# Patient Record
Sex: Female | Born: 1955 | Race: White | Hispanic: Yes | Marital: Married | Smoking: Never smoker
Health system: Southern US, Community
[De-identification: ages and names within clinical notes are randomized; demographics above are authoritative.]

## PROBLEM LIST (undated history)

## (undated) DIAGNOSIS — N870 Mild cervical dysplasia: Secondary | ICD-10-CM

## (undated) DIAGNOSIS — R7303 Prediabetes: Secondary | ICD-10-CM

## (undated) DIAGNOSIS — J189 Pneumonia, unspecified organism: Secondary | ICD-10-CM

## (undated) DIAGNOSIS — E039 Hypothyroidism, unspecified: Secondary | ICD-10-CM

## (undated) DIAGNOSIS — D649 Anemia, unspecified: Secondary | ICD-10-CM

## (undated) DIAGNOSIS — K602 Anal fissure, unspecified: Secondary | ICD-10-CM

## (undated) DIAGNOSIS — F32A Depression, unspecified: Secondary | ICD-10-CM

## (undated) DIAGNOSIS — G5600 Carpal tunnel syndrome, unspecified upper limb: Secondary | ICD-10-CM

## (undated) DIAGNOSIS — M8580 Other specified disorders of bone density and structure, unspecified site: Secondary | ICD-10-CM

## (undated) DIAGNOSIS — F329 Major depressive disorder, single episode, unspecified: Secondary | ICD-10-CM

## (undated) DIAGNOSIS — K635 Polyp of colon: Secondary | ICD-10-CM

## (undated) DIAGNOSIS — Z8619 Personal history of other infectious and parasitic diseases: Secondary | ICD-10-CM

## (undated) DIAGNOSIS — F419 Anxiety disorder, unspecified: Secondary | ICD-10-CM

## (undated) DIAGNOSIS — T7840XA Allergy, unspecified, initial encounter: Secondary | ICD-10-CM

## (undated) HISTORY — DX: Mild cervical dysplasia: N87.0

## (undated) HISTORY — DX: Hypothyroidism, unspecified: E03.9

## (undated) HISTORY — DX: Anemia, unspecified: D64.9

## (undated) HISTORY — DX: Polyp of colon: K63.5

## (undated) HISTORY — DX: Depression, unspecified: F32.A

## (undated) HISTORY — DX: Anal fissure, unspecified: K60.2

## (undated) HISTORY — DX: Pneumonia, unspecified organism: J18.9

## (undated) HISTORY — DX: Personal history of other infectious and parasitic diseases: Z86.19

## (undated) HISTORY — DX: Other specified disorders of bone density and structure, unspecified site: M85.80

## (undated) HISTORY — DX: Prediabetes: R73.03

## (undated) HISTORY — DX: Allergy, unspecified, initial encounter: T78.40XA

## (undated) HISTORY — PX: TONSILLECTOMY: SUR1361

## (undated) HISTORY — DX: Major depressive disorder, single episode, unspecified: F32.9

## (undated) HISTORY — DX: Anxiety disorder, unspecified: F41.9

## (undated) HISTORY — PX: APPENDECTOMY: SHX54

## (undated) HISTORY — PX: CRYOTHERAPY: SHX1416

## (undated) HISTORY — DX: Carpal tunnel syndrome, unspecified upper limb: G56.00

---

## 2001-04-07 HISTORY — PX: OTHER SURGICAL HISTORY: SHX169

## 2001-07-27 ENCOUNTER — Other Ambulatory Visit: Admission: RE | Admit: 2001-07-27 | Discharge: 2001-07-27 | Payer: Self-pay | Admitting: Obstetrics and Gynecology

## 2001-08-15 ENCOUNTER — Inpatient Hospital Stay (HOSPITAL_COMMUNITY): Admission: AD | Admit: 2001-08-15 | Discharge: 2001-08-15 | Payer: Self-pay | Admitting: Obstetrics and Gynecology

## 2001-09-14 ENCOUNTER — Encounter: Admission: RE | Admit: 2001-09-14 | Discharge: 2001-09-14 | Payer: Self-pay | Admitting: Obstetrics and Gynecology

## 2001-09-14 ENCOUNTER — Encounter: Payer: Self-pay | Admitting: Obstetrics and Gynecology

## 2001-10-28 ENCOUNTER — Other Ambulatory Visit: Admission: RE | Admit: 2001-10-28 | Discharge: 2001-10-28 | Payer: Self-pay | Admitting: Obstetrics and Gynecology

## 2002-02-10 ENCOUNTER — Other Ambulatory Visit: Admission: RE | Admit: 2002-02-10 | Discharge: 2002-02-10 | Payer: Self-pay | Admitting: Obstetrics and Gynecology

## 2002-05-17 ENCOUNTER — Other Ambulatory Visit: Admission: RE | Admit: 2002-05-17 | Discharge: 2002-05-17 | Payer: Self-pay | Admitting: *Deleted

## 2002-08-31 ENCOUNTER — Other Ambulatory Visit: Admission: RE | Admit: 2002-08-31 | Discharge: 2002-08-31 | Payer: Self-pay | Admitting: Gynecology

## 2002-10-28 ENCOUNTER — Encounter: Admission: RE | Admit: 2002-10-28 | Discharge: 2002-10-28 | Payer: Self-pay | Admitting: Gynecology

## 2002-10-28 ENCOUNTER — Encounter: Payer: Self-pay | Admitting: Gynecology

## 2003-02-10 ENCOUNTER — Other Ambulatory Visit: Admission: RE | Admit: 2003-02-10 | Discharge: 2003-02-10 | Payer: Self-pay | Admitting: Gynecology

## 2003-08-16 ENCOUNTER — Ambulatory Visit (HOSPITAL_BASED_OUTPATIENT_CLINIC_OR_DEPARTMENT_OTHER): Admission: RE | Admit: 2003-08-16 | Discharge: 2003-08-16 | Payer: Self-pay | Admitting: Gynecology

## 2003-08-16 ENCOUNTER — Encounter (INDEPENDENT_AMBULATORY_CARE_PROVIDER_SITE_OTHER): Payer: Self-pay | Admitting: Specialist

## 2003-08-16 ENCOUNTER — Ambulatory Visit (HOSPITAL_COMMUNITY): Admission: RE | Admit: 2003-08-16 | Discharge: 2003-08-16 | Payer: Self-pay | Admitting: Gynecology

## 2003-11-21 ENCOUNTER — Ambulatory Visit (HOSPITAL_COMMUNITY): Admission: RE | Admit: 2003-11-21 | Discharge: 2003-11-21 | Payer: Self-pay | Admitting: Gynecology

## 2004-02-14 ENCOUNTER — Other Ambulatory Visit: Admission: RE | Admit: 2004-02-14 | Discharge: 2004-02-14 | Payer: Self-pay | Admitting: Gynecology

## 2004-11-20 ENCOUNTER — Ambulatory Visit (HOSPITAL_COMMUNITY): Admission: RE | Admit: 2004-11-20 | Discharge: 2004-11-20 | Payer: Self-pay | Admitting: Gynecology

## 2004-12-25 ENCOUNTER — Other Ambulatory Visit: Admission: RE | Admit: 2004-12-25 | Discharge: 2004-12-25 | Payer: Self-pay | Admitting: Gynecology

## 2005-03-03 ENCOUNTER — Other Ambulatory Visit: Admission: RE | Admit: 2005-03-03 | Discharge: 2005-03-03 | Payer: Self-pay | Admitting: Gynecology

## 2005-03-26 ENCOUNTER — Ambulatory Visit (HOSPITAL_COMMUNITY): Admission: RE | Admit: 2005-03-26 | Discharge: 2005-03-26 | Payer: Self-pay | Admitting: Gastroenterology

## 2005-03-26 ENCOUNTER — Encounter (INDEPENDENT_AMBULATORY_CARE_PROVIDER_SITE_OTHER): Payer: Self-pay | Admitting: *Deleted

## 2005-03-26 LAB — HM COLONOSCOPY: HM Colonoscopy: ABNORMAL

## 2005-11-21 ENCOUNTER — Ambulatory Visit (HOSPITAL_COMMUNITY): Admission: RE | Admit: 2005-11-21 | Discharge: 2005-11-21 | Payer: Self-pay | Admitting: Gynecology

## 2006-03-04 ENCOUNTER — Other Ambulatory Visit: Admission: RE | Admit: 2006-03-04 | Discharge: 2006-03-04 | Payer: Self-pay | Admitting: Gynecology

## 2006-03-27 ENCOUNTER — Ambulatory Visit: Payer: Self-pay | Admitting: Family Medicine

## 2006-03-27 LAB — CONVERTED CEMR LAB
ALT: 40 units/L (ref 0–40)
AST: 26 units/L (ref 0–37)
Albumin: 4 g/dL (ref 3.5–5.2)
Alkaline Phosphatase: 56 units/L (ref 39–117)
BUN: 5 mg/dL — ABNORMAL LOW (ref 6–23)
Calcium: 9.1 mg/dL (ref 8.4–10.5)
Chloride: 105 meq/L (ref 96–112)
GFR calc non Af Amer: 113 mL/min
Glomerular Filtration Rate, Af Am: 137 mL/min/{1.73_m2}
Glucose, Bld: 92 mg/dL (ref 70–99)
HCT: 43.6 % (ref 36.0–46.0)
MCV: 98.3 fL (ref 78.0–100.0)
Monocytes Absolute: 0.5 10*3/uL (ref 0.2–0.7)
Neutro Abs: 3.3 10*3/uL (ref 1.4–7.7)
Neutrophils Relative %: 48.6 % (ref 43.0–77.0)
Rheumatoid Fact: <20 intl units/mL — ABNORMAL LOW (ref 0.0–20.0)
Sed Rate: 11 mm/hr (ref 0–25)
Sodium: 135 meq/L (ref 135–145)

## 2006-04-27 ENCOUNTER — Ambulatory Visit: Payer: Self-pay | Admitting: Family Medicine

## 2006-07-02 ENCOUNTER — Ambulatory Visit: Payer: Self-pay | Admitting: Family Medicine

## 2006-07-28 ENCOUNTER — Ambulatory Visit: Payer: Self-pay | Admitting: Family Medicine

## 2006-09-15 ENCOUNTER — Telehealth (INDEPENDENT_AMBULATORY_CARE_PROVIDER_SITE_OTHER): Payer: Self-pay | Admitting: *Deleted

## 2006-10-06 ENCOUNTER — Telehealth (INDEPENDENT_AMBULATORY_CARE_PROVIDER_SITE_OTHER): Payer: Self-pay | Admitting: *Deleted

## 2006-10-27 ENCOUNTER — Telehealth (INDEPENDENT_AMBULATORY_CARE_PROVIDER_SITE_OTHER): Payer: Self-pay | Admitting: *Deleted

## 2006-10-28 ENCOUNTER — Ambulatory Visit: Payer: Self-pay | Admitting: Family Medicine

## 2006-10-28 DIAGNOSIS — F329 Major depressive disorder, single episode, unspecified: Secondary | ICD-10-CM

## 2006-10-28 DIAGNOSIS — F419 Anxiety disorder, unspecified: Secondary | ICD-10-CM | POA: Insufficient documentation

## 2006-10-30 ENCOUNTER — Telehealth (INDEPENDENT_AMBULATORY_CARE_PROVIDER_SITE_OTHER): Payer: Self-pay | Admitting: *Deleted

## 2006-11-23 LAB — HM MAMMOGRAPHY: HM Mammogram: NORMAL

## 2006-11-25 ENCOUNTER — Ambulatory Visit (HOSPITAL_COMMUNITY): Admission: RE | Admit: 2006-11-25 | Discharge: 2006-11-25 | Payer: Self-pay | Admitting: Gynecology

## 2007-02-03 ENCOUNTER — Ambulatory Visit: Payer: Self-pay | Admitting: Family Medicine

## 2007-02-03 ENCOUNTER — Telehealth (INDEPENDENT_AMBULATORY_CARE_PROVIDER_SITE_OTHER): Payer: Self-pay | Admitting: *Deleted

## 2007-02-03 DIAGNOSIS — E039 Hypothyroidism, unspecified: Secondary | ICD-10-CM

## 2007-02-03 LAB — CONVERTED CEMR LAB: Glucose, Bld: 90 mg/dL (ref 70–99)

## 2007-02-11 ENCOUNTER — Other Ambulatory Visit: Admission: RE | Admit: 2007-02-11 | Discharge: 2007-02-11 | Payer: Self-pay | Admitting: Gynecology

## 2007-02-22 ENCOUNTER — Ambulatory Visit: Payer: Self-pay | Admitting: Family Medicine

## 2007-02-22 ENCOUNTER — Encounter (INDEPENDENT_AMBULATORY_CARE_PROVIDER_SITE_OTHER): Payer: Self-pay | Admitting: *Deleted

## 2007-03-03 ENCOUNTER — Ambulatory Visit: Payer: Self-pay | Admitting: Family Medicine

## 2007-03-03 DIAGNOSIS — B189 Chronic viral hepatitis, unspecified: Secondary | ICD-10-CM | POA: Insufficient documentation

## 2007-03-03 LAB — CONVERTED CEMR LAB
ALT: 17 units/L (ref 0–35)
AST: 17 units/L (ref 0–37)
Albumin: 3.9 g/dL (ref 3.5–5.2)
Total Bilirubin: 1.3 mg/dL — ABNORMAL HIGH (ref 0.3–1.2)
Total Protein: 7.4 g/dL (ref 6.0–8.3)

## 2007-03-08 ENCOUNTER — Telehealth (INDEPENDENT_AMBULATORY_CARE_PROVIDER_SITE_OTHER): Payer: Self-pay | Admitting: *Deleted

## 2007-03-08 LAB — CONVERTED CEMR LAB
HCV Ab: NEGATIVE
Hep B C IgM: NEGATIVE
Hep B Core Total Ab: NEGATIVE
Hep B S Ab: NEGATIVE
Hepatitis B Surface Ag: NEGATIVE

## 2007-04-26 ENCOUNTER — Telehealth (INDEPENDENT_AMBULATORY_CARE_PROVIDER_SITE_OTHER): Payer: Self-pay | Admitting: *Deleted

## 2007-05-08 ENCOUNTER — Ambulatory Visit: Payer: Self-pay | Admitting: Family Medicine

## 2007-05-10 ENCOUNTER — Telehealth (INDEPENDENT_AMBULATORY_CARE_PROVIDER_SITE_OTHER): Payer: Self-pay | Admitting: *Deleted

## 2007-05-10 ENCOUNTER — Encounter (INDEPENDENT_AMBULATORY_CARE_PROVIDER_SITE_OTHER): Payer: Self-pay | Admitting: *Deleted

## 2007-05-24 ENCOUNTER — Ambulatory Visit: Payer: Self-pay | Admitting: Family Medicine

## 2007-08-04 ENCOUNTER — Ambulatory Visit: Payer: Self-pay | Admitting: Internal Medicine

## 2007-08-04 DIAGNOSIS — R209 Unspecified disturbances of skin sensation: Secondary | ICD-10-CM

## 2007-08-04 DIAGNOSIS — J309 Allergic rhinitis, unspecified: Secondary | ICD-10-CM | POA: Insufficient documentation

## 2007-08-06 LAB — CONVERTED CEMR LAB
BUN: 5 mg/dL — ABNORMAL LOW (ref 6–23)
Basophils Absolute: 0 10*3/uL (ref 0.0–0.1)
Basophils Relative: 0.4 % (ref 0.0–1.0)
CO2: 29 meq/L (ref 19–32)
Calcium: 9 mg/dL (ref 8.4–10.5)
Chloride: 104 meq/L (ref 96–112)
Eosinophils Relative: 6.5 % — ABNORMAL HIGH (ref 0.0–5.0)
GFR calc Af Amer: 114 mL/min
Hemoglobin: 13.8 g/dL (ref 12.0–15.0)
Monocytes Absolute: 0.4 10*3/uL (ref 0.1–1.0)
Neutro Abs: 3.1 10*3/uL (ref 1.4–7.7)
Neutrophils Relative %: 50.9 % (ref 43.0–77.0)
Potassium: 4.4 meq/L (ref 3.5–5.1)
Rhuematoid fact SerPl-aCnc: <20 intl units/mL — ABNORMAL LOW (ref 0.0–20.0)
Sodium: 138 meq/L (ref 135–145)

## 2007-10-18 ENCOUNTER — Ambulatory Visit: Payer: Self-pay | Admitting: Internal Medicine

## 2007-10-18 ENCOUNTER — Telehealth (INDEPENDENT_AMBULATORY_CARE_PROVIDER_SITE_OTHER): Payer: Self-pay | Admitting: *Deleted

## 2007-10-18 LAB — CONVERTED CEMR LAB
Glucose, Urine, Semiquant: NEGATIVE
Nitrite: NEGATIVE
Protein, U semiquant: NEGATIVE
Specific Gravity, Urine: 1.025
Urobilinogen, UA: 0.2
WBC Urine, dipstick: NEGATIVE

## 2007-10-19 ENCOUNTER — Encounter: Payer: Self-pay | Admitting: Internal Medicine

## 2007-11-01 ENCOUNTER — Ambulatory Visit: Payer: Self-pay | Admitting: Internal Medicine

## 2007-11-01 ENCOUNTER — Encounter (INDEPENDENT_AMBULATORY_CARE_PROVIDER_SITE_OTHER): Payer: Self-pay | Admitting: *Deleted

## 2007-11-22 ENCOUNTER — Telehealth (INDEPENDENT_AMBULATORY_CARE_PROVIDER_SITE_OTHER): Payer: Self-pay | Admitting: *Deleted

## 2007-11-26 ENCOUNTER — Ambulatory Visit (HOSPITAL_COMMUNITY): Admission: RE | Admit: 2007-11-26 | Discharge: 2007-11-26 | Payer: Self-pay | Admitting: Gynecology

## 2007-11-26 ENCOUNTER — Telehealth (INDEPENDENT_AMBULATORY_CARE_PROVIDER_SITE_OTHER): Payer: Self-pay | Admitting: *Deleted

## 2007-11-26 ENCOUNTER — Ambulatory Visit: Payer: Self-pay | Admitting: Internal Medicine

## 2007-11-26 DIAGNOSIS — G4720 Circadian rhythm sleep disorder, unspecified type: Secondary | ICD-10-CM | POA: Insufficient documentation

## 2007-11-29 ENCOUNTER — Telehealth (INDEPENDENT_AMBULATORY_CARE_PROVIDER_SITE_OTHER): Payer: Self-pay | Admitting: *Deleted

## 2008-02-14 ENCOUNTER — Ambulatory Visit: Payer: Self-pay | Admitting: Gynecology

## 2008-02-15 ENCOUNTER — Telehealth (INDEPENDENT_AMBULATORY_CARE_PROVIDER_SITE_OTHER): Payer: Self-pay | Admitting: *Deleted

## 2008-02-16 ENCOUNTER — Ambulatory Visit (HOSPITAL_COMMUNITY): Admission: RE | Admit: 2008-02-16 | Discharge: 2008-02-16 | Payer: Self-pay | Admitting: Gynecology

## 2008-02-16 ENCOUNTER — Other Ambulatory Visit: Admission: RE | Admit: 2008-02-16 | Discharge: 2008-02-16 | Payer: Self-pay | Admitting: Gynecology

## 2008-02-16 ENCOUNTER — Ambulatory Visit: Payer: Self-pay | Admitting: Gynecology

## 2008-02-16 ENCOUNTER — Encounter: Payer: Self-pay | Admitting: Gynecology

## 2008-02-29 ENCOUNTER — Telehealth: Payer: Self-pay | Admitting: Internal Medicine

## 2008-03-10 ENCOUNTER — Telehealth (INDEPENDENT_AMBULATORY_CARE_PROVIDER_SITE_OTHER): Payer: Self-pay | Admitting: *Deleted

## 2008-03-14 ENCOUNTER — Telehealth (INDEPENDENT_AMBULATORY_CARE_PROVIDER_SITE_OTHER): Payer: Self-pay | Admitting: *Deleted

## 2008-03-14 ENCOUNTER — Encounter (INDEPENDENT_AMBULATORY_CARE_PROVIDER_SITE_OTHER): Payer: Self-pay | Admitting: *Deleted

## 2008-03-15 ENCOUNTER — Ambulatory Visit: Payer: Self-pay | Admitting: Internal Medicine

## 2008-03-15 DIAGNOSIS — M25559 Pain in unspecified hip: Secondary | ICD-10-CM

## 2008-04-07 HISTORY — PX: CHOLECYSTECTOMY: SHX55

## 2008-04-13 ENCOUNTER — Telehealth: Payer: Self-pay | Admitting: Internal Medicine

## 2008-04-14 ENCOUNTER — Telehealth (INDEPENDENT_AMBULATORY_CARE_PROVIDER_SITE_OTHER): Payer: Self-pay | Admitting: *Deleted

## 2008-04-18 ENCOUNTER — Encounter: Payer: Self-pay | Admitting: Internal Medicine

## 2008-04-20 ENCOUNTER — Telehealth (INDEPENDENT_AMBULATORY_CARE_PROVIDER_SITE_OTHER): Payer: Self-pay | Admitting: *Deleted

## 2008-04-21 ENCOUNTER — Ambulatory Visit: Payer: Self-pay | Admitting: Internal Medicine

## 2008-04-22 ENCOUNTER — Encounter: Payer: Self-pay | Admitting: Internal Medicine

## 2008-06-29 ENCOUNTER — Telehealth: Payer: Self-pay | Admitting: Internal Medicine

## 2008-07-14 ENCOUNTER — Ambulatory Visit: Payer: Self-pay | Admitting: Internal Medicine

## 2008-07-19 ENCOUNTER — Telehealth: Payer: Self-pay | Admitting: Internal Medicine

## 2008-09-05 ENCOUNTER — Ambulatory Visit: Payer: Self-pay | Admitting: Gynecology

## 2008-09-27 ENCOUNTER — Ambulatory Visit: Payer: Self-pay | Admitting: Internal Medicine

## 2008-09-28 ENCOUNTER — Encounter (INDEPENDENT_AMBULATORY_CARE_PROVIDER_SITE_OTHER): Payer: Self-pay | Admitting: *Deleted

## 2008-09-28 LAB — CONVERTED CEMR LAB: TSH: 2.95 u[IU]/mL

## 2008-11-02 ENCOUNTER — Ambulatory Visit: Payer: Self-pay | Admitting: Internal Medicine

## 2008-11-02 DIAGNOSIS — Z8719 Personal history of other diseases of the digestive system: Secondary | ICD-10-CM

## 2008-11-02 DIAGNOSIS — E739 Lactose intolerance, unspecified: Secondary | ICD-10-CM

## 2008-11-06 ENCOUNTER — Encounter: Admission: RE | Admit: 2008-11-06 | Discharge: 2008-11-06 | Payer: Self-pay | Admitting: Internal Medicine

## 2008-11-07 ENCOUNTER — Telehealth (INDEPENDENT_AMBULATORY_CARE_PROVIDER_SITE_OTHER): Payer: Self-pay | Admitting: *Deleted

## 2008-11-07 ENCOUNTER — Encounter (INDEPENDENT_AMBULATORY_CARE_PROVIDER_SITE_OTHER): Payer: Self-pay | Admitting: *Deleted

## 2008-11-07 LAB — CONVERTED CEMR LAB
Amylase: 117 units/L (ref 27–131)
Basophils Absolute: 0 10*3/uL (ref 0.0–0.1)
Bilirubin, Direct: 0.2 mg/dL (ref 0.0–0.3)
Eosinophils Absolute: 0.2 10*3/uL (ref 0.0–0.7)
HCT: 38.1 % (ref 36.0–46.0)
Lipase: 15 units/L (ref 11.0–59.0)
Lymphs Abs: 1.4 10*3/uL (ref 0.7–4.0)
MCV: 96.7 fL (ref 78.0–100.0)
Monocytes Absolute: 0.2 10*3/uL (ref 0.1–1.0)
Neutrophils Relative %: 28.4 % — ABNORMAL LOW (ref 43.0–77.0)
Platelets: 199 10*3/uL (ref 150.0–400.0)
RDW: 13.7 % (ref 11.5–14.6)
Total Bilirubin: 1.1 mg/dL (ref 0.3–1.2)

## 2008-11-08 ENCOUNTER — Telehealth (INDEPENDENT_AMBULATORY_CARE_PROVIDER_SITE_OTHER): Payer: Self-pay | Admitting: *Deleted

## 2008-11-08 ENCOUNTER — Telehealth: Payer: Self-pay | Admitting: Internal Medicine

## 2008-11-20 ENCOUNTER — Encounter: Payer: Self-pay | Admitting: Internal Medicine

## 2008-11-22 ENCOUNTER — Telehealth: Payer: Self-pay | Admitting: Internal Medicine

## 2008-11-29 ENCOUNTER — Ambulatory Visit (HOSPITAL_COMMUNITY): Admission: RE | Admit: 2008-11-29 | Discharge: 2008-11-29 | Payer: Self-pay | Admitting: Gynecology

## 2008-11-29 ENCOUNTER — Encounter: Payer: Self-pay | Admitting: Internal Medicine

## 2008-12-04 ENCOUNTER — Telehealth (INDEPENDENT_AMBULATORY_CARE_PROVIDER_SITE_OTHER): Payer: Self-pay | Admitting: *Deleted

## 2009-01-18 ENCOUNTER — Encounter (INDEPENDENT_AMBULATORY_CARE_PROVIDER_SITE_OTHER): Payer: Self-pay | Admitting: Surgery

## 2009-01-18 ENCOUNTER — Ambulatory Visit (HOSPITAL_COMMUNITY): Admission: RE | Admit: 2009-01-18 | Discharge: 2009-01-18 | Payer: Self-pay | Admitting: Surgery

## 2009-02-15 ENCOUNTER — Encounter: Payer: Self-pay | Admitting: Internal Medicine

## 2009-02-15 ENCOUNTER — Other Ambulatory Visit: Admission: RE | Admit: 2009-02-15 | Discharge: 2009-02-15 | Payer: Self-pay | Admitting: Gynecology

## 2009-02-15 ENCOUNTER — Ambulatory Visit: Payer: Self-pay | Admitting: Gynecology

## 2009-02-15 ENCOUNTER — Encounter: Payer: Self-pay | Admitting: Gynecology

## 2009-03-07 ENCOUNTER — Encounter: Payer: Self-pay | Admitting: Internal Medicine

## 2009-03-12 ENCOUNTER — Telehealth (INDEPENDENT_AMBULATORY_CARE_PROVIDER_SITE_OTHER): Payer: Self-pay | Admitting: *Deleted

## 2009-03-21 ENCOUNTER — Emergency Department (HOSPITAL_COMMUNITY): Admission: EM | Admit: 2009-03-21 | Discharge: 2009-03-21 | Payer: Self-pay | Admitting: Emergency Medicine

## 2009-03-27 ENCOUNTER — Telehealth (INDEPENDENT_AMBULATORY_CARE_PROVIDER_SITE_OTHER): Payer: Self-pay | Admitting: *Deleted

## 2009-05-02 ENCOUNTER — Ambulatory Visit: Payer: Self-pay | Admitting: Gynecology

## 2009-05-28 ENCOUNTER — Telehealth (INDEPENDENT_AMBULATORY_CARE_PROVIDER_SITE_OTHER): Payer: Self-pay | Admitting: *Deleted

## 2009-07-13 ENCOUNTER — Encounter: Admission: RE | Admit: 2009-07-13 | Discharge: 2009-07-13 | Payer: Self-pay | Admitting: Neurosurgery

## 2009-09-05 ENCOUNTER — Ambulatory Visit: Payer: Self-pay | Admitting: Internal Medicine

## 2009-09-05 DIAGNOSIS — G4700 Insomnia, unspecified: Secondary | ICD-10-CM

## 2009-09-05 DIAGNOSIS — M549 Dorsalgia, unspecified: Secondary | ICD-10-CM

## 2009-09-06 ENCOUNTER — Ambulatory Visit: Payer: Self-pay | Admitting: Family Medicine

## 2009-09-06 ENCOUNTER — Telehealth (INDEPENDENT_AMBULATORY_CARE_PROVIDER_SITE_OTHER): Payer: Self-pay | Admitting: *Deleted

## 2009-09-06 LAB — CONVERTED CEMR LAB
Albumin: 4.2 g/dL (ref 3.5–5.2)
Alkaline Phosphatase: 70 units/L (ref 39–117)
Calcium: 9.3 mg/dL (ref 8.4–10.5)
Cholesterol: 195 mg/dL (ref 0–200)
Eosinophils Absolute: 0.1 10*3/uL (ref 0.0–0.7)
Eosinophils Relative: 2.6 % (ref 0.0–5.0)
GFR calc non Af Amer: 109.12 mL/min (ref >60)
Glucose, Bld: 93 mg/dL (ref 70–99)
HCT: 38.7 % (ref 36.0–46.0)
HDL: 36.1 mg/dL — ABNORMAL LOW (ref >39.00)
Lymphs Abs: 2.5 10*3/uL (ref 0.7–4.0)
MCHC: 34.3 g/dL (ref 30.0–36.0)
MCV: 95.2 fL (ref 78.0–100.0)
Monocytes Absolute: 0.4 10*3/uL (ref 0.1–1.0)
Platelets: 183 10*3/uL (ref 150.0–400.0)
RDW: 13.5 % (ref 11.5–14.6)
Sodium: 142 meq/L (ref 135–145)
TSH: 2.2 microintl units/mL (ref 0.35–5.50)
Total Bilirubin: 1.2 mg/dL (ref 0.3–1.2)
Triglycerides: 89 mg/dL (ref 0.0–149.0)
WBC: 5.5 10*3/uL (ref 4.5–10.5)

## 2009-09-11 ENCOUNTER — Ambulatory Visit: Payer: Self-pay | Admitting: Internal Medicine

## 2009-09-12 ENCOUNTER — Telehealth: Payer: Self-pay | Admitting: Internal Medicine

## 2009-10-16 ENCOUNTER — Ambulatory Visit: Payer: Self-pay | Admitting: Internal Medicine

## 2009-10-19 LAB — CONVERTED CEMR LAB
Potassium: 5.2 meq/L — ABNORMAL HIGH (ref 3.5–5.1)
TSH: 1.12 microintl units/mL (ref 0.35–5.50)

## 2009-10-26 ENCOUNTER — Telehealth: Payer: Self-pay | Admitting: Internal Medicine

## 2009-11-28 ENCOUNTER — Ambulatory Visit: Payer: Self-pay | Admitting: Internal Medicine

## 2009-11-30 ENCOUNTER — Ambulatory Visit (HOSPITAL_COMMUNITY): Admission: RE | Admit: 2009-11-30 | Discharge: 2009-11-30 | Payer: Self-pay | Admitting: Gynecology

## 2010-01-02 ENCOUNTER — Emergency Department (HOSPITAL_COMMUNITY): Admission: EM | Admit: 2010-01-02 | Discharge: 2010-01-02 | Payer: Self-pay | Admitting: Emergency Medicine

## 2010-01-17 ENCOUNTER — Ambulatory Visit: Payer: Self-pay | Admitting: Family Medicine

## 2010-01-18 ENCOUNTER — Telehealth: Payer: Self-pay | Admitting: Internal Medicine

## 2010-02-14 ENCOUNTER — Ambulatory Visit: Payer: Self-pay | Admitting: Internal Medicine

## 2010-02-15 LAB — CONVERTED CEMR LAB
Calcium: 9.6 mg/dL (ref 8.4–10.5)
Chloride: 107 meq/L (ref 96–112)
Creatinine, Ser: 0.6 mg/dL (ref 0.4–1.2)

## 2010-02-21 ENCOUNTER — Ambulatory Visit: Payer: Self-pay | Admitting: Gynecology

## 2010-02-21 ENCOUNTER — Other Ambulatory Visit: Admission: RE | Admit: 2010-02-21 | Discharge: 2010-02-21 | Payer: Self-pay | Admitting: Gynecology

## 2010-02-21 ENCOUNTER — Telehealth: Payer: Self-pay | Admitting: Internal Medicine

## 2010-04-02 ENCOUNTER — Ambulatory Visit: Payer: Self-pay | Admitting: Gynecology

## 2010-04-03 ENCOUNTER — Ambulatory Visit
Admission: RE | Admit: 2010-04-03 | Discharge: 2010-04-03 | Payer: Self-pay | Source: Home / Self Care | Attending: Gynecology | Admitting: Gynecology

## 2010-04-24 ENCOUNTER — Encounter: Payer: Self-pay | Admitting: Internal Medicine

## 2010-04-25 ENCOUNTER — Emergency Department (HOSPITAL_COMMUNITY)
Admission: EM | Admit: 2010-04-25 | Discharge: 2010-04-25 | Payer: Self-pay | Source: Home / Self Care | Admitting: Emergency Medicine

## 2010-04-25 ENCOUNTER — Encounter: Payer: Self-pay | Admitting: Internal Medicine

## 2010-04-28 ENCOUNTER — Encounter: Payer: Self-pay | Admitting: Gynecology

## 2010-04-28 ENCOUNTER — Encounter: Payer: Self-pay | Admitting: Neurosurgery

## 2010-04-29 ENCOUNTER — Encounter: Payer: Self-pay | Admitting: Internal Medicine

## 2010-04-29 ENCOUNTER — Ambulatory Visit: Payer: Self-pay | Admitting: Cardiology

## 2010-04-29 ENCOUNTER — Ambulatory Visit
Admission: RE | Admit: 2010-04-29 | Discharge: 2010-04-29 | Payer: Self-pay | Source: Home / Self Care | Attending: Internal Medicine | Admitting: Internal Medicine

## 2010-04-29 DIAGNOSIS — R42 Dizziness and giddiness: Secondary | ICD-10-CM | POA: Insufficient documentation

## 2010-04-29 LAB — URINALYSIS, ROUTINE W REFLEX MICROSCOPIC
Ketones, ur: NEGATIVE mg/dL
Nitrite: NEGATIVE
Protein, ur: NEGATIVE mg/dL
Specific Gravity, Urine: 1.006 (ref 1.005–1.030)
Urine Glucose, Fasting: NEGATIVE mg/dL
Urobilinogen, UA: 0.2 mg/dL (ref 0.0–1.0)

## 2010-04-29 LAB — DIFFERENTIAL
Basophils Relative: 1 % (ref 0–1)
Eosinophils Absolute: 0.1 10*3/uL (ref 0.0–0.7)
Lymphocytes Relative: 40 % (ref 12–46)
Lymphs Abs: 2.4 10*3/uL (ref 0.7–4.0)
Neutro Abs: 3 10*3/uL (ref 1.7–7.7)
Neutrophils Relative %: 51 % (ref 43–77)

## 2010-04-29 LAB — COMPREHENSIVE METABOLIC PANEL
ALT: 21 U/L (ref 0–35)
Albumin: 4 g/dL (ref 3.5–5.2)
CO2: 26 mEq/L (ref 19–32)
Calcium: 9.3 mg/dL (ref 8.4–10.5)
Creatinine, Ser: 0.56 mg/dL (ref 0.4–1.2)
GFR calc Af Amer: >60 mL/min (ref >60)
GFR calc non Af Amer: >60 mL/min (ref >60)
Total Bilirubin: 1 mg/dL (ref 0.3–1.2)

## 2010-04-29 LAB — TROPONIN I: Troponin I: 0.02 ng/mL (ref 0.00–0.06)

## 2010-04-29 LAB — CBC
HCT: 39.5 % (ref 36.0–46.0)
Platelets: 209 10*3/uL (ref 150–400)
RBC: 4.23 MIL/uL (ref 3.87–5.11)

## 2010-04-29 LAB — CK TOTAL AND CKMB (NOT AT ARMC): Relative Index: 1.1 (ref 0.0–2.5)

## 2010-04-30 ENCOUNTER — Telehealth: Payer: Self-pay | Admitting: Internal Medicine

## 2010-05-07 NOTE — Assessment & Plan Note (Signed)
Summary: TO DISCUSS POTASSIUM AND KIDNEY ISSUES////SPH   Vital Signs:  Patient profile:   55 year old female Weight:      138.38 pounds Pulse rate:   71 / minute Pulse rhythm:   regular BP sitting:   116 / 74  (left arm) Cuff size:   regular  Vitals Entered By: Army Fossa CMA (November 28, 2009 3:47 PM) CC: Pt here to discuss Potassium (high in the past), also discuss Kidney functions.    History of Present Illness: multiple issues Very anxious and  concerned about the health of her husband (he is my patient as well) Concern about her history of high potassium and wonders about her kidney function Having again numbness in the left hand, in the past it responded well to the use of a carpal tunnel splinter Alprazolam helped  with insomnia, refill ? Occasional pain at the right third finger, DJD?   Current Medications (verified): 1)  Citalopram Hydrobromide 40 Mg Tabs (Citalopram Hydrobromide) .Marland Kitchen.. 1 Once Daily 2)  Levothyroxine Sodium 50 Mcg Tabs (Levothyroxine Sodium) .... Take 1 Tab Once Daily 3)  Calcium Antacid Ultra 1000 Mg  Chew (Calcium Carbonate Antacid) 4)  Alprazolam 0.5 Mg Tabs (Alprazolam) .... One or 2 By Mouth At Bedtime As Needed For Insomnia  Allergies: 1)  ! Sulfa 2)  ! Ceftriaxone Sodium (Ceftriaxone Sodium)  Past History:  Past Medical History: Reviewed history from 09/27/2008 and no changes required. Depression Hypothyroidism Allergic rhinitis CTS  Past Surgical History: Reviewed history from 09/05/2009 and no changes required. Cholecystectomy Tonsillectomy uterine polyps aprox 2003 , Dr Lily Peer   Family History: DM-- uncle, brother, GM MI--no Colon ca-- no breast  ca-- distant cousins  Willms tumor-- niece  Social History: Reviewed history from 09/05/2009 and no changes required. original from [place] no children  married at age 3 no tobacco no ETOH  Physical Exam  General:  alert and well-developed.   Extremities:  hands and  wrists inspection and palpation normal except for mild enlargement of the DIP joints   Impression & Recommendations:  Problem # 1:  INSOMNIA-SLEEP DISORDER-UNSPEC (ICD-780.52) refill alprazolam  Problem # 2:  PARESTHESIA, HANDS (ICD-782.0) retry carpal tunnel syndrome splinter  Problem # 3:  DEPRESSION (ICD-311) history of depression and now also anxiety long discussion and counseling  in reference to her husband's health Her updated medication list for this problem includes:    Citalopram Hydrobromide 40 Mg Tabs (Citalopram hydrobromide) .Marland Kitchen... 1 once daily    Alprazolam 0.5 Mg Tabs (Alprazolam) ..... One or 2 by mouth at bedtime as needed for insomnia  Problem # 4:  HYPERKALEMIA (ICD-276.7) all  labs discussed plan to recheck her potassium in December  Problem # 5:  face to face more than 20 minutes, > 50% of the time counseling  Complete Medication List: 1)  Citalopram Hydrobromide 40 Mg Tabs (Citalopram hydrobromide) .Marland Kitchen.. 1 once daily 2)  Levothyroxine Sodium 50 Mcg Tabs (Levothyroxine sodium) .... Take 1 tab once daily 3)  Calcium Antacid Ultra 1000 Mg Chew (Calcium carbonate antacid) 4)  Alprazolam 0.5 Mg Tabs (Alprazolam) .... One or 2 by mouth at bedtime as needed for insomnia Prescriptions: ALPRAZOLAM 0.5 MG TABS (ALPRAZOLAM) one or 2 by mouth at bedtime as needed for insomnia  #60 x 1   Entered and Authorized by:   Elita Quick E. Paz MD   Signed by:   Nolon Rod. Paz MD on 11/28/2009   Method used:   Print then Give to Patient  RxID:   [id number]

## 2010-05-07 NOTE — Assessment & Plan Note (Signed)
Summary: followup/kn   Vital Signs:  Patient profile:   55 year old female Weight:      136.50 pounds Pulse rate:   84 / minute Pulse rhythm:   regular BP sitting:   130 / 80  (left arm) Cuff size:   regular  Vitals Entered By: Army Fossa CMA (February 14, 2010 8:45 AM) CC: Follow up visit-fasting Comments Abilify not helping for her depression.  [facility] [address]    History of Present Illness: followup from last office visit, she was seen with increased depression and anxiety. She was given samples of Abilify which she took for several days. She cannot tolerate Abilify , she felt "hyperactive", couldn't sleep.  Review of systems  she continue with increase anxiety and depression. On describing  her symptoms , she feels down, sad. No crying, not suicidal Her main worry remains her husband 's health of her stressful job. She has made some mistakes at work and is worried about losing her job   Current Medications (verified): 1)  Celexa 40 Mg Tabs (Citalopram Hydrobromide) .Marland Kitchen.. 1 By Mouth Once Daily 2)  Levothyroxine Sodium 50 Mcg Tabs (Levothyroxine Sodium) .... Take 1 Tab Once Daily 3)  Calcium Antacid Ultra 1000 Mg  Chew (Calcium Carbonate Antacid) 4)  Alprazolam 0.5 Mg Tabs (Alprazolam) .... One or 2 By Mouth At Bedtime As Needed For Insomnia 5)  Elestrin 0.52 Mg/0.87 Gm (0.06%) Gel (Estradiol)  Allergies (verified): 1)  ! Sulfa 2)  ! Ceftriaxone Sodium (Ceftriaxone Sodium)  Past History:  Past Medical History: Reviewed history from 09/27/2008 and no changes required. Depression Hypothyroidism Allergic rhinitis CTS  Past Surgical History: Reviewed history from 09/05/2009 and no changes required. Cholecystectomy Tonsillectomy uterine polyps aprox 2003 , Dr Lily Peer   Social History: Reviewed history from 09/05/2009 and no changes required. original from [place] no children  married at age 79 no tobacco no ETOH  Physical Exam  General:  alert and  well-developed.   Psych:  Oriented X3, normally interactive, good eye contact, and not anxious appearing.  seems moderately anxious, no depressed   Impression & Recommendations:  Problem # 1:  DEPRESSION (ICD-311) continue with depression and anxiety, intolerant to Abilify She was referred to psychiatry and has an appointment with Dr. Evelene Croon March 16, 2010 In the meantime I feel that she needs help. I counseled her for more than 25 minutes today, explained that no medication will help her husband's health or her stressful job but medication and counseling will improve the way she deals with problems states she is unable to see a counselor due to lack of time  Plan letter to psych  Switch from Celexa to Zoloft ( because she is feeling " down" and not motivated to do much, so zoloft may work better for her) keep appointment with Dr. Evelene Croon  The following medications were removed from the medication list:    Celexa 40 Mg Tabs (Citalopram hydrobromide) .Marland Kitchen... 1 by mouth once daily Her updated medication list for this problem includes:    Alprazolam 0.5 Mg Tabs (Alprazolam) ..... One or 2 by mouth at bedtime as needed for insomnia    Sertraline Hcl 100 Mg Tabs (Sertraline hcl) .Marland Kitchen... As  directed  Problem # 2:  HYPERKALEMIA (ICD-276.7)  labs  Orders: Venipuncture (91478) Specimen Handling (29562) TLB-BMP (Basic Metabolic Panel-BMET) (80048-METABOL)  Problem # 3:  F2F >> 25 min, > 50% of time counseling   Complete Medication List: 1)  Levothyroxine Sodium 50 Mcg Tabs (Levothyroxine sodium) .Marland KitchenMarland KitchenMarland Kitchen  Take 1 tab once daily 2)  Calcium Antacid Ultra 1000 Mg Chew (Calcium carbonate antacid) 3)  Alprazolam 0.5 Mg Tabs (Alprazolam) .... One or 2 by mouth at bedtime as needed for insomnia 4)  Elestrin 0.52 Mg/0.87 Gm (0.06%) Gel (Estradiol) 5)  Sertraline Hcl 100 Mg Tabs (Sertraline hcl) .... As  directed  Patient Instructions: 1)  for one week, takes Celexa 20 mg daily and sertraline 100 mg  tablet  half tablet daily 2)  After one week, discontinue Celexa and take sertraline 100 mg tablet one daily 3)  keep your appointment with psychiatry 4)  Call if  severe side effects or problems 5)  Please schedule a follow-up appointment in 3 months .  Prescriptions: SERTRALINE HCL 100 MG TABS (SERTRALINE HCL) as  directed  #30 x 1   Entered and Authorized by:   Elita Quick E. Paz MD   Signed by:   Nolon Rod. Paz MD on 02/14/2010   Method used:   Electronically to        [facility] [address] (retail)       [phone]       [place], [place]  [address]       Ph: [phone]       Fax: [phone]   RxID:   [id number]    Orders Added: 1)  Venipuncture [09323] 2)  Specimen Handling [99000] 3)  TLB-BMP (Basic Metabolic Panel-BMET) [80048-METABOL] 4)  Est. Patient Level IV [55732]

## 2010-05-07 NOTE — Progress Notes (Signed)
Summary: Request for Labs--better, recheck in 5 weeks  Phone Note Call from Patient Call back at Work Phone [phone]   Caller: Patient Summary of Call: Message left on VM: Patient would like lab results:   Dr.Hopper please advise on labs.Shonna Chock  September 12, 2009 10:08 AM   Follow-up for Phone Call        she sees Dr Drue Novel Follow-up by: Marga Melnick MD,  September 12, 2009 5:08 PM  Additional Follow-up for Phone Call Additional follow up Details #1::        I tried to contact her at her job earlier today, unable to talk to her Dearing E. Meredyth Hornung MD  September 13, 2009 6:24 PM   LMOM: potassium is essentially normal, continue low potassium diet, will recheck a K+ in 5 weeks when she comes back for her TSH Shariah Assad E. Jadon Harbaugh MD  September 17, 2009 12:44 PM

## 2010-05-07 NOTE — Progress Notes (Signed)
Summary: Levothyroxine refill  Phone Note Refill Request Message from:  Patient  Refills Requested: Medication #1:  LEVOTHYROXINE SODIUM 50 MCG TABS Take 1 tab once daily [facility] [address] [facility] - 90 DAY SUPPLY   Initial call taken by: Okey Regal [place],  October 26, 2009 12:00 PM    Prescriptions: LEVOTHYROXINE SODIUM 50 MCG TABS (LEVOTHYROXINE SODIUM) Take 1 tab once daily  #30 x 5   Entered by:   Lucious Groves CMA   Authorized by:   Nolon Rod. Elenor Wildes MD   Signed by:   Lucious Groves CMA on 10/26/2009   Method used:   Electronically to        [facility] (retail)       [address]       [place], [place]  [address]       Ph: [phone]       Fax: [phone]   RxID:   [id number]

## 2010-05-07 NOTE — Progress Notes (Signed)
Summary: lab result  Phone Note Outgoing Call   Call placed by: [facility] CMA,  September 06, 2009 3:38 PM Details for Reason: (potassium slightly elevated, not on any medication that could account for that) advised patient potassium elevated, recheck next week. DX hyperkalemia cholesterol okay could be better, recommend a low-fat diet and stay active Increase Synthroid from 25 to , TSH in 6 weeks  Summary of Call: left message to call office...Marland KitchenMarland KitchenFelecia Deloach CMA  September 06, 2009 3:38 PM   Follow-up for Phone Call        pt aware, appt scheduled, rx sent to pharmacy....Marland KitchenMarland KitchenFelecia Deloach CMA  September 07, 2009 8:49 AM     New/Updated Medications: LEVOTHYROXINE SODIUM 50 MCG TABS (LEVOTHYROXINE SODIUM) Take 1 tab once daily Prescriptions: LEVOTHYROXINE SODIUM 50 MCG TABS (LEVOTHYROXINE SODIUM) Take 1 tab once daily  #30 x 1   Entered by:   Jeremy Johann CMA   Authorized by:   Nolon Rod. Paz MD   Signed by:   Jeremy Johann CMA on 09/07/2009   Method used:   Faxed to ...       [facility] (retail)       [address]       [place], [place]  [address]       Ph: [phone]       Fax: [phone]   RxID:   [id number]

## 2010-05-07 NOTE — Letter (Signed)
Summary: Cancer Screening/[facility] Personalized Risk Profile  Cancer Screening/[facility] Personalized Risk Profile   Imported By: Lanelle Bal 09/13/2009 12:01:20  _____________________________________________________________________  External Attachment:    Type:   Image     Comment:   External Document

## 2010-05-07 NOTE — Letter (Signed)
Summary: *Referral Letter  [facility]  [address] [place], [place] [address]   Phone: [phone]  Fax: [phone]    02/14/2010  Dr Milagros Evener   Thank you in advance for agreeing to see my patient:  [facility] [address] [place], [place]  [address]  Phone: [phone]  Reason for Referral:  very nice 55 y/o lady with history of depression-anxiety , originally on Lexapro 20mg  started in 2007, switched to citalopram due to cost in 2009. She was stable on such medicine until few weeks ago. She has been seen 3 times recently due to increase anxiety and depression related to the health of her husband and her stresful job. She was added Abilify to citalopram but she d/c it due to severe insomnia and feeling "hyper". She is seen today and we are changing from citalopram 40mg   to sertraline 100mg  1 by mouth  once daily until she sees you  in December     Current Medications: 1)  LEVOTHYROXINE SODIUM 50 MCG TABS (LEVOTHYROXINE SODIUM) Take 1 tab once daily 2)  CALCIUM ANTACID ULTRA 1000 MG  CHEW (CALCIUM CARBONATE ANTACID)  3)  ALPRAZOLAM 0.5 MG TABS (ALPRAZOLAM) one or 2 by mouth at bedtime as needed for insomnia 4)  ELESTRIN 0.52 MG/0.87 GM (0.06%) GEL (ESTRADIOL)  5)  SERTRALINE HCL 100 MG TABS (SERTRALINE HCL) as  directed   Past Medical History: 1)  Depression 2)  Hypothyroidism 3)  Allergic rhinitis 4)  CTS   Thank you again for agreeing to see our patient; please contact us if you have any further questions or need additional information.  Sincerely,     Luciano Cinquemani E. Jaiyon Wander MD
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
## 2010-05-07 NOTE — Progress Notes (Signed)
Summary: Genetic profile  Phone Note Other Incoming   Summary of Call: Received voicemail from Lupita Leash at Dr. Kenna Gilbert office requesting patient genetic profile that recommends/warrants colonoscopy or notes patient as being high risk for colon cancer.  Office phone [phone] Office fax  [phone]  I faxed patient [employer] profile paperwork. Initial call taken by: Lucious Groves CMA,  February 21, 2010 2:53 PM

## 2010-05-07 NOTE — Assessment & Plan Note (Signed)
Summary: DEPRESSED, "LOSING HER MIND"///SPH   Vital Signs:  Patient profile:   55 year old female Weight:      136.8 pounds Temp:     98.4 degrees F oral Pulse rate:   80 / minute Pulse rhythm:   regular BP sitting:   110 / 70  (right arm) Cuff size:   regular  Vitals Entered By: Almeta Monas CMA Duncan Dull) (January 17, 2010 1:55 PM) CC: c/o increased stress, depression and memory loss , Depressive symptoms   History of Present Illness:  Depressive symptoms      This is a 55 year old woman who presents with Depressive symptoms.  The symptoms began 6-12 months ago.  The patient reports depressed mood, loss of interest/pleasure, and insomnia, but denies significant weight gain, hypersomnia, psychomotor agitation, and psychomotor retardation.  The patient also reports fatigue or loss of energy, feelings of worthlessness, and diminished concentration.  The patient denies indecisiveness, thoughts of death, thoughts of suicide, suicidal intent, and suicidal plans.  Patient's past history includes family hx of alcoholism and family hx of depression.  The patient denies abnormally elevated mood, abnormally irritable mood, decreased need for sleep, increased talkativeness, distractibility, flight of ideas, increased goal-directed activity, and inflated self-esteem/ grandiosity.    Preventive Screening-Counseling & Management  Alcohol-Tobacco     Alcohol drinks/day: 0     Smoking Status: never  Caffeine-Diet-Exercise     Does Patient Exercise: no  Current Medications (verified): 1)  Celexa 40 Mg Tabs (Citalopram Hydrobromide) .Marland Kitchen.. 1 By Mouth Once Daily 2)  Levothyroxine Sodium 50 Mcg Tabs (Levothyroxine Sodium) .... Take 1 Tab Once Daily 3)  Calcium Antacid Ultra 1000 Mg  Chew (Calcium Carbonate Antacid) 4)  Alprazolam 0.5 Mg Tabs (Alprazolam) .... One or 2 By Mouth At Bedtime As Needed For Insomnia 5)  Abilify 2 Mg Tabs (Aripiprazole) .Marland Kitchen.. 1 By Mouth At Bedtime For 1 Week 6)  Abilify 5 Mg  Tabs (Aripiprazole) .... After Finishing 2 Mg Take 5 Mg Once Daily  Allergies (verified): 1)  ! Sulfa 2)  ! Ceftriaxone Sodium (Ceftriaxone Sodium)  Past History:  Past medical, surgical, family and social histories (including risk factors) reviewed for relevance to current acute and chronic problems.  Past Medical History: Reviewed history from 09/27/2008 and no changes required. Depression Hypothyroidism Allergic rhinitis CTS  Past Surgical History: Reviewed history from 09/05/2009 and no changes required. Cholecystectomy Tonsillectomy uterine polyps aprox 2003 , Dr Lily Peer   Family History: Reviewed history from 11/28/2009 and no changes required. DM-- uncle, brother, GM MI--no Colon ca-- no breast  ca-- distant cousins  Willms tumor-- niece  Social History: Reviewed history from 09/05/2009 and no changes required. original from [place] no children  married at age 5 no tobacco no ETOH  Review of Systems      See HPI  Physical Exam  General:  Well-developed,well-nourished,in no acute distress; alert,appropriate and cooperative throughout examination Psych:  Oriented X3, depressed affect, tearful, and severely anxious.     Impression & Recommendations:  Problem # 1:  DEPRESSION (ICD-311) Assessment Deteriorated Pt here for > 25 min  Her updated medication list for this problem includes:    Celexa 40 Mg Tabs (Citalopram hydrobromide) .Marland Kitchen... 1 by mouth once daily    Alprazolam 0.5 Mg Tabs (Alprazolam) ..... One or 2 by mouth at bedtime as needed for insomnia     add abilify 2 mg for 1 week then 5 mg once daily ----rto 2 weeks --we will con't 5 mg  if working Make appointment with Dr Evelene Croon   Discussed treatment options, including trial of antidpressant medication. Will refer to behavioral health. Follow-up call in in 24-48 hours and recheck in 2 weeks, sooner as needed. Patient agrees to call if any worsening of symptoms or thoughts of doing harm arise. Verified  that the patient has no suicidal ideation at this time.   Complete Medication List: 1)  Celexa 40 Mg Tabs (Citalopram hydrobromide) .Marland Kitchen.. 1 by mouth once daily 2)  Levothyroxine Sodium 50 Mcg Tabs (Levothyroxine sodium) .... Take 1 tab once daily 3)  Calcium Antacid Ultra 1000 Mg Chew (Calcium carbonate antacid) 4)  Alprazolam 0.5 Mg Tabs (Alprazolam) .... One or 2 by mouth at bedtime as needed for insomnia 5)  Abilify 2 Mg Tabs (Aripiprazole) .Marland Kitchen.. 1 by mouth at bedtime for 1 week 6)  Abilify 5 Mg Tabs (Aripiprazole) .... After finishing 2 mg take 5 mg once daily  Patient Instructions: 1)  rto  2 weeks  2)  take 2 mg abilify every night for 1 week then take 5 mg each night Prescriptions: CELEXA 40 MG TABS (CITALOPRAM HYDROBROMIDE) 1 by mouth once daily  #30 x 0   Entered and Authorized by:   Loreen Freud DO   Signed by:   Loreen Freud DO on 01/17/2010   Method used:   Print then Give to Patient   RxID:   [id number]

## 2010-05-07 NOTE — Progress Notes (Signed)
Summary: trouble sleeping  Phone Note Call from Patient Call back at Home Phone [phone]   Caller: Patient Summary of Call: Patient states she has not been able to sleep for 3 days. Patient is requesting is some meds to help her sleep. Offered patient appt with Dr. Laury Axon she stated she cant come in at that time to early. Please advise Initial call taken by: Barb Merino,  May 28, 2009 12:12 PM  Follow-up for Phone Call        left message to call  office.............Marland KitchenFelecia Deloach CMA  May 28, 2009 12:32 PM   pt called back, offer pt appt with dr Laury Axon today pt decline stating that she was not familiar with that physician and would prefer to see dr hopper, advise pt dr hopper has no appt for today offer OV for tomorrow pt decline stating that husband has appt today with dr Drue Novel and she was hoping she could be seen as well. pt now does not want appt but would like to see if a med can be rx with out OV. pls advise..............Marland KitchenFelecia Deloach CMA  May 28, 2009 1:06 PM   Additional Follow-up for Phone Call Additional follow up Details #1::        I reviewed her chart; this was 1st assessed in 11/2007. The safest treatment would be to increase the Clonazepam to 0.5 mg 1-2 at bedtime as needed to avoid potential medication interactions Additional Follow-up by: Marga Melnick MD,  May 29, 2009 5:20 AM    Additional Follow-up for Phone Call Additional follow up Details #2::    left message to call office............Marland KitchenFelecia Deloach CMA  May 29, 2009 8:36 AM  left message to call office..................Marland KitchenFelecia Deloach CMA  May 29, 2009 12:31 PM  left message to call  office...................Marland KitchenFelecia Deloach CMA  May 29, 2009 4:11 PM   pt aware,however pt states that she no longer had med. new rx sent to pharmacy.......................Marland KitchenFelecia Deloach CMA  May 30, 2009 11:57 AM   New/Updated Medications: CLONAZEPAM 0.5 MG TABS (CLONAZEPAM) 1-2  at bedtime as needed Prescriptions: CLONAZEPAM 0.5 MG TABS (CLONAZEPAM) 1-2 at bedtime as needed  #30 x 0   Entered by:   Jeremy Johann CMA   Authorized by:   Marga Melnick MD   Signed by:   Jeremy Johann CMA on 05/30/2009   Method used:   Printed then faxed to ...       [facility] (retail)       [address]       [place], [place]  [address]       Ph: [phone]       Fax: [phone]   RxID:   [id number]

## 2010-05-07 NOTE — Progress Notes (Signed)
Summary: med questions  Phone Note Call from Patient Call back at Home Phone [phone]   Caller: Spouse Summary of Call: Patient spouse called stating that the patient was given Abilify and he would like to make sure that she is to continue the Celexa and the Alprazolam. He wants to make sure that these meds are not doing the samething and will not cause a conflict. Please advise. Initial call taken by: Lucious Groves CMA,  January 18, 2010 1:12 PM  Follow-up for Phone Call        Abilify is use as an "add on",She is to continue with her other meds  Follow-up by: Jose E. Paz MD,  January 18, 2010 3:10 PM  Additional Follow-up for Phone Call Additional follow up Details #1::        Left message for pt to call back. Army Fossa CMA  January 18, 2010 3:28 PM     Additional Follow-up for Phone Call Additional follow up Details #2::    Lmtcb. Army Fossa CMA  January 21, 2010 11:28 AM   Additional Follow-up for Phone Call Additional follow up Details #3:: Details for Additional Follow-up Action Taken: Pts spouse is aware. Army Fossa CMA  January 21, 2010 11:32 AM

## 2010-05-07 NOTE — Consult Note (Signed)
Summary: [facility] == planning Cscope  [facility]   Imported By: Lanelle Bal 03/01/2010 09:55:51  _____________________________________________________________________  External Attachment:    Type:   Image     Comment:   External Document

## 2010-05-09 NOTE — Assessment & Plan Note (Signed)
Summary: DIZZINESS/PH   Vital Signs:  Patient profile:   55 year old female Height:      58 inches (147.32 cm) Weight:      140.50 pounds (63.86 kg) BMI:     29.47 Temp:     98.8 degrees F (37.11 degrees C) oral Pulse rate:   66 / minute BP sitting:   90 / 60  (left arm) Cuff size:   regular  Vitals Entered By: Lucious Groves CMA (April 29, 2010 9:56 AM) CC: C/O episodes of dizziness./kb Is Patient Diabetic? No Pain Assessment Patient in pain? no      Comments Patient nots that this began Wed night and has occurred several times since. Patient notes that it lasts about 40 minutes each time and she fainted at work. Patient also notes HA and SOB. She denies fever and rapid heartrate. Patient is no longer taking Sertraline, but could not confirm name/dosage of med the replaced it.   History of Present Illness: CC dizzines  symptoms started the day after her colonoscopy 5 days ago, she was working, she had a sudden onset of severe dizziness and she apparently passed out. She does not recall for how long she was "out", her coworkers did not notice any seizure activity, there was no bladder or bowel incontinence, she had no nausea vomiting ; some headache. She also felt slightly short of breath. she was taken to the ER via EMS Since that episode, she had a total of 5 additional episodes of dizziness that lasted about 40 minutes. Moving her head or standing up but no trigger dizziness.  She saw psychiatry about 3 weeks ago and she was slowly switched from sertraline to Viibryd, currently on Viibryd 40 mg only. She is also taking vitamin D 50,000 units weekly as prescribed by her gynecologist.  ER RECORDS from  04-25-09 EKG-- Normal sinus rhythm, Nonspecific T wave abnormality, no changes compared to old  BMP, LFTs, CBC normal Troponin negative  ROS  no fevers or fatigue good by mouth intake  Not sleeping well Denies chest or palpitations As far as her depression, she was  switched from sertraline to viibryd, her stress level is high at work, removed in general has not changed     Current Medications (verified): 1)  Levothyroxine Sodium 50 Mcg Tabs (Levothyroxine Sodium) .... Take 1 Tab Once Daily. Due For A Office Visit. 2)  Alprazolam 0.5 Mg Tabs (Alprazolam) .... One or 2 By Mouth At Bedtime As Needed For Insomnia 3)  Elestrin 0.52 Mg/0.87 Gm (0.06%) Gel (Estradiol)  Allergies (verified): 1)  ! Sulfa 2)  ! Ceftriaxone Sodium (Ceftriaxone Sodium)  Past History:  Past Medical History: Reviewed history from 09/27/2008 and no changes required. Depression Hypothyroidism Allergic rhinitis CTS  Past Surgical History: Reviewed history from 09/05/2009 and no changes required. Cholecystectomy Tonsillectomy uterine polyps aprox 2003 , Dr Lily Peer   Social History: Reviewed history from 09/05/2009 and no changes required. original from [place] no children  married at age 61 no tobacco no ETOH  Physical Exam  General:  alert, well-developed, and well-nourished.  no apparent distress Eyes:  not pale  Lungs:  Normal respiratory effort, chest expands symmetrically. Lungs are clear to auscultation, no crackles or wheezes. Heart:  normal rate, regular rhythm, and no murmur.   Extremities:  hands and wrists inspection and palpation normal except for mild enlargement of the DIP joints Neurologic:  alert & oriented X3, cranial nerves II-XII intact, strength normal in all extremities, gait normal,  and DTRs symmetrical and normal.   speech is clear and fluent Memory intact No nystagmus Romberg absent Psych:  Oriented X3, normally interactive, good eye contact, and not anxious appearing.  seems moderately anxious, no depressed   Impression & Recommendations:  Problem # 1:  DIZZINESS (ICD-780.4) severe dizziness in the setting of emotional distress, changing from sertraline to Viibryd  (9% incidental dizziness per up-to-date) and a recent  colonoscopy. Labs at the ER normal Neurological exam within normal, per history, symptoms are not likely peripheral. Symptoms   likely related to the new medication Viibryd Plan: CT of the head for completeness Will recommend the patient to drop those to 20 mg and will contact Dr. Evelene Croon  Rest, fluids, ER if symptoms severe. Patient will call if not improving  Orders: Radiology Referral (Radiology)  Problem # 2:  addendum CT of the head negative I spoke with Dr. Evelene Croon, she agreed to decrease Viibryd e and go back to sertraline if I think symptoms are related to the new medicine. I called the patient, plan is as follows: Viibryd 20 mg one a day for one week Restart sertraline 100 mg half tablet for one week After that, discontinue Viibryd increase sertraline to one tablet Followup here in 3 weeks See Dr. Evelene Croon a few weeks. Jose E. Paz MD  April 29, 2010 4:53 PM   Problem # 3:  HYPOTHYROIDISM (ICD-244.9) needs a refill Her updated medication list for this problem includes:    Levothyroxine Sodium 50 Mcg Tabs (Levothyroxine sodium) .Marland Kitchen... Take 1 tab once daily  Problem # 4:  DEPRESSION (ICD-311) see #1 and #2 The following medications were removed from the medication list:    Sertraline Hcl 100 Mg Tabs (Sertraline hcl) .Marland Kitchen... As  directed Her updated medication list for this problem includes:    Alprazolam 0.5 Mg Tabs (Alprazolam) ..... One or 2 by mouth at bedtime as needed for insomnia    Sertraline Hcl 100 Mg Tabs (Sertraline hcl) .Marland Kitchen... 1/2 tab for one week, then one tablet daily  Complete Medication List: 1)  Levothyroxine Sodium 50 Mcg Tabs (Levothyroxine sodium) .... Take 1 tab once daily 2)  Alprazolam 0.5 Mg Tabs (Alprazolam) .... One or 2 by mouth at bedtime as needed for insomnia 3)  Elestrin 0.52 Mg/0.87 Gm (0.06%) Gel (Estradiol) 4)  Sertraline Hcl 100 Mg Tabs (Sertraline hcl) .... 1/2 tab for one week, then one tablet daily  Patient Instructions: 1)  Please schedule  a follow-up appointment in 3  weeks.  Prescriptions: LEVOTHYROXINE SODIUM 50 MCG TABS (LEVOTHYROXINE SODIUM) Take 1 tab once daily  #90 x 3   Entered and Authorized by:   Nolon Rod. Paz MD   Signed by:   Nolon Rod. Paz MD on 04/29/2010   Method used:   Electronically to        [facility] [address] (retail)       [phone]       [place], [place]  [address]       Ph: [phone]       Fax: [phone]   RxID:   [id number] SERTRALINE HCL 100 MG TABS (SERTRALINE HCL) 1/2 tab for one week, then one tablet daily  #30 x 1   Entered and Authorized by:   Elita Quick E. Paz MD   Signed by:   Nolon Rod. Paz MD on 04/29/2010   Method used:   Electronically to  [facility] [address] (retail)       [phone]       [place], [place]  [address]       Ph: [phone]       Fax: [phone]   RxID:   [id number]    Orders Added: 1)  Radiology Referral [Radiology] 2)  Est. Patient Level IV [244[address]]

## 2010-05-09 NOTE — Letter (Signed)
Summary: Out of Work  [facility] [employer]  [address] [place], [place] [address]   Phone: [phone]  Fax: [phone]       April 29, 2010   Employee:  [employer]    To Whom It May Concern:   For Medical reasons, please excuse the above named employee from work for the following dates:  Start:   04/29/10    End:   05/01/10  The patient may return to work on 05/01/10. If you need additional information, please feel free to contact our office.         Sincerely,    Willow Ora, MD

## 2010-05-09 NOTE — Progress Notes (Signed)
Summary: Triage: Call request  Phone Note Call from Patient Call back at Home Phone [phone]   Summary of Call: Triage call:  Patient left message on triage that she continues to have dizziness and would like to speak with the doctor.  Initial call taken by: Lucious Groves CMA,  April 30, 2010 4:31 PM  Follow-up for Phone Call        [facility] Paz MD  May 01, 2010 9:55 AM   Additional Follow-up for Phone Call Additional follow up Details #1::        spoke with patient, she had a persistent, mild headache yesterday. Headache is gone today. still have some "heaviness" in the head. Her dizziness has definitely improved. Plan: take viibryd  for 2 additional days then discontinue it. sertraline as prescribed She will call if she has more problems Additional Follow-up by: Jose E. Paz MD,  May 01, 2010 11:23 AM

## 2010-05-15 NOTE — Procedures (Signed)
Summary: Colonoscopy---NORMAL   Colonoscopy/[facility]   Imported By: Lanelle Bal 05/06/2010 13:48:05  _____________________________________________________________________  External Attachment:    Type:   Image     Comment:   External Document

## 2010-05-22 ENCOUNTER — Ambulatory Visit (INDEPENDENT_AMBULATORY_CARE_PROVIDER_SITE_OTHER): Payer: BC Managed Care – PPO | Admitting: Internal Medicine

## 2010-05-22 ENCOUNTER — Encounter: Payer: Self-pay | Admitting: Internal Medicine

## 2010-05-22 DIAGNOSIS — J069 Acute upper respiratory infection, unspecified: Secondary | ICD-10-CM

## 2010-05-22 DIAGNOSIS — F329 Major depressive disorder, single episode, unspecified: Secondary | ICD-10-CM

## 2010-05-22 DIAGNOSIS — J309 Allergic rhinitis, unspecified: Secondary | ICD-10-CM

## 2010-05-22 DIAGNOSIS — R42 Dizziness and giddiness: Secondary | ICD-10-CM

## 2010-05-22 DIAGNOSIS — F3289 Other specified depressive episodes: Secondary | ICD-10-CM

## 2010-05-29 NOTE — Assessment & Plan Note (Signed)
Summary: 3 MONTH ROV/CBS   Vital Signs:  Patient profile:   55 year old female Weight:      138.38 pounds Temp:     98.7 degrees F oral Pulse rate:   65 / minute Pulse rhythm:   regular BP sitting:   110 / 80  (left arm) Cuff size:   regular  Vitals Entered By: Army Fossa CMA (May 22, 2010 8:19 AM) CC: Follow up visit Comments f/u on thyroid and past fainting spell c/o head congestion and earache Took OTC meds [facility]    History of Present Illness: followup from previous visit Dizziness--resolved   Anxiety depression, she discontinued viibrid, currently on sertraline, doing well. Is to see her psychiatrist next week Developed nasal congestion, ear congestion and generalized fatigue 5 days ago. 2 days ago it started to take a decongestant over-the-counter and she is starting to feel better. Also complains of chronic nasal discharge, postnasal dripping, mild, discharge is brownish.  ROS Not suicidal ideas , sleeping relatively well Denies fever dry cough for the last few days, no wheezing chest congestion Denies itchy eyes, itchy nose or sneezing  Current Medications (verified): 1)  Levothyroxine Sodium 50 Mcg Tabs (Levothyroxine Sodium) .... Take 1 Tab Once Daily 2)  Alprazolam 0.5 Mg Tabs (Alprazolam) .... One or 2 By Mouth At Bedtime As Needed For Insomnia 3)  Elestrin 0.52 Mg/0.87 Gm (0.06%) Gel (Estradiol) 4)  Sertraline Hcl 100 Mg Tabs (Sertraline Hcl) .... 1/2 Tab For One Week, Then One Tablet Daily  Allergies (verified): 1)  ! Sulfa 2)  ! Ceftriaxone Sodium (Ceftriaxone Sodium)  Past History:  Past Medical History: Reviewed history from 09/27/2008 and no changes required. Depression Hypothyroidism Allergic rhinitis CTS  Past Surgical History: Reviewed history from 09/05/2009 and no changes required. Cholecystectomy Tonsillectomy uterine polyps aprox 2003 , Dr Lily Peer   Physical Exam  General:  alert, well-developed, and  well-nourished.   Head:  face symmetric, slightly tender at the maxillary sinuses Ears:  R ear normal and L ear normal.   Nose:  congested Mouth:  no redness or discharge Lungs:  normal respiratory effort, no intercostal retractions, no accessory muscle use, and normal breath sounds.   Heart:  normal rate, regular rhythm, and no murmur.     Impression & Recommendations:  Problem # 1:  DIZZINESS (ICD-780.4) resolved , likely due to viibrid  Problem # 2:  DEPRESSION (ICD-311) d/c viibrid d/t dizziness, currently on sertraline. Feeling better. We'll see psychiatry a few days Her updated medication list for this problem includes:    Alprazolam 0.5 Mg Tabs (Alprazolam) ..... One or 2 by mouth at bedtime as needed for insomnia    Sertraline Hcl 100 Mg Tabs (Sertraline hcl) .Marland Kitchen... 1/2 tab for one week, then one tablet daily  Problem # 3:  URI (ICD-465.9) Assessment: New  Seen instructions  Problem # 4:  ALLERGIC RHINITIS (ICD-477.9) Assessment: Deteriorated symptoms consistent with allergic rhinitis start Nasonex  Her updated medication list for this problem includes:    Nasonex 50 Mcg/act Susp (Mometasone furoate) .Marland Kitchen... 2 sprays on each side of the nose daily  Complete Medication List: 1)  Levothyroxine Sodium 50 Mcg Tabs (Levothyroxine sodium) .... Take 1 tab once daily 2)  Alprazolam 0.5 Mg Tabs (Alprazolam) .... One or 2 by mouth at bedtime as needed for insomnia 3)  Elestrin 0.52 Mg/0.87 Gm (0.06%) Gel (Estradiol) 4)  Sertraline Hcl 100 Mg Tabs (Sertraline hcl) .... 1/2 tab for one week, then one tablet daily  5)  Zithromax Z-pak 250 Mg Tabs (Azithromycin) .... As directed 6)  Nasonex 50 Mcg/act Susp (Mometasone furoate) .... 2 sprays on each side of the nose daily  Patient Instructions: 1)  rest, fluids, Tylenol 2)  Over-the-counter decongestants and Mucinex DM as needed for congestion and cough 3)  If not better in 5 or 8 days, start Zithromax, an antibiotic 4)  For the  chronic nasal congestion use nasonex  5)  call if not better in a few days 6)  Please schedule a follow-up appointment in 6 months .  Prescriptions: NASONEX 50 MCG/ACT SUSP (MOMETASONE FUROATE) 2 sprays on each side of the nose daily  #1 x 12   Entered and Authorized by:   Jose E. Paz MD   Signed by:   Nolon Rod. Paz MD on 05/22/2010   Method used:   Print then Give to Patient   RxID:   [id number] ZITHROMAX Z-PAK 250 MG TABS (AZITHROMYCIN) as directed  #1 x 0   Entered and Authorized by:   Nolon Rod. Paz MD   Signed by:   Nolon Rod. Paz MD on 05/22/2010   Method used:   Print then Give to Patient   RxID:   [id number]    Orders Added: 1)  Est. Patient Level IV [27253]

## 2010-06-26 ENCOUNTER — Other Ambulatory Visit: Payer: Self-pay | Admitting: Neurosurgery

## 2010-06-26 DIAGNOSIS — M542 Cervicalgia: Secondary | ICD-10-CM

## 2010-06-27 ENCOUNTER — Other Ambulatory Visit (INDEPENDENT_AMBULATORY_CARE_PROVIDER_SITE_OTHER): Payer: BC Managed Care – PPO

## 2010-06-27 DIAGNOSIS — E559 Vitamin D deficiency, unspecified: Secondary | ICD-10-CM

## 2010-07-05 ENCOUNTER — Ambulatory Visit
Admission: RE | Admit: 2010-07-05 | Discharge: 2010-07-05 | Disposition: A | Discharge status: Home / Self Care | Payer: BC Managed Care – PPO | Source: Ambulatory Visit | Attending: Neurosurgery | Admitting: Neurosurgery

## 2010-07-05 DIAGNOSIS — M542 Cervicalgia: Secondary | ICD-10-CM

## 2010-07-11 LAB — COMPREHENSIVE METABOLIC PANEL
Albumin: 3.9 g/dL (ref 3.5–5.2)
Alkaline Phosphatase: 57 U/L (ref 39–117)
BUN: 7 mg/dL (ref 6–23)
Calcium: 9.2 mg/dL (ref 8.4–10.5)
Creatinine, Ser: 0.57 mg/dL (ref 0.4–1.2)
Potassium: 4.1 mEq/L (ref 3.5–5.1)
Total Protein: 6.7 g/dL (ref 6.0–8.3)

## 2010-07-11 LAB — CBC
HCT: 41.2 % (ref 36.0–46.0)
MCHC: 33.7 g/dL (ref 30.0–36.0)
Platelets: 202 10*3/uL (ref 150–400)
RDW: 14 % (ref 11.5–15.5)

## 2010-07-11 LAB — DIFFERENTIAL
Eosinophils Absolute: 0.1 10*3/uL (ref 0.0–0.7)
Eosinophils Relative: 2 % (ref 0–5)
Lymphocytes Relative: 45 % (ref 12–46)
Lymphs Abs: 2.6 10*3/uL (ref 0.7–4.0)
Monocytes Relative: 6 % (ref 3–12)

## 2010-08-05 ENCOUNTER — Other Ambulatory Visit: Payer: Self-pay | Admitting: Neurosurgery

## 2010-08-05 DIAGNOSIS — M542 Cervicalgia: Secondary | ICD-10-CM

## 2010-08-09 ENCOUNTER — Ambulatory Visit
Admission: RE | Admit: 2010-08-09 | Discharge: 2010-08-09 | Disposition: A | Discharge status: Home / Self Care | Payer: BC Managed Care – PPO | Source: Ambulatory Visit | Attending: Neurosurgery | Admitting: Neurosurgery

## 2010-08-09 DIAGNOSIS — M542 Cervicalgia: Secondary | ICD-10-CM

## 2010-08-20 ENCOUNTER — Other Ambulatory Visit: Payer: Self-pay | Admitting: Neurosurgery

## 2010-08-20 DIAGNOSIS — M542 Cervicalgia: Secondary | ICD-10-CM

## 2010-08-20 NOTE — Assessment & Plan Note (Signed)
[facility]                                 ON-CALL NOTE   [name], [name]                          MRN:          [id number]  DATE:05/08/2007                            DOB:          March 20, 1956    CALLER:  [name] about [name]   DATE:  May 08, 2007   DATE OF BIRTH:  1955/06/24   PHONE NUMBER:  [phone]   TIME OF CALL:  Call came in on May 08, 2007 at 8:47 a.m.   CHIEF COMPLAINT:  Not getting better.  The caller said he took [place] to  [facility] this week.  She was diagnosed with a viral upper  respiratory tract infection, ear ache, congestion, sore throat.  They  gave her Hydromet syrup and told her to take Mucinex.  She has gotten  sick from the Hydromet syrup.  It made her throw up and has made her a  little disoriented and feel bad, and I told them to come to the [facility]  office for further evaluation this morning and not to give her any more  Hydromet syrup.     [name], MD  Electronically Signed    MAT/MedQ  DD: 05/08/2007  DT: 05/08/2007  Job #: [id number]

## 2010-08-22 ENCOUNTER — Ambulatory Visit (INDEPENDENT_AMBULATORY_CARE_PROVIDER_SITE_OTHER): Payer: BC Managed Care – PPO | Admitting: Internal Medicine

## 2010-08-22 ENCOUNTER — Encounter: Payer: Self-pay | Admitting: Internal Medicine

## 2010-08-22 ENCOUNTER — Ambulatory Visit
Admission: RE | Admit: 2010-08-22 | Discharge: 2010-08-22 | Disposition: A | Discharge status: Home / Self Care | Payer: BC Managed Care – PPO | Source: Ambulatory Visit | Attending: Neurosurgery | Admitting: Neurosurgery

## 2010-08-22 DIAGNOSIS — M5412 Radiculopathy, cervical region: Secondary | ICD-10-CM | POA: Insufficient documentation

## 2010-08-22 DIAGNOSIS — M542 Cervicalgia: Secondary | ICD-10-CM

## 2010-08-22 DIAGNOSIS — F329 Major depressive disorder, single episode, unspecified: Secondary | ICD-10-CM

## 2010-08-22 DIAGNOSIS — E039 Hypothyroidism, unspecified: Secondary | ICD-10-CM

## 2010-08-22 NOTE — Assessment & Plan Note (Signed)
Due for labs

## 2010-08-22 NOTE — Assessment & Plan Note (Addendum)
To have   Epidural #3 soon

## 2010-08-22 NOTE — Assessment & Plan Note (Addendum)
See history of present illness, symptoms relatively well controlled.  chart reviewed In the past  took Lexapro which worked but it was discontinued due to cost Apparently tolerated well citalopram as well. Was eventually switched to Zoloft. We could consider retry citalopram in the future if needed Intolerant to Abilify. Viibrid -------> cuased severe dizzines Saw Dr Evelene Croon x 1 ~ 05-2010, will not longer see  I discussed with the patient the option of changing medication but at this point she is doing relatively well and likes to stay on Xanax and sertraline. We'll reassess in 3 months.

## 2010-08-22 NOTE — Progress Notes (Signed)
  Subjective:    Patient ID: Jamie Patrick, female    DOB: February 26, 1956, 55 y.o.   MRN: [id number]  HPI Followup on depression and anxiety  Past Medical History  Diagnosis Date  . Depression   . Hypothyroidism   . Allergic rhinitis   . CTS (carpal tunnel syndrome)    Past Surgical History  Procedure Date  . Cholecystectomy   . Tonsillectomy   . Uterine polyps 2003    Dr.Fernadez     Review of Systems Patient saw Dr. Evelene Croon few months ago, does not like to return to them. Currently, complaining of increased appetite at night, was told by Dr. Evelene Croon that is due to Xanax, was Rx  another medication instead but she couldn't tolerate. Currently, symptoms are relatively well controlled. Still has occasional difficulty with sleeping, her anxiety/depression are not 100% control however she is changing jobs and hopes that her stress level will decrease. Also continue with neck pain, under the care of Dr. Jeral Fruit.     Objective:   Physical Exam Alert, oriented, in no apparent distress.       Assessment & Plan:  Today , I spent more than 15  min with the patient, >50% of the time counseling

## 2010-08-23 ENCOUNTER — Telehealth: Payer: Self-pay | Admitting: *Deleted

## 2010-08-23 NOTE — Assessment & Plan Note (Signed)
[facility]                        [facility] OFFICE NOTE   STACIE, TEMPLIN                  MRN:          [id number]  DATE:04/27/2006                            DOB:          1956/02/21    REASON FOR VISIT:  Followup anxiety and depression.   Ms. Jamie Patrick is a 55 year old female whom I saw last month with  depression.  I started her on Lexapro 10 mg daily.  The patient reports  that she has noticed a significant improvement, described it as a  difference between night and day.  She is very grateful for the  medications.  States this changed her life significantly.  She is  additionally starting to exercise regularly and joined a gym.  She was  wondering if she could take Xanax 0.25 mg as needed for sleep.  She has  tried Ambien as well as Lunesta with significant adverse reactions, and  prefers not to try these types of medicines again.   She was also seen yesterday at a walk-in clinic for a cough with  congestion.  She was diagnosed with upper respiratory infection and was  given decongestants and cough suppressants.  She was advised to start a  Z-Pak if her symptoms do not improve in the next 3 to 4 days.  The  patient states that her symptoms started 5 days ago.  Denies any fevers  or chills.  She does have a prescription for Z-Pak in her pocket.   MEDICATIONS:  Please see med list.   ALLERGIES:  SULFA, CEPHALOSPORIN.   REVIEW OF SYSTEMS:  As per HPI.  Otherwise, unremarkable.   OBJECTIVE:  Weight 151, temperature 98.5, pulse 76, blood pressure  120/78.  GENERAL:  We have a pleasant female who appears nasally congested, but  in no acute distress.  Answers questions appropriately.  Mood appears  improved compared to the last visit.  HEENT:  Nasal mucosa was boggy with yellow nasal discharge.  Oropharynx  was slightly erythematous with post-nasal drip.  LUNGS:  Clear.  HEART:  Regular rate and rhythm.  No murmurs,  gallops, or rubs.  PSYCH:  Speech regular rate and rhythm.  Mood improved.  Affect normal.   IMPRESSION:  1. Major depressive episode, improved on Lexapro 10 mg.  She has also      seen [facility] for therapy.  2. Upper respiratory infection.   PLAN:  1. Advised the patient to continue Lexapro 10 mg, as well as seeing      [facility] as scheduled.  She is to follow up with me in 2 months,      sooner if need be.  2. In regards to her URI symptoms, agreed with the assessment by      Urgent Care.  Advised her      if the symptoms were not improving, she is to go ahead and start      the Z-Pak.  She can call      our office on Wednesday if her symptoms have not improved      significantly, or if she needs an extension of her work excuse.  The patient expressed understanding.     Leanne Chang, M.D.  Electronically Signed    LA/MedQ  DD: 04/27/2006  DT: 04/27/2006  Job #: [id number]

## 2010-08-23 NOTE — H&P (Signed)
Jamie Patrick, Jamie Patrick                     ACCOUNT NO.:  [id number]   MEDICAL RECORD NO.:  [id number]                   PATIENT TYPE:  AMB   LOCATION:  [facility]                                 FACILITY:  [facility]   PHYSICIAN:  Juan H. Lily Peer, M.D.             DATE OF BIRTH:  04-20-1955   DATE OF ADMISSION:  08/16/2003  DATE OF DISCHARGE:                                HISTORY & PHYSICAL   CHIEF COMPLAINT:  Dysfunctional uterine bleeding.   HISTORY OF PRESENT ILLNESS:  The patient is a 55 year old, gravida 0, who  has been evaluated for dysfunctional uterine bleeding.  She had been on  Ortho Tri-Cyclen Low for ovarian suppression.  She had been doing well for  several months and has had breakthrough bleeding.  Based on her age of being  50, an endometrial biopsy was done, which demonstrated endometrial polyp  with an adenomatous-type weakly secretory endometrium, exogenous  progestational effect, and focal glandular stromal breakdown.  There was no  evidence of features of hyperplasia or malignancy.  She underwent a  sonohysterogram which demonstrated uterus with a focal echogenic area  confirmed on sonohysterogram and an echogenic floating defect was noted to  extend from the right wall of the cavity measuring 11 x 3 mm, probably a  polypoid-like lesion.  She had a small intramural myoma measuring 12 x 9 mm.  The right and left ovaries were normal.  Based on these findings, it was  decided to proceed with a resectoscopic polypectomy.  The patient was seen  in the office for preoperative consultation on Aug 15, 2003.  A laminaria  was placed intracervically to facilitate insertion of the operative  resectoscope on the day of her surgery.   ALLERGIES:  The patient is allergic to SULFA and CEPHALOSPORIN.   SOCIAL HISTORY:  She denies smoking or alcohol consumption.   MEDICATIONS:  She had been on Ortho Tri-Cyclen Low.  For hypothyroidism, she  has been on Synthroid.   PHYSICAL  EXAMINATION:  GENERAL APPEARANCE:  A well-developed, well-nourished  female.  WEIGHT:  138 pounds.  HEIGHT:  4 feet 11 inches tall.  HEENT:  Unremarkable.  NECK:  Supple.  Trachea midline.  No carotid bruits.  No thyromegaly.  LUNGS:  Clear to auscultation without any rhonchi or wheezes.  HEART:  With regular rate and rhythm.  No murmurs or gallops.  BREASTS:  Exam was done in 2004, which was normal.  ABDOMEN:  Soft and nontender without rebound or guarding.  PELVIC:  Bartholin's, urethral, and Skene's glands within normal limits.  Vaginal and cervix with no lesions or discharge.  Uterus anteverted and  normal in size, shape, and consistency.  Adnexa without any masses or  tenderness.  RECTAL:  Exam deferred.   ASSESSMENT:  A 55 year old nulliparous patient with dysfunctional uterine  bleeding.  Evaluation had demonstrated on a sonohysterogram that the patient  had what appeared to be an endometrial polyp.  She underwent  an endometrial  biopsy on August 02, 2003, which demonstrated an endometrial polyp with  adenomatous-type weakly secretory endometrium.  Based on these findings, the  patient will undergo resectoscopic polypectomy on Aug 16, 2003.  The patient  had a laminaria placed intracervically on Aug 15, 2003.  She underwent  preoperative consultation.  The risks, benefits, pros, and cons of the  procedure were discussed.  She had just finished her period the day before.  All of the above were discussed with the patient.  All questions were  answered in Spanish and we will following accordingly.   PLAN:  The patient is scheduled for resectoscopic polypectomy on Wednesday,  Aug 16, 2003, at the [facility].                                               Juan H. Lily Peer, M.D.    JHF/MEDQ  D:  08/15/2003  T:  08/15/2003  Job:  [id number]

## 2010-08-23 NOTE — Assessment & Plan Note (Signed)
[facility]                        [facility] OFFICE NOTE   AKIVA, JOSEY                  MRN:          [id number]  DATE:03/27/2006                            DOB:          22-Oct-1955    REFERRING PHYSICIAN:  Lenard Lance   REASON FOR VISIT:  Establish care.   HISTORY OF PRESENT ILLNESS:  Jamie Patrick is a 55 year old  [place] female who presents today to establish care. Additionally, she  reports a 1 year history of fatigue. On further questioning, the patient  did reveal a significant amount of stressors in her life including work  and family. She reports increased irritability, decreased drive,  insomnia, and crying. She reports that it is a struggle to wake up in  the morning and start her day. Sometimes, it is due to generalized aches  but most of the time, it is due to the lack of drive. The patient does  hold a full time job as well as attending school in the evening. She is  married with no children.   Recently, she was seen by Dr. Lily Peer for her annual examination. She  is routinely monitored closely for her hypothyroidism by Dr. Lily Peer  and the most recent blood work was within normal limits. Additionally,  she was started on Alprazolam 0.25, which she uses in the evening to  sleep.   PAST MEDICAL HISTORY:  1. Hypothyroidism.  2. Seasonal allergic rhinitis.  3. Anxiety.  4. Colon polyps.  5. Cervical cancer.   MEDICATIONS:  Levothyroxine 25 mcg.  1. Zyrtec.  2. Zantac 0.5 mg daily.  3. Ibuprofen 800 mg p.r.n.  4. Multivitamins.  5. Flax seed oil 1,000 mg daily.  6. Vitamin D and vitamin C supplements.  7. Calcium 1,000 mg daily.  8. __________ HS 0.625 mg daily.   ALLERGIES:  SULFA, CEPHALOSPORIN.   FAMILY HISTORY:  Mother passed away from liver cirrhosis. She had a  history of osteoporosis and rheumatoid arthritis. Father passed away at  the age of 51. He had a history of liver  cirrhosis as well. She has 2  siblings, one with a CVA in the past.   SOCIAL HISTORY:  The patient is married. Born in [place]. Denies any  alcohol or tobacco use. She currently works at a bank.   REVIEW OF SYSTEMS:  As per HPI. Otherwise, unremarkable.   OBJECTIVE:  VITAL SIGNS:  Weight 150, pulse 60, blood pressure 120/80.  GENERAL:  A pleasant, anxious, female in no acute distress. She answers  questions appropriately.  HEENT:  Unremarkable.  NECK:  Supple. No lymphadenopathy, carotid bruits, or JVD. No  thyromegaly was noted.  LUNGS:  Clear with good air movement.  HEART:  Regular rate and rhythm. Normal S1 and S2. No murmur, rub, or  gallop.  ABDOMEN:  Soft and nondistended. No palpable masses. No  hepatosplenomegaly. No rebound or guarding.  EXTREMITIES:  No clubbing, cyanosis, or edema. No obvious synovitis or  nodules were noted of the joint.  NEUROLOGIC/PSYCHIATRIC:  Significant for a depressed mood. The patient  was tearful as we discussed depression. Speech was regular rate and  rhythm.  Neurologic examination was non-focal.   IMPRESSION:  A 55 year old female with a 1 year history of fatigue. She  does have other symptoms, which together are very consistent with major  depressive episode.   RECOMMENDATIONS:  1. Most of the 50 minute visit was used discussing depression and her      risk factors. Supportive counseling was provided.  2. The patient agreed to further therapy. Will refer to [facility]      for psychotherapy. Additionally, she agreed to Lexapro 10 mg daily.      Side effects were reviewed with patient.  3. The patient does have a history of rheumatoid arthritis and lupus      in the family and thus, I will do some routine blood work including      ANA, sed rate, rheumatoid factor, CBC, comprehensive metabolic      profile, and TSH.  4. The patient to followup with me in one month or sooner if need be.     Leanne Chang, M.D.  Electronically  Signed    LA/MedQ  DD: 03/27/2006  DT: 03/28/2006  Job #: [id number]

## 2010-08-23 NOTE — Op Note (Signed)
Jamie Patrick, Jamie Patrick         ACCOUNT NO.:  [id number]   MEDICAL RECORD NO.:  [id number]          PATIENT TYPE:  AMB   LOCATION:  [facility]                         FACILITY:  [facility]   PHYSICIAN:  Anselmo Rod, M.D.  DATE OF BIRTH:  23-Sep-1955   DATE OF PROCEDURE:  03/26/2005  DATE OF DISCHARGE:  03/26/2005                                 OPERATIVE REPORT   PROCEDURE:  Colonoscopy with cold biopsies x4.   ENDOSCOPIST:  Anselmo Rod, M.D.   INSTRUMENT USED:  Olympus videocolonoscope.   INDICATIONS FOR PROCEDURE:  The patient is a 55 year old Hispanic female  with history of rectal bleeding, rule out colonic polyps, masses, etc.   PREPROCEDURE PREPARATION:  Informed consent was procured from the patient.  The patient fasted for four hours prior to the procedure and prepped with  OsmoPrep pills the night of and the morning of the procedure.  The risks and  benefits of the procedure, including a 10% missed rate of cancer and polyps,  was discussed with the patient as well.   PREPROCEDURE PHYSICAL EXAMINATION:  VITAL SIGNS:  Stable.  NECK:  Supple.  CHEST:  Clear to auscultation.  S1 and S2 regular.  ABDOMEN:  Soft with normal bowel sounds.   DESCRIPTION OF PROCEDURE:  The patient was placed in the left lateral  decubitus position and sedated with 100 mcg of Fentanyl and 7.5 mg of Versed  in slow incremental doses.  Once the patient was adequately sedated and  maintained on low-flow oxygen and continuous cardiac monitoring, the Olympus  videocolonoscope was advanced from the rectum to the cecum.  The appendiceal  orifice and ileocecal valve were clearly visualized and photographed.  The  terminal ileum appeared healthy and without lesions.  A small sessile flat  polyp was biopsied from the cecal base (cold biopsies x2).  A small sessile  polyp was biopsied from the hepatic flexure as well (cold biopsies x2).  There was no evidence of diverticulosis.  Retroflexion in the rectum  revealed no abnormalities.  There was no evidence of hemorrhoids.  No  definite source of bleeding could be identified.  The patient tolerated the  procedure well without immediate complications.   IMPRESSION:  1.  One small sessile polyp biopsied from the hepatic flexure and another      one from the cecum.  2.  No evidence of diverticulosis.   RECOMMENDATIONS:  1.  Await pathology results.  2.  Continue a high fiber diet with liberal fluid intake.  3.  Use stool softeners as needed.  4.  Avoid all nonsteroidals for now.  5.  Outpatient followup as need arises in the future.  6.  Repeat colonoscopy depending on pathology results.      Anselmo Rod, M.D.  Electronically Signed     JNM/MEDQ  D:  03/26/2005  T:  03/28/2005  Job:  [id number]   cc:   Gaetano Hawthorne. Lily Peer, M.D.  Fax: [phone]

## 2010-08-23 NOTE — Telephone Encounter (Signed)
Error

## 2010-08-23 NOTE — Op Note (Signed)
[name], [name]                     ACCOUNT NO.:  [id number]   MEDICAL RECORD NO.:  [id number]                   PATIENT TYPE:  AMB   LOCATION:  [facility]                                 [facility]ACILITY:  [facility]   PHYSICIAN:  Juan H. Lily Peer, M.D.             DATE OF BIRTH:  [birth year]   DATE OF PROCEDURE:  08/16/2003  DATE OF DISCHARGE:                                 OPERATIVE REPORT   INDICATIONS FOR OPERATION:  A 55 year old gravida 0 who is evaluated for  dysfunctional uterine bleeding, was found to have what appeared to be a  polypoid-like lesion in the endometrial cavity.  The endometrial biopsy had  demonstrated an endometrial polyp with an adenomatous-type weakly  proliferative secretory endometrium with exogenous progestational effect  with focal glandular, stromal breakdown.   PREOPERATIVE DIAGNOSES:  1. Dysfunctional uterine bleeding.  2. Endometrial polyp.   POSTOPERATIVE DIAGNOSES:  1. Dysfunctional uterine bleeding.  2. Endometrial polyp.   PROCEDURE:  Resectoscopic polypectomy.   SURGEON:  Juan H. Lily Peer, M.D.   ANESTHESIA:  General endotracheal anesthesia.   FINDINGS:  A small endometrial polyp coming off the right cornual  region/entrance to the fallopian tube, contralateral cornual region was  normal.  The rest of the endometrial cavity and the cervical canal were  clear.  No evidence of any lesions.   DESCRIPTION OF OPERATION:  After the patient was adequately counseled, she  was taken to the operating room where she underwent a successful general  endotracheal anesthesia.  She received a gram of Cefotan for prophylaxis and  had PSA stockings to prevent DVT.  She was placed in the low lithotomy  position and the vaginal and perineum were prepped and draped in the usual  sterile fashion. She had evacuated her bladder prior to being taken to the  operating room.   Examination under anesthesia demonstrates a slightly anteverted uterus with  no  palpable adnexal masses. A weighted speculum and a __________ retractor  were introduced intravaginally for exposure.  The anterior cervical lip was  grasped with a single-tooth tenaculum and a sponge that was previously  placed the day before to keep the luminary in were removed as well as the  luminary thus requiring minimal cervical dilatation which facilitated the  insertion of the operative resectoscope with the 90-degree wire loop which  was introduced into the anterior uterine cavity.  The distending media was  Sorbitol 3%.  Inspection of the endocervical canal was normal and smooth.  Endometrial cavity--no lesions with the exception of the polypoid fleshy  lesion coming off the right cornual region.  The [employer]yl[employer] Electrosurgical  Generator was set at 70 watts in the cutting mode and setting on the  coagulator mode; and the polyp in the region there was resected and  submitted for histologic evaluation.   The patient tolerated the procedure well.  Single-tooth tenaculum was  removed.  Silver nitrate was placed on the area of the prongs which  caused a  little bit of bleeding before hemostasis.  Once this was reassured that it  was contained, the patient was extubated, transferred to the recovery room  with stable vital signs.  Blood loss was minimal and fluid resuscitation  consisted of 800 cc of lactated Ringers.  Pre-and-postoperative procedure  pictures were obtained and a copy will be kept in the patient's record and a  second copy will be kept at [facility]' office.                                               Juan H. Lily Peer, M.D.    JHF/MEDQ  D:  08/16/2003  T:  08/16/2003  Job:  [id number]

## 2010-09-23 ENCOUNTER — Telehealth: Payer: Self-pay

## 2010-09-23 DIAGNOSIS — M62838 Other muscle spasm: Secondary | ICD-10-CM

## 2010-09-23 NOTE — Telephone Encounter (Signed)
Pt is aware.  

## 2010-09-23 NOTE — Telephone Encounter (Signed)
If they are recommending to see a neurologist I would agree. If a referral needed, send her to [facility]

## 2010-09-23 NOTE — Telephone Encounter (Signed)
Pt says she was seeing Dr. Jeral Fruit for neck pain has had 3 epidural injections and still having some muscles spasms and was recommended that she a neurologist and wanted to know what Dr. Drue Novel might think.

## 2010-10-04 ENCOUNTER — Ambulatory Visit (INDEPENDENT_AMBULATORY_CARE_PROVIDER_SITE_OTHER): Payer: BC Managed Care – PPO | Admitting: Gynecology

## 2010-10-04 DIAGNOSIS — N898 Other specified noninflammatory disorders of vagina: Secondary | ICD-10-CM

## 2010-10-04 DIAGNOSIS — B373 Candidiasis of vulva and vagina: Secondary | ICD-10-CM

## 2010-10-15 ENCOUNTER — Telehealth: Payer: Self-pay | Admitting: Internal Medicine

## 2010-10-15 NOTE — Telephone Encounter (Signed)
In reference to Neurologic Referral to [facility], I have been waiting on patient to have notes from Dr. Cassandria Santee office faxed to my attention.  I placed call to patient today to check status on records, and patient states she has been trying to get records from [facility], but is being told that Dr. Jeral Fruit will not & does not want to send his notes to Dr. Drue Novel.  Patient states this is due to some sort of "incident".  Patient is at work, unable to go into much detail.  Without Botero's office note I am unable to get an appointment with Neurology for patient which she needs asap.  Also, patient is scheduled for CPX with Dr. Drue Novel on 11-21-10 at 8am.  Patient states she called our office last week asking for help, and stating she needed an appointment sooner & was told nothing sooner available.  Patient started new job, asking for CPX next week very early or very late.  Patient very concerned because she had a short seizure at her dental appt this morning, 10-15-10.  No ambulance was called, they just kept a check on her blood pressure.  Dental office is supposed to be sending report of this to Dr. Drue Novel.

## 2010-10-16 NOTE — Telephone Encounter (Signed)
1. Pt to ask Dr Jeral Fruit office for a referral since they suggested it  2. Schedule a CPX here within 1 week, overbook ok, we can do the visit and later on labs so visit does not need to be early

## 2010-10-16 NOTE — Telephone Encounter (Signed)
Message left for patient to return my call.  

## 2010-10-16 NOTE — Telephone Encounter (Signed)
Pt is aware- will schedule CPX.

## 2010-10-16 NOTE — Telephone Encounter (Signed)
Pts husband called back and stated they feel she may have had an allergic reaction to the lidocaine w/ epi, per dentist this is what they used.

## 2010-10-17 NOTE — Telephone Encounter (Signed)
Addended by: Army Fossa R on: 10/17/2010 11:43 AM   Modules accepted: Orders

## 2010-10-17 NOTE — Telephone Encounter (Signed)
Removed neuro referral per Luster Landsberg and Dr.Paz suggestion Dr.Botero referral.

## 2010-10-23 ENCOUNTER — Encounter: Payer: Self-pay | Admitting: Internal Medicine

## 2010-10-23 ENCOUNTER — Ambulatory Visit (INDEPENDENT_AMBULATORY_CARE_PROVIDER_SITE_OTHER): Payer: BC Managed Care – PPO | Admitting: Internal Medicine

## 2010-10-23 DIAGNOSIS — M542 Cervicalgia: Secondary | ICD-10-CM

## 2010-10-23 DIAGNOSIS — F329 Major depressive disorder, single episode, unspecified: Secondary | ICD-10-CM

## 2010-10-23 DIAGNOSIS — Z0000 Encounter for general adult medical examination without abnormal findings: Secondary | ICD-10-CM

## 2010-10-23 DIAGNOSIS — R259 Unspecified abnormal involuntary movements: Secondary | ICD-10-CM

## 2010-10-23 MED ORDER — CYCLOBENZAPRINE HCL 5 MG PO TABS: 5.0000 mg | ORAL_TABLET | Freq: Two times a day (BID) | ORAL | Status: DC | PRN

## 2010-10-23 NOTE — Assessment & Plan Note (Signed)
See history of present illness, needed supervision for Flexeril, also needs a NSAID (rec Motrin)

## 2010-10-23 NOTE — Progress Notes (Signed)
Subjective:    Patient ID: Jamie Patrick, female    DOB: [birth year], 55 y.o.   MRN: [id number]  HPI Complete physical exam, multiple other issues, see review of systems  Past Medical History  Diagnosis Date  . Anxiety and depression   . Hypothyroidism   . Allergic rhinitis   . CTS (carpal tunnel syndrome)     question of  . Vitamin D deficiency   . Osteopenia     as reported by pt, DEXA @ gyn  . Colon polyps     BENIGN  . Dysplasia of cervix, low grade (CIN 1)     CIN-1/CIN2   Past Surgical History  Procedure Date  . Cholecystectomy   . Tonsillectomy   . Uterine polyps 2003    Dr.Fernadez/ RESECTOSCOPIC POLYPECTOMY  . Cryotherapy     FOR CERVICAL DYSPLASIA CIN-1/CIN-2   History   Social History  . Marital Status: Married    Spouse Name: N/A    Number of Children: 0  . Years of Education: N/A   Occupational History  .     Social History Main Topics  . Smoking status: Never Smoker   . Smokeless tobacco: Not on file  . Alcohol Use: Yes     rarely   . Drug Use: No  . Sexually Active: Not on file   Other Topics Concern  . Not on file   Social History Narrative   Original from [place]--- diet:    Family History  Problem Relation Age of Onset  . Diabetes Brother     uncles, GM  . Hypertension Brother   . Heart attack Neg Hx   . Colon cancer Neg Hx   . Breast cancer      distant cousins  . Osteoporosis Sister     2 sisters      Review of Systems No nausea, vomiting, diarrhea. No chest pain or shortness of breath No headaches Neck pain is improving. She is taking a combination of Flexeril and  a anti-inflammatory (not from the [place]) and likes a prescription Depression is relatively well controlled with current medications. Recently, she has noted involuntary arms and legs movements at night. She was  at her dentist,  had uncontrollable movements for 1 minute shortly after lidocaine injection. There was no rash, shortness of breath, itching or  seizure activity. Patient concerned about this issue, request a neurology referral. Years ago, she saw Dr. Mayford Knife, biopsy of the face confirmed rosacea. Symptoms have returned lately: perinasal burning and redness. No white or black heads    Objective:   Physical Exam  Constitutional: She is oriented to person, place, and time. She appears well-developed and well-nourished. No distress.  HENT:  Head: Normocephalic and atraumatic.  Neck: No thyromegaly present.  Cardiovascular: Normal rate, regular rhythm and normal heart sounds.   No murmur heard. Pulmonary/Chest: Effort normal and breath sounds normal. No respiratory distress. She has no wheezes. She has no rales.  Abdominal: Soft. Bowel sounds are normal. She exhibits no distension. There is no tenderness. There is no rebound.  Musculoskeletal: She exhibits no edema.  Neurological: She is alert and oriented to person, place, and time.  Skin: Skin is warm and dry. She is not diaphoretic.       Very mild peri-nasal redness, no white-black heads  Psychiatric: She has a normal mood and affect. Her behavior is normal. Judgment and thought content normal.          Assessment & Plan:

## 2010-10-23 NOTE — Assessment & Plan Note (Addendum)
C/o involuntary movements of extremities, pt concerned about a seizure (no seizure activity on clinical grounds) Requires neuro referal: done

## 2010-10-23 NOTE — Patient Instructions (Signed)
For pain, flexeril and motrin, watch for somnolence and stomach irritationj

## 2010-10-23 NOTE — Assessment & Plan Note (Addendum)
Stable, no change, will call for RF PRN as she does not longer see psych

## 2010-10-24 LAB — LIPID PANEL
Cholesterol: 218 mg/dL — ABNORMAL HIGH (ref 0–200)
HDL: 48 mg/dL (ref >39.00)
Triglycerides: 93 mg/dL (ref 0.0–149.0)

## 2010-10-24 LAB — TSH: TSH: 0.61 u[IU]/mL (ref 0.35–5.50)

## 2010-10-29 NOTE — Assessment & Plan Note (Signed)
Td aprox 2006 per patient    sees Dr Lily Peer routinely , h/o CIN gets MMG yearly per pt  Cscope Dr [address], aprox 2006 and again 04-2010, next in 5 years per report   counseled ref diet - exercise  Labs

## 2010-11-07 ENCOUNTER — Other Ambulatory Visit: Payer: Self-pay | Admitting: Gynecology

## 2010-11-07 DIAGNOSIS — Z1231 Encounter for screening mammogram for malignant neoplasm of breast: Secondary | ICD-10-CM

## 2010-11-21 ENCOUNTER — Encounter: Payer: BC Managed Care – PPO | Admitting: Internal Medicine

## 2010-12-02 ENCOUNTER — Ambulatory Visit (HOSPITAL_COMMUNITY)
Admission: RE | Admit: 2010-12-02 | Discharge: 2010-12-02 | Disposition: A | Discharge status: Home / Self Care | Payer: BC Managed Care – PPO | Source: Ambulatory Visit | Attending: Gynecology | Admitting: Gynecology

## 2010-12-02 DIAGNOSIS — Z1231 Encounter for screening mammogram for malignant neoplasm of breast: Secondary | ICD-10-CM | POA: Insufficient documentation

## 2010-12-13 ENCOUNTER — Other Ambulatory Visit: Payer: Self-pay | Admitting: Internal Medicine

## 2010-12-13 NOTE — Telephone Encounter (Signed)
Xanax 90-day supply request [last refill 11/28/09 #60x1]

## 2010-12-15 NOTE — Telephone Encounter (Signed)
Ok 180 and 1 RF 

## 2010-12-18 MED ORDER — ALPRAZOLAM 0.5 MG PO TABS: 0.5000 mg | ORAL_TABLET | Freq: Every evening | ORAL | Status: DC | PRN

## 2010-12-18 NOTE — Telephone Encounter (Signed)
Rx faxed

## 2010-12-23 ENCOUNTER — Other Ambulatory Visit: Payer: Self-pay | Admitting: Internal Medicine

## 2010-12-23 NOTE — Telephone Encounter (Signed)
Called the pharmacy and alprazolam was received on 12/18/10 180 with 1 rf.

## 2011-01-06 ENCOUNTER — Other Ambulatory Visit: Payer: Self-pay | Admitting: *Deleted

## 2011-01-06 MED ORDER — MEDROXYPROGESTERONE ACETATE 10 MG PO TABS: 10.0000 mg | ORAL_TABLET | Freq: Every day | ORAL | Status: DC

## 2011-02-17 ENCOUNTER — Other Ambulatory Visit (HOSPITAL_COMMUNITY)
Admission: RE | Admit: 2011-02-17 | Discharge: 2011-02-17 | Disposition: A | Discharge status: Home / Self Care | Payer: BC Managed Care – PPO | Source: Ambulatory Visit | Attending: Gynecology | Admitting: Gynecology

## 2011-02-17 ENCOUNTER — Ambulatory Visit (INDEPENDENT_AMBULATORY_CARE_PROVIDER_SITE_OTHER): Payer: BC Managed Care – PPO | Admitting: Gynecology

## 2011-02-17 ENCOUNTER — Ambulatory Visit (INDEPENDENT_AMBULATORY_CARE_PROVIDER_SITE_OTHER): Payer: BC Managed Care – PPO

## 2011-02-17 ENCOUNTER — Encounter: Payer: Self-pay | Admitting: Gynecology

## 2011-02-17 DIAGNOSIS — N83339 Acquired atrophy of ovary and fallopian tube, unspecified side: Secondary | ICD-10-CM

## 2011-02-17 DIAGNOSIS — D259 Leiomyoma of uterus, unspecified: Secondary | ICD-10-CM

## 2011-02-17 DIAGNOSIS — R1032 Left lower quadrant pain: Secondary | ICD-10-CM

## 2011-02-17 DIAGNOSIS — D251 Intramural leiomyoma of uterus: Secondary | ICD-10-CM

## 2011-02-17 DIAGNOSIS — N949 Unspecified condition associated with female genital organs and menstrual cycle: Secondary | ICD-10-CM

## 2011-02-17 DIAGNOSIS — R102 Pelvic and perineal pain: Secondary | ICD-10-CM

## 2011-02-17 DIAGNOSIS — L659 Nonscarring hair loss, unspecified: Secondary | ICD-10-CM

## 2011-02-17 DIAGNOSIS — Z1211 Encounter for screening for malignant neoplasm of colon: Secondary | ICD-10-CM

## 2011-02-17 DIAGNOSIS — Z01419 Encounter for gynecological examination (general) (routine) without abnormal findings: Secondary | ICD-10-CM | POA: Insufficient documentation

## 2011-02-17 DIAGNOSIS — N951 Menopausal and female climacteric states: Secondary | ICD-10-CM

## 2011-02-17 LAB — TESTOSTERONE: Testosterone: <10 ng/dL — ABNORMAL LOW (ref 10–70)

## 2011-02-17 MED ORDER — ESTRADIOL 0.5 MG PO TABS: 1.0000 mg | ORAL_TABLET | Freq: Every day | ORAL | Status: DC

## 2011-02-17 MED ORDER — MEDROXYPROGESTERONE ACETATE 10 MG PO TABS: 10.0000 mg | ORAL_TABLET | Freq: Every day | ORAL | Status: DC

## 2011-02-17 NOTE — Progress Notes (Signed)
Jamie Patrick 05/14/1955 [id number]   History:    55 y.o.  for annual exam and brought to my attention that for the past 3-4 months she's continues to have left lower quadrant pain. Review of her record indicated that she had a colonoscopy in January of this year which was negative and she has had a history of polyps benign in the past. Her last bone density study was in December 2011 lowest T score was -1.6 at the left femoral neck and the Frax analysis were supple threshold. She had vitamin D deficiency and was corrected with a last vitamin D level be normal March of 2012. She has history of disc disease and has been followed by Dr. Jeral Fruit who has given her epidural injections. Her last mammogram was in August of 2012 which was normal she does her monthly self breast examination. She was complaining of some hair loss as well. Her primary physician's Dr. Drue Novel who did her CBC, lipid profile, conference metabolic panel, and TSH which all were normal in July of this year. She still having hot flashes on the elestrin transdermal that she was applying wanted to change regimen.  Past medical history,surgical history, family history  social history were all reviewed and documented in the EPIC chart.  Gynecologic History Patient's last menstrual period was 02/03/2009. Contraception: none Last Pap: 2011. Results were: normal Last mammogram: 2012. Results were: normal  Obstetric History OB History    Grav Para Term Preterm Abortions TAB SAB Ect Mult Living   0                ROS:  Was performed and pertinent positives and negatives are included in the history.  Exam: chaperone present  BP 110/80  Ht 4\' 10"  (1.473 m)  Wt 141 lb (63.957 kg)  BMI 29.47 kg/m2  LMP 02/03/2009  Body mass index is 29.47 kg/(m^2).  General appearance : Well developed well nourished female. No acute distress HEENT: Neck supple, trachea midline, no carotid bruits, no thyroidmegaly Lungs: Clear to auscultation, no  rhonchi or wheezes, or rib retractions  Heart: Regular rate and rhythm, no murmurs or gallops Breast:Examined in sitting and supine position were symmetrical in appearance, no palpable masses or tenderness,  no skin retraction, no nipple inversion, no nipple discharge, no skin discoloration, no axillary or supraclavicular lymphadenopathy Abdomen: no palpable masses or tenderness, no rebound or guarding Extremities: no edema or skin discoloration or tenderness  Pelvic:  Bartholin, Urethra, Skene Glands: Within normal limits             Vagina: No gross lesions or discharge  Cervix: No gross lesions or discharge  Uterus  anteverted, normal size, shape and consistency, non-tender and mobile  Adnexa  Without masses or tenderness  Anus and perineum  normal   Rectovaginal  normal sphincter tone without palpated masses or tenderness             Hemoccult obtained results pending at time of this dictation     Assessment/Plan:  55 y.o. female for annual exam with chronic left lower quadrant pain. Ultrasound done today demonstrated uterus that measured 4.6 x 3.3 x 2.2 cm with an endometrial stripe of 0.9 mm a small myoma intramural measuring 11 x 5 mm was noted otherwise normal right and left ovary with the exception of 2 small calcifications on the left ovary. We'll switch patient from the oral estrogen to Estrace 1 mg daily and she will at the Provera 10 mg daily for  10 days of the month. I've given her literature formation on diagnostic laparoscopy and since she is 55 years of age we may want to consider simultaneously to do bilateral salpingo-oophorectomy. She will decide look upon date let us know and we'll see her for preoperative consultation the week before surgery. Urinalysis and Pap smear was done today along with serum testosterone level and will notify does any abnormality of these tests.    Ok Edwards MD, 5:03 PM 02/17/2011

## 2011-02-24 ENCOUNTER — Telehealth: Payer: Self-pay | Admitting: *Deleted

## 2011-02-24 DIAGNOSIS — N951 Menopausal and female climacteric states: Secondary | ICD-10-CM

## 2011-02-24 MED ORDER — ESTRADIOL 0.5 MG PO TABS: 1.0000 mg | ORAL_TABLET | Freq: Every day | ORAL | Status: DC

## 2011-02-24 MED ORDER — MEDROXYPROGESTERONE ACETATE 10 MG PO TABS: 10.0000 mg | ORAL_TABLET | Freq: Every day | ORAL | Status: DC

## 2011-02-24 NOTE — Telephone Encounter (Signed)
Pt called stating pharmacy never got rx from last office visit. She would like both rx will be sent to [facility] on [address].

## 2011-02-25 ENCOUNTER — Telehealth: Payer: Self-pay | Admitting: *Deleted

## 2011-02-25 ENCOUNTER — Encounter: Payer: Self-pay | Admitting: *Deleted

## 2011-02-25 NOTE — Telephone Encounter (Signed)
PHARMACY FAXED OVER TO SEE IF PT CAN HAVE ESTRACE 0.5 MG TABLET TAKE 2 TABLES BY MOUTH DAILY WITH #60 WITH 30 DAY SUPPLY. PT JF THIS IS OKAY FOR PT TO HAVE. PHARMACY INFORMED WITH THIS.

## 2011-03-14 ENCOUNTER — Other Ambulatory Visit: Payer: Self-pay | Admitting: Internal Medicine

## 2011-03-18 ENCOUNTER — Other Ambulatory Visit: Payer: Self-pay | Admitting: Internal Medicine

## 2011-03-18 MED ORDER — SERTRALINE HCL 100 MG PO TABS: 100.0000 mg | ORAL_TABLET | Freq: Every day | ORAL | Status: DC

## 2011-03-18 NOTE — Telephone Encounter (Signed)
Duplicate Rx already sent to pharmacy.

## 2011-03-18 NOTE — Telephone Encounter (Signed)
Pt husband aware Rx sent to pharmacy.

## 2011-04-11 ENCOUNTER — Encounter: Payer: Self-pay | Admitting: *Deleted

## 2011-04-11 ENCOUNTER — Ambulatory Visit (INDEPENDENT_AMBULATORY_CARE_PROVIDER_SITE_OTHER): Payer: BC Managed Care – PPO | Admitting: Internal Medicine

## 2011-04-11 VITALS — BP 98/58 | HR 69 | Temp 98.1°F | Ht 59.0 in | Wt 140.0 lb

## 2011-04-11 DIAGNOSIS — J329 Chronic sinusitis, unspecified: Secondary | ICD-10-CM

## 2011-04-11 DIAGNOSIS — J019 Acute sinusitis, unspecified: Secondary | ICD-10-CM

## 2011-04-11 MED ORDER — AMOXICILLIN 500 MG PO CAPS: 1000.0000 mg | ORAL_CAPSULE | Freq: Two times a day (BID) | ORAL | Status: AC

## 2011-04-11 NOTE — Progress Notes (Signed)
  Subjective:    Patient ID: Jamie Patrick, female    DOB: Nov 01, 1955, 56 y.o.   MRN: [id number]  HPI  Acute visit Symptoms started 4 days ago with nasal congestion, mild sore throat, lack of appetite, fatigue. Dayquil and NyQuil are not helping much.  Past Medical History  Diagnosis Date  . Anxiety and depression   . Hypothyroidism   . Allergic rhinitis   . CTS (carpal tunnel syndrome)     question of  . Vitamin D deficiency   . Osteopenia     as reported by pt, DEXA @ gyn  . Colon polyps     BENIGN  . Dysplasia of cervix, low grade (CIN 1)     CIN-1/CIN2  . Depression   . Anxiety      Review of Systems No fevers, some chills. No nausea, vomiting or diarrhea No myalgia or chest congestion. Some cough. Husband has similar but less severe symptoms.     Objective:   Physical Exam  Constitutional: She appears well-developed and well-nourished. No distress.  HENT:  Head: Normocephalic and atraumatic.  Right Ear: External ear normal.  Left Ear: External ear normal.       Nose quite congested, throat without redness, tympanic membranes normal. Face symmetric and not tender to palpation  Cardiovascular: Normal rate, regular rhythm and normal heart sounds.   No murmur heard. Pulmonary/Chest: Breath sounds normal. No respiratory distress. She has no wheezes. She has no rales.  Musculoskeletal: She exhibits no edema.  Skin: She is not diaphoretic.      Assessment & Plan:  Acute sinusitis: Recommend to stop DayQuil and NyQuil, use Mucinex DM and pseudoephedrine instead, see  instructions

## 2011-04-11 NOTE — Patient Instructions (Signed)
Rest, fluids , tylenol For cough, take Mucinex DM twice a day as needed  For congestion use Sudafed (pseudoephedrine) behind the counter 30 mg every 4 to 6 hours as needed Take the antibiotic as prescribed ----> Amoxicillin Call if no better in few days Call anytime if the symptoms are severe  

## 2011-04-13 ENCOUNTER — Encounter: Payer: Self-pay | Admitting: Internal Medicine

## 2011-04-14 ENCOUNTER — Telehealth: Payer: Self-pay | Admitting: Internal Medicine

## 2011-04-14 ENCOUNTER — Encounter: Payer: Self-pay | Admitting: *Deleted

## 2011-04-14 MED ORDER — PREDNISONE 20 MG PO TABS: 20.0000 mg | ORAL_TABLET | Freq: Every day | ORAL | Status: AC

## 2011-04-14 NOTE — Telephone Encounter (Signed)
Discuss with patient letter faxed to Pt job [phone]

## 2011-04-14 NOTE — Telephone Encounter (Signed)
Patient husband states that patient is feeling worse and is now hoarse. Would like something else called in. [facility] [address]

## 2011-04-14 NOTE — Telephone Encounter (Signed)
Pt seen on Friday and was to return to work on today and was unable to return due to lost of voice. Pt would like to get note for additional days. .Please advise  Rx sent to pharmacy.

## 2011-04-14 NOTE — Telephone Encounter (Signed)
Okay to add today and tomorrow

## 2011-04-14 NOTE — Telephone Encounter (Signed)
Call  Prednisone 20 mg one by mouth daily, #5, no refills

## 2011-04-15 ENCOUNTER — Telehealth: Payer: Self-pay | Admitting: Internal Medicine

## 2011-04-15 NOTE — Telephone Encounter (Signed)
Patient states that she gave wrong fax#. Correct fax# [phone]. Please fax.

## 2011-04-15 NOTE — Telephone Encounter (Signed)
Note faxed to corrected fax #.

## 2011-04-28 ENCOUNTER — Encounter: Payer: Self-pay | Admitting: Internal Medicine

## 2011-04-28 ENCOUNTER — Ambulatory Visit (INDEPENDENT_AMBULATORY_CARE_PROVIDER_SITE_OTHER): Payer: BC Managed Care – PPO | Admitting: Internal Medicine

## 2011-04-28 VITALS — BP 102/60 | HR 85 | Temp 98.5°F | Resp 14 | Wt 140.2 lb

## 2011-04-28 DIAGNOSIS — J040 Acute laryngitis: Secondary | ICD-10-CM

## 2011-04-28 DIAGNOSIS — J029 Acute pharyngitis, unspecified: Secondary | ICD-10-CM

## 2011-04-28 DIAGNOSIS — R490 Dysphonia: Secondary | ICD-10-CM

## 2011-04-28 MED ORDER — PREDNISONE 10 MG PO TABS: ORAL_TABLET | ORAL | Status: DC

## 2011-04-28 MED ORDER — ALPRAZOLAM 0.5 MG PO TABS: 0.5000 mg | ORAL_TABLET | Freq: Every evening | ORAL | Status: DC | PRN

## 2011-04-28 NOTE — Progress Notes (Signed)
  Subjective:    Patient ID: Jamie Patrick, female    DOB: [birth year], 56 y.o.   MRN: [id number]  HPI Acute visit Was seen with sinusitis a few days ago, status post amoxicillin and prednisone. Overall she improved but  started with hoarseness, moderate to severe 3 days ago, Allegra and NSAIDs helped to some extent but is still unable to talk very well.   Past Medical History  Diagnosis Date  . Anxiety and depression   . Hypothyroidism   . Allergic rhinitis   . CTS (carpal tunnel syndrome)     question of  . Vitamin D deficiency   . Osteopenia     as reported by pt, DEXA @ gyn  . Colon polyps     BENIGN  . Dysplasia of cervix, low grade (CIN 1)     CIN-1/CIN2  . Depression   . Anxiety     Review of Systems Nose congestion has decreased, postnasal drip he has also decreased. No fever or chills Mild cough No chest congestion    Objective:   Physical Exam  Constitutional: She appears well-developed and well-nourished. No distress.  HENT:  Head: Normocephalic and atraumatic.  Right Ear: External ear normal.  Left Ear: External ear normal.  Mouth/Throat: No oropharyngeal exudate.       Face is symmetric, nontender to palpation  Pulmonary/Chest: Effort normal and breath sounds normal. No respiratory distress. She has no wheezes. She has no rales.  Lymphadenopathy:    She has no cervical adenopathy.  Skin: She is not diaphoretic.       Assessment & Plan:  Laryngitis: Symptoms consistent with laryngitis, she is recovering from sinusitis, doubt that more antibiotics are needed today. Plan---Prednisone, see instructions. If no better we'll consider a second dose of abx

## 2011-04-28 NOTE — Patient Instructions (Signed)
Prednisone as prescribed mucinex or mucinex DM twice a day as needed Allegra  Naproxen as needed Call if not better in few days, antibiotics?

## 2011-05-02 ENCOUNTER — Encounter: Payer: Self-pay | Admitting: Internal Medicine

## 2011-05-05 ENCOUNTER — Telehealth: Payer: Self-pay | Admitting: Internal Medicine

## 2011-05-05 NOTE — Telephone Encounter (Signed)
Please call in a Zpack  °

## 2011-05-05 NOTE — Telephone Encounter (Signed)
Patients husband reginald states that patient is not feeling any better since being seen last week. Patient had a dry cough, phlegm, and cannot taste foods. Husband would like something else called into [facility] on [address].

## 2011-05-07 MED ORDER — AZITHROMYCIN 250 MG PO TABS: ORAL_TABLET | ORAL | Status: DC

## 2011-05-07 NOTE — Telephone Encounter (Signed)
Patient's husband aware rx sent in

## 2011-05-12 ENCOUNTER — Other Ambulatory Visit: Payer: Self-pay | Admitting: Internal Medicine

## 2011-05-12 MED ORDER — LEVOTHYROXINE SODIUM 50 MCG PO TABS: 50.0000 ug | ORAL_TABLET | Freq: Every day | ORAL | Status: DC

## 2011-05-12 NOTE — Telephone Encounter (Signed)
Requested a refill of Synthroid, ok #90 and 1 refills She also requests Xanax, we just did a 90 day + 1 refill. Let the patient know

## 2011-05-12 NOTE — Telephone Encounter (Signed)
Refill done.  

## 2011-05-13 ENCOUNTER — Other Ambulatory Visit: Payer: Self-pay | Admitting: *Deleted

## 2011-05-13 DIAGNOSIS — N951 Menopausal and female climacteric states: Secondary | ICD-10-CM

## 2011-05-13 MED ORDER — ESTRADIOL 0.5 MG PO TABS: 1.0000 mg | ORAL_TABLET | Freq: Every day | ORAL | Status: DC

## 2011-05-13 MED ORDER — MEDROXYPROGESTERONE ACETATE 10 MG PO TABS: 10.0000 mg | ORAL_TABLET | Freq: Every day | ORAL | Status: DC

## 2011-05-13 NOTE — Telephone Encounter (Signed)
Pt called stating she needed refill on estradiol 0.5 mg tablets 2 by mouth. Provera 10 mg first 10 days of month. rx sent to pharmacy.

## 2011-05-21 ENCOUNTER — Other Ambulatory Visit: Payer: Self-pay | Admitting: Internal Medicine

## 2011-05-21 MED ORDER — ALPRAZOLAM 0.5 MG PO TABS: 0.5000 mg | ORAL_TABLET | Freq: Every evening | ORAL | Status: DC | PRN

## 2011-05-21 MED ORDER — LEVOTHYROXINE SODIUM 50 MCG PO TABS: 50.0000 ug | ORAL_TABLET | Freq: Every day | ORAL | Status: DC

## 2011-05-21 NOTE — Telephone Encounter (Signed)
Refill done.  

## 2011-05-21 NOTE — Telephone Encounter (Signed)
The pt called and is hoping to get a refill on Alprazolam .5mg  and Levothyroxine sent to [facility] on [address].   Thanks!

## 2011-05-23 ENCOUNTER — Telehealth: Payer: Self-pay | Admitting: Internal Medicine

## 2011-05-23 NOTE — Telephone Encounter (Signed)
Pt's husband called stating pharmacy received authorization from [place] for refill of alprazolam but not levothyroxine. See phone message from 05-21-11

## 2011-05-23 NOTE — Telephone Encounter (Signed)
Spoke with pharmacy & they said they have the prescription.

## 2011-05-26 ENCOUNTER — Other Ambulatory Visit: Payer: Self-pay | Admitting: Internal Medicine

## 2011-05-26 ENCOUNTER — Telehealth: Payer: Self-pay | Admitting: Internal Medicine

## 2011-05-26 NOTE — Telephone Encounter (Signed)
Refill done.  

## 2011-05-26 NOTE — Telephone Encounter (Signed)
Patients husband reginald called stating that he just now spoke to [facility] pharmacy and pharmacy told him that they still do not have the rx for levothyroxine

## 2011-05-27 NOTE — Telephone Encounter (Signed)
Left msg on pt's vmail that the prescription is ready to be picked at [facility] on [address]. Spoke to pharmacy to confirm.

## 2011-07-07 ENCOUNTER — Telehealth: Payer: Self-pay | Admitting: Internal Medicine

## 2011-07-07 NOTE — Telephone Encounter (Signed)
Patient states she is due for labs. What does she need?

## 2011-07-07 NOTE — Telephone Encounter (Signed)
Please advise 

## 2011-07-07 NOTE — Telephone Encounter (Signed)
Schedule her 6 month check up, fasting, will do labs then

## 2011-07-08 NOTE — Telephone Encounter (Signed)
Please schedule. Thank you

## 2011-07-10 ENCOUNTER — Telehealth: Payer: Self-pay | Admitting: Internal Medicine

## 2011-07-10 NOTE — Telephone Encounter (Signed)
Caller: [place]/Patient; PCP: Willow Ora; CB#: [phone]; ; ; Call regarding Headache, Dizzy (after 1PM Call [phone]);  Has headache since 4-3. Has been dizzy 4-4. Pain is at forehead and feels pressure. No congestion. Afebrile.  Dizziness is intermittent. Dizziness subsided after dring fluid/taking Tylenol. Is not dizzy at time of call. Tylenol has relieved pain. Home care advice given per Headache protocol.

## 2011-07-14 NOTE — Telephone Encounter (Signed)
Noted  

## 2011-07-17 ENCOUNTER — Ambulatory Visit (INDEPENDENT_AMBULATORY_CARE_PROVIDER_SITE_OTHER): Payer: BC Managed Care – PPO | Admitting: Internal Medicine

## 2011-07-17 ENCOUNTER — Ambulatory Visit: Payer: BC Managed Care – PPO | Admitting: Internal Medicine

## 2011-07-17 VITALS — BP 112/78 | HR 78 | Temp 97.6°F | Wt 136.0 lb

## 2011-07-17 DIAGNOSIS — F329 Major depressive disorder, single episode, unspecified: Secondary | ICD-10-CM

## 2011-07-17 DIAGNOSIS — E039 Hypothyroidism, unspecified: Secondary | ICD-10-CM

## 2011-07-17 MED ORDER — ESCITALOPRAM OXALATE 10 MG PO TABS: ORAL_TABLET | ORAL | Status: DC

## 2011-07-17 NOTE — Assessment & Plan Note (Signed)
Currently with more anxiety than depression. See history of present illness. Off medications for at least 2 weeks. Plan: Counseled here today. Recommend to get a counselor, possibly through her job Restart SSRIs, Lexapro worked very well for her in the past, prescription sent. Return to the office in one month.

## 2011-07-17 NOTE — Assessment & Plan Note (Signed)
Due for labs

## 2011-07-17 NOTE — Progress Notes (Signed)
  Subjective:    Patient ID: Jamie Patrick, female    DOB: 1955/04/14, 56 y.o.   MRN: [id number]  HPI Her chief complaint today is anxiety. She was doing relatively well until 2 months ago when her job become more demanding, she is having a hard time retaining information and learning a new way to work. Her productivity is down, she is afraid that she may lose her job. All these is happening in the background of chronic anxiety, depression, financial issues and her husband w/  multiple medical problems. She was taking Zoloft as prescribed, run out 3 weeks ago, she temporarily took a citalopram leftover for one week. On no medicines since then.  Past Medical History  Diagnosis Date  . Anxiety and depression   . Hypothyroidism   . Allergic rhinitis   . CTS (carpal tunnel syndrome)     question of  . Vitamin d deficiency   . Osteopenia     as reported by pt, DEXA @ gyn  . Colon polyps     BENIGN  . Dysplasia of cervix, low grade (CIN 1)     CIN-1/CIN2  . Depression   . Anxiety     Review of Systems Denies history of ADHD type of symptoms No suicidal ideas Thinks that in general , her memory is decreased.     Objective:   Physical Exam Alert, oriented x3. Coherent, cooperative, moderate emotional distress, tearful.       Assessment & Plan:  Today , I spent more than 25 min with the patient, >50% of the time counseling, she also has a number of other symptoms, she herself thinks they are related to anxiety. She has a daily headache, not the worst of her life. Reassess other symptoms when she comes back

## 2011-07-18 ENCOUNTER — Encounter: Payer: Self-pay | Admitting: *Deleted

## 2011-07-18 ENCOUNTER — Encounter: Payer: Self-pay | Admitting: Internal Medicine

## 2011-07-21 ENCOUNTER — Other Ambulatory Visit: Payer: Self-pay | Admitting: *Deleted

## 2011-07-21 ENCOUNTER — Ambulatory Visit: Payer: BC Managed Care – PPO | Admitting: Internal Medicine

## 2011-07-21 NOTE — Telephone Encounter (Signed)
Pt husband called back stating that he found Rx bottle in cabinet so we can disregard refill request.

## 2011-07-21 NOTE — Telephone Encounter (Signed)
Last OV 07-17-11, last filled 05-21-11 #180 1

## 2011-08-04 ENCOUNTER — Emergency Department (INDEPENDENT_AMBULATORY_CARE_PROVIDER_SITE_OTHER): Payer: Worker's Compensation

## 2011-08-04 ENCOUNTER — Encounter (HOSPITAL_BASED_OUTPATIENT_CLINIC_OR_DEPARTMENT_OTHER): Payer: Self-pay | Admitting: *Deleted

## 2011-08-04 ENCOUNTER — Emergency Department (HOSPITAL_BASED_OUTPATIENT_CLINIC_OR_DEPARTMENT_OTHER)
Admission: EM | Admit: 2011-08-04 | Discharge: 2011-08-04 | Disposition: A | Discharge status: Home / Self Care | Payer: Worker's Compensation | Attending: Emergency Medicine | Admitting: Emergency Medicine

## 2011-08-04 ENCOUNTER — Ambulatory Visit (HOSPITAL_BASED_OUTPATIENT_CLINIC_OR_DEPARTMENT_OTHER): Payer: Worker's Compensation | Attending: Emergency Medicine

## 2011-08-04 DIAGNOSIS — W19XXXA Unspecified fall, initial encounter: Secondary | ICD-10-CM | POA: Insufficient documentation

## 2011-08-04 DIAGNOSIS — M79609 Pain in unspecified limb: Secondary | ICD-10-CM | POA: Insufficient documentation

## 2011-08-04 DIAGNOSIS — G8929 Other chronic pain: Secondary | ICD-10-CM | POA: Insufficient documentation

## 2011-08-04 DIAGNOSIS — Y9269 Other specified industrial and construction area as the place of occurrence of the external cause: Secondary | ICD-10-CM | POA: Insufficient documentation

## 2011-08-04 DIAGNOSIS — S8990XA Unspecified injury of unspecified lower leg, initial encounter: Secondary | ICD-10-CM

## 2011-08-04 DIAGNOSIS — M25569 Pain in unspecified knee: Secondary | ICD-10-CM | POA: Insufficient documentation

## 2011-08-04 DIAGNOSIS — M7989 Other specified soft tissue disorders: Secondary | ICD-10-CM

## 2011-08-04 DIAGNOSIS — S8000XA Contusion of unspecified knee, initial encounter: Secondary | ICD-10-CM | POA: Insufficient documentation

## 2011-08-04 DIAGNOSIS — F341 Dysthymic disorder: Secondary | ICD-10-CM | POA: Insufficient documentation

## 2011-08-04 DIAGNOSIS — E039 Hypothyroidism, unspecified: Secondary | ICD-10-CM | POA: Insufficient documentation

## 2011-08-04 DIAGNOSIS — M542 Cervicalgia: Secondary | ICD-10-CM | POA: Insufficient documentation

## 2011-08-04 DIAGNOSIS — S6990XA Unspecified injury of unspecified wrist, hand and finger(s), initial encounter: Secondary | ICD-10-CM

## 2011-08-04 DIAGNOSIS — M79643 Pain in unspecified hand: Secondary | ICD-10-CM

## 2011-08-04 MED ORDER — IBUPROFEN 600 MG PO TABS: 600.0000 mg | ORAL_TABLET | Freq: Four times a day (QID) | ORAL | Status: AC | PRN

## 2011-08-04 MED ORDER — OXYCODONE-ACETAMINOPHEN 5-325 MG PO TABS
2.0000 | ORAL_TABLET | Freq: Once | ORAL | Status: AC
  Administered 2011-08-04: 2 via ORAL
  Filled 2011-08-04: qty 2

## 2011-08-04 MED ORDER — OXYCODONE-ACETAMINOPHEN 5-325 MG PO TABS: 2.0000 | ORAL_TABLET | ORAL | Status: AC | PRN

## 2011-08-04 NOTE — ED Provider Notes (Signed)
History   This chart was scribed for Forbes Cellar, MD by Melba Coon. The patient was seen in room MH02/MH02 and the patient's care was started at 8:22PM.    CSN: [id number]  Arrival date & time 08/04/11  1747   First MD Initiated Contact with Patient 08/04/11 2002      No chief complaint on file.   (Consider location/radiation/quality/duration/timing/severity/associated sxs/prior treatment) HPI Jamie Patrick is a 56 y.o. female who presents to the Emergency Department complaining of constant, moderate to severe right hand and left knee pain with an onset 3 hours ago pertaining to a fall, no head contact, no LOC.  Joss Stoney Bang, RN 08/04/2011 17:55  Walking to the bathroom about an hour ago and fell. Injury to her right hand and left knee. Radial pulse.  Pt was walking to the bathroom when she fell. Pt doesn't know how she fell. Pt was ambulatory with a limp and pain after event. HA in the morning but not at time of exam. No Hx of seizures. Chronic neck pain present to baseline. No wrist pain. No fever, neck pain, sore throat, rash, back pain, SOB, abd pain, n/v/d, dysuria, or extremity edema, weakness, numbness, or tingling.  No other pertinent medical symptoms.  Past Medical History  Diagnosis Date  . Anxiety and depression   . Hypothyroidism   . Allergic rhinitis   . CTS (carpal tunnel syndrome)     question of  . Vitamin d deficiency   . Osteopenia     as reported by pt, DEXA @ gyn  . Colon polyps     BENIGN  . Dysplasia of cervix, low grade (CIN 1)     CIN-1/CIN2  . Depression   . Anxiety     Past Surgical History  Procedure Date  . Cholecystectomy 2010  . Tonsillectomy   . Uterine polyps 2003    Dr.Fernadez/ RESECTOSCOPIC POLYPECTOMY  . Cryotherapy     FOR CERVICAL DYSPLASIA CIN-1/CIN-2  . Colonoscopy     repeat in 5 years    Family History  Problem Relation Age of Onset  . Diabetes Brother     uncles, GM  . Hypertension Brother   .  Heart attack Neg Hx   . Colon cancer Neg Hx   . Breast cancer      distant cousins  . Osteoporosis Sister     2 sisters     History  Substance Use Topics  . Smoking status: Never Smoker   . Smokeless tobacco: Never Used  . Alcohol Use: No          OB History    Grav Para Term Preterm Abortions TAB SAB Ect Mult Living   0               Review of Systems 10 Systems reviewed and all are negative for acute change except as noted in the HPI.   Allergies  Ceftriaxone sodium; Cephalosporins; and Sulfonamide derivatives  Home Medications   Current Outpatient Rx  Name Route Sig Dispense Refill  . ALPRAZOLAM 0.5 MG PO TABS Oral Take 1 tablet (0.5 mg total) by mouth at bedtime as needed for sleep. 1-2 by mouth at bedtime as needed for insomnia. 180 tablet 1  . CALCIUM + D PO Oral Take by mouth.      . CYCLOBENZAPRINE HCL 5 MG PO TABS Oral Take 1 tablet (5 mg total) by mouth 2 (two) times daily as needed. 60 tablet 1  . ESCITALOPRAM OXALATE  10 MG PO TABS  1 tablet a day x 3 weeks , then 1.5 tabs a day 45 tablet 1  . LEVOTHYROXINE SODIUM 50 MCG PO TABS Oral Take 1 tablet (50 mcg total) by mouth daily. 30 tablet 6  . NAPROXEN SODIUM 220 MG PO TABS Oral Take 220 mg by mouth 2 (two) times daily with a meal. Patient used this medication for her headache.    . ESTRADIOL 0.5 MG PO TABS Oral Take 2 tablets (1 mg total) by mouth daily. 60 tablet 2  . IBUPROFEN 600 MG PO TABS Oral Take 1 tablet (600 mg total) by mouth every 6 (six) hours as needed for pain. 30 tablet 0  . OXYCODONE-ACETAMINOPHEN 5-325 MG PO TABS Oral Take 2 tablets by mouth every 4 (four) hours as needed for pain. 20 tablet 0    BP 125/68  Pulse 66  Temp(Src) 97.8 F (36.6 C) (Oral)  Resp 18  Wt 137 lb (62.143 kg)  SpO2 99%  LMP 02/03/2009  Physical Exam  Nursing note and vitals reviewed. Constitutional: She is oriented to person, place, and time. She appears well-developed and well-nourished.       Awake, alert,  nontoxic appearance.  HENT:  Head: Normocephalic and atraumatic.  Eyes: EOM are normal. Pupils are equal, round, and reactive to light. Right eye exhibits no discharge. Left eye exhibits no discharge.  Neck: Normal range of motion. Neck supple.  Cardiovascular: Normal rate, regular rhythm and normal heart sounds.  Exam reveals no gallop and no friction rub.   No murmur heard.      DP and PT pulses intact  Pulmonary/Chest: Effort normal. She has no wheezes. She has no rales. She exhibits tenderness (Anterior chest TTP).  Abdominal: Soft. There is no tenderness. There is no rebound.       Abd benign  Musculoskeletal: She exhibits tenderness (TTP in proximal interphalangeal joints of right hand, digits 2, 3, and 4; TTP in lateral aspect of left knee).       Full ROM Rt wrist, radial pulse intact, no ttp.  Min ttp fingers 2-4 PIP and distally. Gross sensation intact. Cap refill < 3 sec  Rt knee with +ecchymosis over patella. +ttp. No crepitus. Full ROM with pain. Min ttp lateral joint line. anterior and posterior drawer test unremarkable. DP/PT intact  Neurological: She is alert and oriented to person, place, and time.       Mental status and motor strength appears baseline for patient and situation.  Skin: Skin is warm. No rash noted.       Ecchymosis on left patella; capillary refill less than 3 sec  Psychiatric: She has a normal mood and affect. Her behavior is normal.    ED Course  Procedures (including critical care time)  DIAGNOSTIC STUDIES: Oxygen Saturation is 99% on room air, normal by my interpretation.    COORDINATION OF CARE:  8:28PM - EDMD has reviewed imaging which is negative for any findings; EDMD will order one more image of knee and some pain meds for the pt.   Labs Reviewed - No data to display Dg Knee 1-2 Views Left  08/04/2011  *RADIOLOGY REPORT*  Clinical Data: Post fall, now with pain about the patella  LEFT KNEE - 1-2 VIEW  Comparison: The radiographic series -  earlier same day  Findings: Additional solitary provided sunrise radiograph of the knee demonstrates normal positioning of the patella within the intercondylar notch.  There is minimal soft tissue swelling about the patella  without definite fracture.  The patellofemoral joint spaces are preserved.  IMPRESSION: Minimal soft tissue swelling about the patella without associated fracture, dislocation or significant degenerative change.  Original Report Authenticated By: Waynard Reeds, M.D.   Dg Knee Complete 4 Views Left  08/04/2011  *RADIOLOGY REPORT*  Clinical Data: Larey Seat injuring left knee, pain peripatellar  LEFT KNEE - COMPLETE 4+ VIEW  Comparison: None  Findings: Mild osseous demineralization. Joint spaces grossly preserved. No definite fracture, dislocation, or bone destruction. No knee joint effusion.  IMPRESSION: No acute osseous abnormalities.  Original Report Authenticated By: Lollie Marrow, M.D.   Dg Hand Complete Right  08/04/2011  *RADIOLOGY REPORT*  Clinical Data: Fall injury right hand, pain  RIGHT HAND - COMPLETE 3+ VIEW  Comparison: None  Findings: Bone mineralization normal. Joint spaces preserved. No fracture, dislocation, or bone destruction.  IMPRESSION: No acute abnormalities.  Original Report Authenticated By: Lollie Marrow, M.D.     1. Fall   2. Hand pain   3. Contusion, knee       MDM  Likely mechanical fall with hand and knee contusion. XR unremarkable. Neurovasc intact. RICE/CRUTCHES. Follow up with PMD in 2 weeks as needed if symptoms persist.  I personally performed the services described in this documentation, which was scribed in my presence. The recorded information has been reviewed and considered.         Forbes Cellar, MD 08/04/11 2109

## 2011-08-04 NOTE — ED Notes (Signed)
Walking to the bathroom about an hour ago and fell. Injury to her right hand and left knee. Radial pulse.

## 2011-08-04 NOTE — Discharge Instructions (Signed)
Bone Bruise  A bone bruise is a small hidden fracture of the bone. It typically occurs with bones located close to the surface of the skin.  SYMPTOMS  The pain lasts longer than a normal bruise.   The bruised area is difficult to use.   There may be discoloration or swelling of the bruised area.   When a bone bruise is found with injury to the anterior cruciate ligament (in the knee) there is often an increased:   Amount of fluid in the knee   Time the fluid in the knee lasts.   Number of days until you are walking normally and regaining the motion you had before the injury.   Number of days with pain from the injury.  DIAGNOSIS  It can only be seen on X-rays known as MRIs. This stands for magnetic resonance imaging. A regular X-ray taken of a bone bruise would appear to be normal. A bone bruise is a common injury in the knee and the heel bone (calcaneus). The problems are similar to those produced by stress fractures, which are bone injuries caused by overuse. A bone bruise may also be a sign of other injuries. For example, bone bruises are commonly found where an anterior cruciate ligament (ACL) in the knee has been pulled away from the bone (ruptured). A ligament is a tough fibrous material that connects bones together to make our joints stable. Bruises of the bone last a lot longer than bruises of the muscle or tissues beneath the skin. Bone bruises can last from days to months and are often more severe and painful than other bruises. TREATMENT Because bone bruises are sudden injuries you cannot often prevent them, other than by being extremely careful. Some things you can do to improve the condition are:  Apply ice to the sore area for 15 to 20 minutes, 3 to 4 times per day while awake for the first 2 days. Put the ice in a plastic bag, and place a towel between the bag of ice and your skin.   Keep your bruised area raised (elevated) when possible to lessen swelling.   For activity:     Use crutches when necessary; do not put weight on the injured leg until you are no longer tender.   You may walk on your affected part as the pain allows, or as instructed.   Start weight bearing gradually on the bruised part.   Continue to use crutches or a cane until you can stand without causing pain, or as instructed.   If a plaster splint was applied, wear the splint until you are seen for a follow-up examination. Rest it on nothing harder than a pillow the first 24 hours. Do not put weight on it. Do not get it wet. You may take it off to take a shower or bath.   If an air splint was applied, more air may be blown into or out of the splint as needed for comfort. You may take it off at night and to take a shower or bath.   Wiggle your toes in the splint several times per day if you are able.   You may have been given an elastic bandage to use with the plaster splint or alone. The splint is too tight if you have numbness, tingling or if your foot becomes cold and blue. Adjust the bandage to make it comfortable.   Only take over-the-counter or prescription medicines for pain, discomfort, or fever as directed by   your caregiver.   Follow all instructions for follow up with your caregiver. This includes any orthopedic referrals, physical therapy, and rehabilitation. Any delay in obtaining necessary care could result in a delay or failure of the bones to heal.  SEEK MEDICAL CARE IF:   You have an increase in bruising, swelling, or pain.   You notice coldness of your toes.   You do not get pain relief with medications.  SEEK IMMEDIATE MEDICAL CARE IF:   Your toes are numb or blue.   You have severe pain not controlled with medications.   If any of the problems that caused you to seek care are becoming worse.  Document Released: 06/14/2003 Document Revised: 03/13/2011 Document Reviewed: 10/27/2007 ExitCare Patient Information 2012 [employer]  RESOURCE GUIDE  Dental  Problems  Patients with Medicaid: [facility]                     [facility] [address]                                           [address] Phone:  [phone]                                                   Phone:  [phone]  If unable to pay or uninsured, contact:  [facility] or [facility] to become qualified for the adult dental clinic.  Chronic Pain Problems Contact [facility]  [phone] Patients need to be referred by their primary care doctor.  Insufficient Money for Medicine Contact [employer]:  call "211" or [facility] 271-5999.  No Primary Care Doctor Call [facility]  832-8000 Other agencies that provide inexpensive medical care    [facility]  [phone]    [facility]  [phone]    [facility]  [phone]    [facility] Clinic  [phone]    [facility]  [phone]    [facility]  [phone]  Psychological Services [facility]  [phone] [facility]  [phone] [facility]   [phone] (emergency services 641-4993)  Abuse/Neglect [place] Child Abuse Hotline (336) 641-3795 [place] [facility] Abuse Hotline 800-378-5315 (After Hours)  Emergency Shelter [facility] (336) 271-5985  [facility] (336) 275-9566 [facility] (704) 372-4663  MRSA Hotline #:   832-7006    [place] [facility]  [facility] [place]  [employer]                           [facility] [address] [place]                     [address]         [address]  [place]                                                 [place]                              [place] Phone:  [phone]                                  Phone:  [phone]                   Phone:  [phone]  [facility] Phone:  [phone]  [place] Child Abuse Hotline (336) 342-1394 (336) 342-3537 (After Hours)  

## 2011-08-19 ENCOUNTER — Encounter: Payer: Self-pay | Admitting: Internal Medicine

## 2011-08-19 ENCOUNTER — Ambulatory Visit (INDEPENDENT_AMBULATORY_CARE_PROVIDER_SITE_OTHER): Payer: BC Managed Care – PPO | Admitting: Internal Medicine

## 2011-08-19 VITALS — BP 106/70 | HR 80 | Temp 98.4°F | Wt 142.0 lb

## 2011-08-19 DIAGNOSIS — F329 Major depressive disorder, single episode, unspecified: Secondary | ICD-10-CM

## 2011-08-19 DIAGNOSIS — G4700 Insomnia, unspecified: Secondary | ICD-10-CM

## 2011-08-19 MED ORDER — ESCITALOPRAM OXALATE 10 MG PO TABS: ORAL_TABLET | ORAL | Status: DC

## 2011-08-19 NOTE — Progress Notes (Signed)
  Subjective:    Patient ID: Jamie Patrick, female    DOB: [birth year], 56 y.o.   MRN: [id number]  HPI Followup from last visit Started Lexapro last month, feeling better, less anxious, functioning better at work.  Past Medical History  Diagnosis Date  . Anxiety and depression   . Hypothyroidism   . Allergic rhinitis   . CTS (carpal tunnel syndrome)     question of  . Vitamin d deficiency   . Osteopenia     as reported by pt, DEXA @ gyn  . Colon polyps     BENIGN  . Dysplasia of cervix, low grade (CIN 1)     CIN-1/CIN2  . Depression   . Anxiety        Review of Systems Still has very mild depression on and off. Appetite has not increased We'll see a counselor next week Sleeps okay, usually takes one alprazolam, occasionally wakes up in the middle of the night and eats because she still feels slightly anxious. Self discontinue her HRT prescribed by gynecology for hot flashes, despite discontinuation of HRT hot flashes have not come back    Objective:   Physical Exam Alert, oriented x3. No apparent distress. She seems to be in good spirits, more optimistic, not anxious or depressed appearing       Assessment & Plan:  Today , I spent more than 15 min with the patient, >50% of the time counseling

## 2011-08-19 NOTE — Assessment & Plan Note (Signed)
Usually takes one Xanax at night, occasionally wakes up, advice patient is okay to take 2 Xanax if needed

## 2011-08-19 NOTE — Assessment & Plan Note (Addendum)
Improving, less anxious, to see a counselor soon. Functioning better at work. Patient is counseled today, continue with Lexapro 15 mg daily.

## 2011-08-28 ENCOUNTER — Ambulatory Visit: Payer: Self-pay | Admitting: Licensed Clinical Social Worker

## 2011-10-28 ENCOUNTER — Ambulatory Visit (INDEPENDENT_AMBULATORY_CARE_PROVIDER_SITE_OTHER): Payer: BC Managed Care – PPO | Admitting: Internal Medicine

## 2011-10-28 VITALS — BP 120/76 | HR 65 | Temp 97.8°F | Ht <= 58 in | Wt 137.0 lb

## 2011-10-28 DIAGNOSIS — F329 Major depressive disorder, single episode, unspecified: Secondary | ICD-10-CM

## 2011-10-28 DIAGNOSIS — Z0000 Encounter for general adult medical examination without abnormal findings: Secondary | ICD-10-CM

## 2011-10-28 LAB — LIPID PANEL
Cholesterol: 178 mg/dL (ref 0–200)
HDL: 41.8 mg/dL (ref >39.00)
Triglycerides: 96 mg/dL (ref 0.0–149.0)

## 2011-10-28 MED ORDER — LORAZEPAM 1 MG PO TABS: 2.0000 mg | ORAL_TABLET | Freq: Every evening | ORAL | Status: DC | PRN

## 2011-10-28 MED ORDER — ESCITALOPRAM OXALATE 20 MG PO TABS: 20.0000 mg | ORAL_TABLET | Freq: Every day | ORAL | Status: DC

## 2011-10-28 MED ORDER — LEVOTHYROXINE SODIUM 50 MCG PO TABS: 50.0000 ug | ORAL_TABLET | Freq: Every day | ORAL | Status: DC

## 2011-10-28 NOTE — Assessment & Plan Note (Addendum)
Continue with anxiety >> depression, unable to sleep despite taking sometimes 2 or 3 alprazolam. Denies any suicidal ideas  Currently on Lexapro 10 mg 1.5 tablets M-F, only one tablet  Saturday and Sunday because is less stressed. Was unable to afford seeing a counselor. I counseled the her here to the best of my ability. Increase Lexapro to 20 mg every day Change alprazolam to Ativan. Prescription sig 2 qhs prn, wil take 1-2 as needed Reassess in 6 weeks

## 2011-10-28 NOTE — Progress Notes (Signed)
  Subjective:    Patient ID: Neomia Dear, female    DOB: 19-Mar-1956, 56 y.o.   MRN: [id number]  HPI Here for a CPX We also had a long discussion about anxiety and depression. See assessment and plan.  Past Medical History: Anxiety and depression Hypothyroidism Allergic rhinitis CTS ? Osteopenia per bone density test at gynecology History of CIN 1, 2  Past Surgical History: Cholecystectomy Tonsillectomy uterine polyps aprox 2003 , Dr Lily Peer   Family History: DM-- uncle, brother, GM MI--no Colon ca-- no breast  ca-- distant cousins   Social History: original from [place], no children  married at age 76 no tobacco, no ETOH Diet improving Not exercising    Review of Systems No chest pain or shortness of breath No cough or chest congestion No nausea vomiting diarrhea or blood in the stools No dysuria, gross hematuria or difficulty urinating. Sometimes pain in the hips when she gets up from a chair. (Recommend Aleve)    Objective:   Physical Exam General -- alert, well-developed   Neck --no thyromegaly Lungs -- normal respiratory effort, no intercostal retractions, no accessory muscle use, and normal breath sounds.   Heart-- normal rate, regular rhythm, no murmur, and no gallop.   Abdomen--soft, non-tender, no distention, no masses, no HSM, no guarding, and no rigidity.   Extremities-- no pretibial edema bilaterally Neurologic-- alert & oriented X3 and strength normal in all extremities. Psych-- Cognition and judgment appear intact. Alert and cooperative with normal attention span and concentration. Patient was quite emotional today, she cried during part of the interview    Assessment & Plan:

## 2011-10-28 NOTE — Assessment & Plan Note (Addendum)
Td aprox 2006 per patient   Saw gyn few months ago 8-12  MMG (-) Cscope Dr [address], aprox 2006 and again 04-2010, next in 5 years per report   counseled ref diet - exercise  Labs

## 2011-10-31 ENCOUNTER — Telehealth: Payer: Self-pay | Admitting: Internal Medicine

## 2011-10-31 ENCOUNTER — Encounter: Payer: Self-pay | Admitting: *Deleted

## 2011-10-31 MED ORDER — TRAZODONE HCL 50 MG PO TABS: 50.0000 mg | ORAL_TABLET | Freq: Every evening | ORAL | Status: DC | PRN

## 2011-10-31 NOTE — Telephone Encounter (Signed)
Discussed with pt

## 2011-10-31 NOTE — Telephone Encounter (Signed)
Discontinue ativan and Xanax. I sent a prescription for trazodone, can take one or 2 tablets at bedtime. I strongly recommend not to mix medicines.

## 2011-10-31 NOTE — Telephone Encounter (Signed)
Please advise 

## 2011-10-31 NOTE — Telephone Encounter (Signed)
Caller: Reginald/Spouse; PCP: Willow Ora; CB#: [phone];  Call regarding Insomnia; Pt. Was seen on 10/29/11 for this.   Pt.s husband calling concerned about pt.s inabliity to get to sleep.  He states  Pt. took 2 Ativan 1mg  tabs, and 1 Xanax 0.5mg  and could not fall asleep until 11:30.  He is asking to let Dr. Drue Novel know that this does not seem to be helping.  Pt. is a work today, and can be reached at:  [phone].  CAN/DB

## 2011-11-25 ENCOUNTER — Other Ambulatory Visit: Payer: Self-pay | Admitting: Gynecology

## 2011-11-25 DIAGNOSIS — Z1231 Encounter for screening mammogram for malignant neoplasm of breast: Secondary | ICD-10-CM

## 2011-12-16 ENCOUNTER — Encounter: Payer: Self-pay | Admitting: Internal Medicine

## 2011-12-16 ENCOUNTER — Ambulatory Visit (INDEPENDENT_AMBULATORY_CARE_PROVIDER_SITE_OTHER): Payer: BC Managed Care – PPO | Admitting: Internal Medicine

## 2011-12-16 ENCOUNTER — Other Ambulatory Visit: Payer: Self-pay | Admitting: Internal Medicine

## 2011-12-16 VITALS — BP 108/68 | HR 71 | Temp 98.1°F | Wt 139.0 lb

## 2011-12-16 DIAGNOSIS — G4700 Insomnia, unspecified: Secondary | ICD-10-CM

## 2011-12-16 DIAGNOSIS — F329 Major depressive disorder, single episode, unspecified: Secondary | ICD-10-CM

## 2011-12-16 MED ORDER — ALPRAZOLAM 0.5 MG PO TABS: ORAL_TABLET | ORAL | Status: DC

## 2011-12-16 NOTE — Progress Notes (Signed)
  Subjective:    Patient ID: Jamie Patrick, female    DOB: [birth year], 56 y.o.   MRN: [id number]  HPI Followup from previous visit, we increased Lexapro, she seems to be doing well. As far as insomnia, Ativan didn't work, trazodone did not help her---> dried her nose and made her "hyper". She is back on Xanax and able to sleep 7-8 hours.  Past Medical History: Anxiety and depression Hypothyroidism Allergic rhinitis CTS ? Osteopenia per bone density test at gynecology History of CIN 1, 2  Past Surgical History: Cholecystectomy Tonsillectomy uterine polyps aprox 2003 , Dr Lily Peer   Family History: DM-- uncle, brother, GM MI--no Colon ca-- no breast  ca-- distant cousins   Social History: original from [place], no children   married at age 32 no tobacco, no ETOH  Review of Systems Good sleep habits, does not eat late, no late caffeine intake, does not watch TV in bed     Objective:   Physical Exam Alert oriented x3, no apparent distress. In general, she seems to be doing much better emotionally, no obvious anxiety or depression     Assessment & Plan:

## 2011-12-16 NOTE — Patient Instructions (Addendum)
Lexapro is 20 mg daily Continue with Xanax as needed for sleep Come back in 3 months

## 2011-12-16 NOTE — Assessment & Plan Note (Addendum)
Improving compared to last visit , sources of depression (husband's health and leg issues, job) have not changed but she is handling things better. She was prescribed 20 mg daily, she is taking "2 tablets" not sure if tablets are 10 or 20 mg . She will check her tablet size and be sure she takes only 20 mg a day.

## 2011-12-16 NOTE — Telephone Encounter (Signed)
Spoke with pharmacy & they did not receive previous refill. Re-sent refill to pharmacy.

## 2011-12-16 NOTE — Assessment & Plan Note (Addendum)
At the last visit we changed Xanax to ativan, didn't help. Then she tried trazodone which cause dry nose and made her hyper, she is back on Xanax and doing relatively well, sleeps 7-8 hours. Plan: Continue with Xanax

## 2011-12-19 ENCOUNTER — Ambulatory Visit (HOSPITAL_COMMUNITY)
Admission: RE | Admit: 2011-12-19 | Discharge: 2011-12-19 | Disposition: A | Discharge status: Home / Self Care | Payer: BC Managed Care – PPO | Source: Ambulatory Visit | Attending: Gynecology | Admitting: Gynecology

## 2011-12-19 DIAGNOSIS — Z1231 Encounter for screening mammogram for malignant neoplasm of breast: Secondary | ICD-10-CM | POA: Insufficient documentation

## 2012-02-24 ENCOUNTER — Encounter: Payer: Self-pay | Admitting: Gynecology

## 2012-03-02 ENCOUNTER — Encounter: Payer: Self-pay | Admitting: Gynecology

## 2012-03-02 ENCOUNTER — Ambulatory Visit (INDEPENDENT_AMBULATORY_CARE_PROVIDER_SITE_OTHER): Payer: BC Managed Care – PPO | Admitting: Gynecology

## 2012-03-02 ENCOUNTER — Other Ambulatory Visit (HOSPITAL_COMMUNITY)
Admission: RE | Admit: 2012-03-02 | Discharge: 2012-03-02 | Disposition: A | Discharge status: Home / Self Care | Payer: BC Managed Care – PPO | Source: Ambulatory Visit | Attending: Gynecology | Admitting: Gynecology

## 2012-03-02 VITALS — BP 118/66 | Ht <= 58 in | Wt 140.0 lb

## 2012-03-02 DIAGNOSIS — R635 Abnormal weight gain: Secondary | ICD-10-CM

## 2012-03-02 DIAGNOSIS — Z1151 Encounter for screening for human papillomavirus (HPV): Secondary | ICD-10-CM | POA: Insufficient documentation

## 2012-03-02 DIAGNOSIS — M8580 Other specified disorders of bone density and structure, unspecified site: Secondary | ICD-10-CM | POA: Insufficient documentation

## 2012-03-02 DIAGNOSIS — M899 Disorder of bone, unspecified: Secondary | ICD-10-CM

## 2012-03-02 DIAGNOSIS — Z8639 Personal history of other endocrine, nutritional and metabolic disease: Secondary | ICD-10-CM

## 2012-03-02 DIAGNOSIS — M949 Disorder of cartilage, unspecified: Secondary | ICD-10-CM

## 2012-03-02 DIAGNOSIS — Z01419 Encounter for gynecological examination (general) (routine) without abnormal findings: Secondary | ICD-10-CM

## 2012-03-02 LAB — HEMOGLOBIN A1C: Mean Plasma Glucose: 117 mg/dL — ABNORMAL HIGH (ref <117)

## 2012-03-02 LAB — CBC WITH DIFFERENTIAL/PLATELET
Basophils Absolute: 0 10*3/uL (ref 0.0–0.1)
Eosinophils Absolute: 0.1 10*3/uL (ref 0.0–0.7)
Eosinophils Relative: 2 % (ref 0–5)
HCT: 37.5 % (ref 36.0–46.0)
Lymphocytes Relative: 52 % — ABNORMAL HIGH (ref 12–46)
Lymphs Abs: 2.3 10*3/uL (ref 0.7–4.0)
MCH: 30.4 pg (ref 26.0–34.0)
MCV: 89.1 fL (ref 78.0–100.0)
Monocytes Absolute: 0.2 10*3/uL (ref 0.1–1.0)
RDW: 14.6 % (ref 11.5–15.5)
WBC: 4.4 10*3/uL (ref 4.0–10.5)

## 2012-03-02 NOTE — Progress Notes (Signed)
Jamie Patrick October 20, 1955 [id number]   History:    56 y.o.  for annual gyn exam with no complaints. She had informed me that last year she discontinued hormonal replacement therapy which consisted of an elestrin 0.06% transdermal he to one arm daily with the addition of progestational agent for 12 days of the month. She denies any vasomotor symptoms or any vaginal dryness. She is not sexually active. Dr. Drue Novel is her primary physician who has been monitor her hypothyroidism and doing her labs. Review of her record indicated in the past she has had vitamin D deficiency in her last bone density study was in December 2011 in her lowest T score was at the left femoral neck with a value of -1.6 with normal Frax indices. She has past history of colon polyps but her last colonoscopy was normal in 2012. Her mammogram was normal this year and she does her monthly self breast examination. She received recently the flu vaccine.  Review of her record indicated that she has a history of CIN-1/CIN-2 back in 2000 for which she had been treated with LEEP cervical conization and a few years later for mild dysplasia had cryotherapy. Her followup Pap smears have been normal. Review of her record also indicated she had been on hormone replacement therapy from 2005 until 2012.  Past medical history,surgical history, family history and social history were all reviewed and documented in the EPIC chart.  Gynecologic History Patient's last menstrual period was 02/03/2009. Contraception: post menopausal status Last Pap: 2012. Results were: normal Last mammogram: 2013. Results were: normal  Obstetric History OB History    Grav Para Term Preterm Abortions TAB SAB Ect Mult Living   0                ROS: A ROS was performed and pertinent positives and negatives are included in the history.  GENERAL: No fevers or chills. HEENT: No change in vision, no earache, sore throat or sinus congestion. NECK: No pain or stiffness.  CARDIOVASCULAR: No chest pain or pressure. No palpitations. PULMONARY: No shortness of breath, cough or wheeze. GASTROINTESTINAL: No abdominal pain, nausea, vomiting or diarrhea, melena or bright red blood per rectum. GENITOURINARY: No urinary frequency, urgency, hesitancy or dysuria. MUSCULOSKELETAL: No joint or muscle pain, no back pain, no recent trauma. DERMATOLOGIC: No rash, no itching, no lesions. ENDOCRINE: No polyuria, polydipsia, no heat or cold intolerance. No recent change in weight. HEMATOLOGICAL: No anemia or easy bruising or bleeding. NEUROLOGIC: No headache, seizures, numbness, tingling or weakness. PSYCHIATRIC: No depression, no loss of interest in normal activity or change in sleep pattern.     Exam: chaperone present  BP 118/66  Ht 4' 9.75" (1.467 m)  Wt 140 lb (63.504 kg)  BMI 29.51 kg/m2  LMP 02/03/2009  Body mass index is 29.51 kg/(m^2).  General appearance : Well developed well nourished female. No acute distress HEENT: Neck supple, trachea midline, no carotid bruits, no thyroidmegaly Lungs: Clear to auscultation, no rhonchi or wheezes, or rib retractions  Heart: Regular rate and rhythm, no murmurs or gallops Breast:Examined in sitting and supine position were symmetrical in appearance, no palpable masses or tenderness,  no skin retraction, no nipple inversion, no nipple discharge, no skin discoloration, no axillary or supraclavicular lymphadenopathy Abdomen: no palpable masses or tenderness, no rebound or guarding Extremities: no edema or skin discoloration or tenderness  Pelvic:  Bartholin, Urethra, Skene Glands: Within normal limits  Vagina: No gross lesions or discharge, atrophic changes  Cervix: No gross lesions or discharge  Uterus  axial, normal size, shape and consistency, non-tender and mobile  Adnexa  Without masses or tenderness  Anus and perineum  normal   Rectovaginal  normal sphincter tone without palpated masses or tenderness              Hemoccult  Cards provided   Cervix required dilatation an effort to insert the Cytobrush for adequate Pap smear sampling  Assessment/Plan:  56 y.o. female for annual exam with history of CIN-1 CIN-2 in 2004 followup Pap smears been normal. Patient discontinued hormone replacement therapy last year and has been asymptomatic. Because of her past history vitamin D deficiency we will check her vitamin D level today along with CBC and hemoglobin A1c. She will followup with Dr. Drue Novel who will do the rest of her labs. She was encouraged to do her monthly self breast examination. She was reminded to continue to take her calcium and vitamin D daily along with weightbearing exercises 3-4 times a week for osteoporosis prevention. She will schedule a bone density study for the this year and she will bring Hemoccult cards to the office for testing. Despite the guidelines due to her past history CIN-2 a Pap smear was done today.   Ok Edwards MD, 10:08 AM 03/02/2012

## 2012-03-07 ENCOUNTER — Ambulatory Visit (INDEPENDENT_AMBULATORY_CARE_PROVIDER_SITE_OTHER): Payer: BC Managed Care – PPO | Admitting: Family Medicine

## 2012-03-07 VITALS — BP 126/76 | HR 77 | Temp 98.6°F | Resp 17 | Ht <= 58 in | Wt 140.0 lb

## 2012-03-07 DIAGNOSIS — J029 Acute pharyngitis, unspecified: Secondary | ICD-10-CM

## 2012-03-07 DIAGNOSIS — R0981 Nasal congestion: Secondary | ICD-10-CM

## 2012-03-07 DIAGNOSIS — H9209 Otalgia, unspecified ear: Secondary | ICD-10-CM

## 2012-03-07 DIAGNOSIS — J019 Acute sinusitis, unspecified: Secondary | ICD-10-CM

## 2012-03-07 DIAGNOSIS — J3489 Other specified disorders of nose and nasal sinuses: Secondary | ICD-10-CM

## 2012-03-07 DIAGNOSIS — R51 Headache: Secondary | ICD-10-CM

## 2012-03-07 MED ORDER — NAPROXEN SODIUM 220 MG PO TABS: 220.0000 mg | ORAL_TABLET | Freq: Two times a day (BID) | ORAL | Status: DC

## 2012-03-07 MED ORDER — AMOXICILLIN-POT CLAVULANATE 875-125 MG PO TABS: 1.0000 | ORAL_TABLET | Freq: Two times a day (BID) | ORAL | Status: DC

## 2012-03-07 NOTE — Progress Notes (Signed)
[facility]:  Office Visit  Chief Complaint:  Chief Complaint  Patient presents with  . Cough  . Nasal Congestion  . Otalgia    right ear     HPI: Jamie Patrick is a 56 y.o. female who complains of  4 day h/o sinus congestion. + nasal congestion, headaches, right sided ear pain. Denies fevers, chills. Used nyquil without relief.   Past Medical History  Diagnosis Date  . Anxiety and depression   . Hypothyroidism   . Allergic rhinitis   . CTS (carpal tunnel syndrome)     question of  . Vitamin D deficiency   . Osteopenia     as reported by pt, DEXA @ gyn  . Colon polyps     BENIGN  . Dysplasia of cervix, low grade (CIN 1)     CIN-1/CIN2  . Depression   . Anxiety    Past Surgical History  Procedure Date  . Cholecystectomy 2010  . Tonsillectomy   . Uterine polyps 2003    Dr.Fernadez/ RESECTOSCOPIC POLYPECTOMY  . Cryotherapy     FOR CERVICAL DYSPLASIA CIN-1/CIN-2  . Colonoscopy     repeat in 5 years   History   Social History  . Marital Status: Married    Spouse Name: N/A    Number of Children: 0  . Years of Education: N/A   Occupational History  .     Social History Main Topics  . Smoking status: Never Smoker   . Smokeless tobacco: Never Used  . Alcohol Use: No     Comment:    . Drug Use: No  . Sexually Active: No   Other Topics Concern  . None   Social History Narrative   Original from [place]--- diet:    Family History  Problem Relation Age of Onset  . Diabetes Brother     uncles, GM  . Hypertension Brother   . Heart attack Neg Hx   . Colon cancer Neg Hx   . Breast cancer      distant cousins  . Osteoporosis Sister     2 sisters    Allergies  Allergen Reactions  . Ceftriaxone Sodium     REACTION: nausea  . Cephalosporins Nausea And Vomiting  . Sulfonamide Derivatives     REACTION: near syncope, N\T\V   Prior to Admission medications   Medication Sig Start Date End Date Taking? Authorizing Provider    ALPRAZolam Prudy Feeler) 0.5 MG tablet TAKE ONE TO TWO TABLETS BY MOUTH AT BEDTIME AS NEEDED FOR SLEEP 12/16/11  Yes Wanda Plump, MD  Calcium Carbonate-Vitamin D (CALCIUM + D PO) Take by mouth.     Yes Historical Provider, MD  fish oil-omega-3 fatty acids 1000 MG capsule Take 2 g by mouth daily.   Yes Historical Provider, MD  levothyroxine (SYNTHROID, LEVOTHROID) 50 MCG tablet Take 1 tablet (50 mcg total) by mouth daily. 10/28/11  Yes Wanda Plump, MD  Multiple Vitamin (MULTIVITAMIN) tablet Take 1 tablet by mouth daily.   Yes Historical Provider, MD  sertraline (ZOLOFT) 100 MG tablet Take 100 mg by mouth daily.   Yes Historical Provider, MD  amoxicillin-clavulanate (AUGMENTIN) 875-125 MG per tablet Take 1 tablet by mouth 2 (two) times daily. 03/07/12   Gilda Abboud P Haelee Bolen, DO  naproxen sodium (ANAPROX) 220 MG tablet Take 1 tablet (220 mg total) by mouth 2 (two) times daily with a meal. Patient used this medication for her headache. 03/07/12   Sheldon Amara P Antavion Bartoszek,  DO     ROS: The patient denies , night sweats, unintentional weight loss, chest pain, palpitations, wheezing, dyspnea on exertion, nausea, vomiting, abdominal pain, dysuria, hematuria, melena, numbness, weakness, or tingling.   All other systems have been reviewed and were otherwise negative with the exception of those mentioned in the HPI and as above.    PHYSICAL EXAM: Filed Vitals:   03/07/12 1159  BP: 126/76  Pulse: 77  Temp: 98.6 F (37 C)  Resp: 17   Filed Vitals:   03/07/12 1159  Height: 4\' 10"  (1.473 m)  Weight: 140 lb (63.504 kg)   Body mass index is 29.26 kg/(m^2).  General: Alert, no acute distress HEENT:  Normocephalic, atraumatic, oropharynx patent. EOMI, PERRLA, + sinus tenderness bilaterally. Right TM slightly erythematous. No exduates Cardiovascular:  Regular rate and rhythm, no rubs murmurs or gallops.  No Carotid bruits, radial pulse intact. No pedal edema.  Respiratory: Clear to auscultation bilaterally.  No wheezes, rales, or rhonchi.   No cyanosis, no use of accessory musculature GI: No organomegaly, abdomen is soft and non-tender, positive bowel sounds.  No masses. Skin: No rashes. Neurologic: Facial musculature symmetric. Psychiatric: Patient is appropriate throughout our interaction. Lymphatic: No cervical lymphadenopathy Musculoskeletal: Gait intact.   LABS: Results for orders placed in visit on 03/02/12  CBC WITH DIFFERENTIAL      Component Value Range   WBC 4.4  4.0 - 10.5 K/uL   RBC 4.21  3.87 - 5.11 MIL/uL   Hemoglobin 12.8  12.0 - 15.0 g/dL   HCT 81.1  91.4 - 78.2 %   MCV 89.1  78.0 - 100.0 fL   MCH 30.4  26.0 - 34.0 pg   MCHC 34.1  30.0 - 36.0 g/dL   RDW 95.6  21.3 - 08.6 %   Platelets 202  150 - 400 K/uL   Neutrophils Relative 39 (*) 43 - 77 %   Neutro Abs 1.7  1.7 - 7.7 K/uL   Lymphocytes Relative 52 (*) 12 - 46 %   Lymphs Abs 2.3  0.7 - 4.0 K/uL   Monocytes Relative 6  3 - 12 %   Monocytes Absolute 0.2  0.1 - 1.0 K/uL   Eosinophils Relative 2  0 - 5 %   Eosinophils Absolute 0.1  0.0 - 0.7 K/uL   Basophils Relative 1  0 - 1 %   Basophils Absolute 0.0  0.0 - 0.1 K/uL   Smear Review Criteria for review not met    VITAMIN D 25 HYDROXY      Component Value Range   Vit D, 25-Hydroxy 38  30 - 89 ng/mL  HEMOGLOBIN A1C      Component Value Range   Hemoglobin A1C 5.7 (*) <5.7 %   Mean Plasma Glucose 117 (*) <117 mg/dL     EKG/XRAY:   Primary read interpreted by Dr. Conley Rolls at [facility].   ASSESSMENT/PLAN: Encounter Diagnoses  Name Primary?  . Sore throat   . Ear pain   . Nasal congestion   . Headache   . Acute sinusitis Yes   Augmentin 875 mg BID x 10 days Naproxen for carpal tunnel pain prn   Sharonne Ricketts PHUONG, DO 03/07/2012 12:36 PM

## 2012-03-09 ENCOUNTER — Ambulatory Visit: Payer: Self-pay | Admitting: Internal Medicine

## 2012-03-25 ENCOUNTER — Encounter: Payer: Self-pay | Admitting: Lab

## 2012-03-26 ENCOUNTER — Ambulatory Visit (INDEPENDENT_AMBULATORY_CARE_PROVIDER_SITE_OTHER): Payer: BC Managed Care – PPO | Admitting: Internal Medicine

## 2012-03-26 ENCOUNTER — Ambulatory Visit: Payer: Self-pay | Admitting: Internal Medicine

## 2012-03-26 VITALS — BP 122/80 | HR 72 | Temp 98.0°F | Wt 137.0 lb

## 2012-03-26 DIAGNOSIS — F341 Dysthymic disorder: Secondary | ICD-10-CM

## 2012-03-26 DIAGNOSIS — E039 Hypothyroidism, unspecified: Secondary | ICD-10-CM

## 2012-03-26 DIAGNOSIS — M542 Cervicalgia: Secondary | ICD-10-CM

## 2012-03-26 DIAGNOSIS — F419 Anxiety disorder, unspecified: Secondary | ICD-10-CM

## 2012-03-26 LAB — TSH: TSH: 0.64 u[IU]/mL (ref 0.35–5.50)

## 2012-03-26 MED ORDER — ESCITALOPRAM OXALATE 20 MG PO TABS: 30.0000 mg | ORAL_TABLET | Freq: Every day | ORAL | Status: DC

## 2012-03-26 MED ORDER — CYCLOBENZAPRINE HCL 5 MG PO TABS: 5.0000 mg | ORAL_TABLET | Freq: Two times a day (BID) | ORAL | Status: DC | PRN

## 2012-03-26 NOTE — Assessment & Plan Note (Addendum)
Symptoms not well-controlled, continue with a number of challenges in her life. Her best option to control her symptoms are: - increase escitalopram from 20 mg to 30 mg - see a counselor, list provided Also complaining of "decreased memory", no obvious deficits, she already has plans to see a neurologist Dr. Craige Cotta

## 2012-03-26 NOTE — Progress Notes (Signed)
  Subjective:    Patient ID: Jamie Patrick, female    DOB: [birth year], 56 y.o.   MRN: [id number]  HPI For is visit Anxiety, continue with a number of challenges regards her husband's health. Currently on Lexapro 20 mg. Still feels anxious most days.  Past Medical History  Diagnosis Date  . Anxiety and depression   . Hypothyroidism   . Allergic rhinitis   . CTS (carpal tunnel syndrome)     question of  . Vitamin D deficiency   . Osteopenia     as reported by pt, DEXA @ gyn  . Colon polyps     BENIGN  . Dysplasia of cervix, low grade (CIN 1)     h/o CIN-1/CIN2    Past Surgical History  Procedure Date  . Cholecystectomy 2010  . Tonsillectomy   . Uterine polyps 2003    Dr.Fernadez/ RESECTOSCOPIC POLYPECTOMY  . Cryotherapy     FOR CERVICAL DYSPLASIA CIN-1/CIN-2  . Colonoscopy     repeat in 5 years    Review of Systems Sleeps well with alprazolam Physically she feels well, still has occasional mucous accumulation in the most mornings but denies heartburn per se, no postnasal dripping, no itchy eyes or itchy nose. Still has occasional neck pain, Flexeril helps significantly. Reports decreased memory "very forgetful"  However she did not have problems remembering recent events in her life, her husband health issues etc.    Objective:   Physical Exam General -- alert, well-developed, and well-nourished.   Lungs -- normal respiratory effort, no intercostal retractions, no accessory muscle use, and normal breath sounds.   Heart-- normal rate, regular rhythm, no murmur, and no gallop.   Neurologic-- alert & oriented X3 and strength normal in all extremities. Psych-- Cognition and judgment appear intact. Alert and cooperative with normal attention span and concentration.  not anxious or  depressed appearing today       Assessment & Plan:

## 2012-03-26 NOTE — Assessment & Plan Note (Signed)
Due for TSH

## 2012-03-26 NOTE — Assessment & Plan Note (Signed)
Flexeril  helps a great deal, uses as needed only, RF provide

## 2012-03-28 ENCOUNTER — Encounter: Payer: Self-pay | Admitting: Internal Medicine

## 2012-04-22 ENCOUNTER — Ambulatory Visit (INDEPENDENT_AMBULATORY_CARE_PROVIDER_SITE_OTHER): Payer: BC Managed Care – PPO | Admitting: Licensed Clinical Social Worker

## 2012-04-22 DIAGNOSIS — F331 Major depressive disorder, recurrent, moderate: Secondary | ICD-10-CM

## 2012-04-26 ENCOUNTER — Telehealth: Payer: Self-pay | Admitting: *Deleted

## 2012-04-26 NOTE — Telephone Encounter (Signed)
Will need appointment to discuss treatment options.

## 2012-04-26 NOTE — Telephone Encounter (Signed)
(  Pt aware you are out of the office) pt saw her neurologist Dr.Kirby because patient has short term memory issues. Dr.Kirby thought that patient should start back on HRT, she thought that this may help patient with memory issues. Pt asked what you thought about this and if okay to start back on HRT? Please advise

## 2012-04-27 NOTE — Telephone Encounter (Signed)
Pt voice mail is not set up 

## 2012-04-30 ENCOUNTER — Encounter: Payer: Self-pay | Admitting: Internal Medicine

## 2012-04-30 NOTE — Telephone Encounter (Signed)
Tried to call pt twice regarding the below and pt voicemail is not set-up to leave message.

## 2012-05-04 NOTE — Telephone Encounter (Signed)
Pt informed with the below note, transferred to appointment desk 

## 2012-05-11 ENCOUNTER — Encounter: Payer: Self-pay | Admitting: Internal Medicine

## 2012-05-12 ENCOUNTER — Other Ambulatory Visit: Payer: Self-pay | Admitting: Internal Medicine

## 2012-05-12 NOTE — Telephone Encounter (Signed)
Done

## 2012-05-12 NOTE — Telephone Encounter (Signed)
Ok to refill? Last OV 12.20.13 Last filled 9.10.13

## 2012-05-16 ENCOUNTER — Ambulatory Visit (INDEPENDENT_AMBULATORY_CARE_PROVIDER_SITE_OTHER): Payer: BC Managed Care – PPO | Admitting: Family Medicine

## 2012-05-16 VITALS — BP 116/78 | HR 71 | Temp 97.9°F | Resp 16 | Ht <= 58 in | Wt 138.6 lb

## 2012-05-16 DIAGNOSIS — H1130 Conjunctival hemorrhage, unspecified eye: Secondary | ICD-10-CM

## 2012-05-16 DIAGNOSIS — S0500XA Injury of conjunctiva and corneal abrasion without foreign body, unspecified eye, initial encounter: Secondary | ICD-10-CM

## 2012-05-16 DIAGNOSIS — F411 Generalized anxiety disorder: Secondary | ICD-10-CM

## 2012-05-16 DIAGNOSIS — H1131 Conjunctival hemorrhage, right eye: Secondary | ICD-10-CM

## 2012-05-16 DIAGNOSIS — S0501XA Injury of conjunctiva and corneal abrasion without foreign body, right eye, initial encounter: Secondary | ICD-10-CM

## 2012-05-16 MED ORDER — CIPROFLOXACIN HCL 0.3 % OP SOLN: OPHTHALMIC | Status: DC

## 2012-05-16 NOTE — Progress Notes (Signed)
Subjective:    Patient ID: Jamie Jamie Patrick, female    DOB: [birth year], 57 y.o.   MRN: [id number]  HPI Jamie Jamie Patrick is a 57 y.o. female Here for multiple concerns today.   R eye redness: noticed yesterday when she woke up.  No pain., no discomfort.  No CL's.  Wears glasses usually - did not bring today.  No fever. No recent cough, no sneeze. NKI.Marland Kitchen No foreign body. No recent straining, cough or vomiting. Was crying the night before.   Anxiety - treated by Dr. Drue Patrick. Increased Lexapro to 30mg  at OV in December.  Also gave list of counselors. Only had one visit with a counselor. Jamie Jamie Patrick.  Has not scheduled follow up yet.  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 Worried about job.  Followed by neurologist for this.  Planning on scheduling more tests.   Headache - 2 days ago - pressure on top of head.  Improved - none today.  Increased stress with above.    Review of Systems  Eyes: Positive for redness. Negative for photophobia, pain and visual disturbance.  Respiratory: Negative for shortness of breath.   Psychiatric/Behavioral: The patient is nervous/anxious.        Objective:   Physical Exam  Vitals reviewed. Constitutional: She is oriented to person, place, and time. She appears well-developed and well-nourished. No distress.  HENT:  Head: Normocephalic and atraumatic.  Jamie [name]: Hearing, tympanic membrane, external ear and ear canal normal.  Left Ear: Hearing, tympanic membrane, external ear and ear canal normal.  Nose: Nose normal.  Mouth/Throat: Oropharynx is clear and moist. No oropharyngeal exudate.  Eyes: EOM and lids are normal. Pupils are equal, round, and reactive to light. No foreign bodies found. Jamie [name] exhibits no discharge, no exudate and no hordeolum. No foreign body present in the Jamie [name]. Jamie [name] has a hemorrhage.    Anterior chamber clear,  Proparacaine 2 gtts applied Lids everted, no foreign body  identified. Fluorescein applied, no uptake.  Flushed with sterile water, no complications.   Cardiovascular: Normal rate, regular rhythm, normal heart sounds and intact distal pulses.   No murmur heard. Pulmonary/Chest: Effort normal and breath sounds normal. No respiratory distress. She has no wheezes. She has no rhonchi.  Neurological: She is alert and oriented to person, place, and time.  Skin: Skin is warm and dry. No rash noted.  Psychiatric: Her speech is normal and behavior is normal. Her mood appears anxious.      Assessment & Plan:  Jamie Jamie Patrick is a 57 y.o. female 1. Subconjunctival hemorrhage, Jamie Patrick  ciprofloxacin (CILOXAN) 0.3 % ophthalmic solution   ciprofloxacin (CILOXAN) 0.3 % ophthalmic solution  2. Conjunctival abrasion, Jamie Patrick, initial encounter  ciprofloxacin (CILOXAN) 0.3 % ophthalmic solution   ciprofloxacin (CILOXAN) 0.3 % ophthalmic solution.  Likely had abrasion with rubbing eyes when crying 2 nights ago - anterior chamber ok. Will treat with ciloxan given abrasion, but cornea looks ok. Recheck in 48 hours. Rtc/er precautions given.   3. Anxiety state, unspecified   followed by primary provider. reports being under a lot of stress lately with memory and work concerns.  Instructed to follow up with neuro and primary provider, but also encouraged to call counselor to restart counseling especially with recent stressors.   Patient Instructions  Start the eye drops today, recheck in 2 days. Return to the clinic or go to the nearest emergency room if any of your symptoms  worsen or new symptoms occur, including but not limited to blurry vision or pain in eye.  Subconjunctival Hemorrhage A subconjunctival hemorrhage is a bright red patch covering a portion of the white of the eye. The white part of the eye is called the sclera, and it is covered by a thin membrane called the conjunctiva. This membrane is clear, except for tiny blood vessels that you can see with the naked eye.  When your eye is irritated or inflamed and becomes red, it is because the vessels in the conjunctiva are swollen. Sometimes, a blood vessel in the conjunctiva can break and bleed. When this occurs, the blood builds up between the conjunctiva and the sclera, and spreads out to create a red area. The red spot may be very small at first. It may then spread to cover a larger part of the surface of the eye, or even all of the visible white part of the eye. In almost all cases, the blood will go away and the eye will become white again. Before completely dissolving, however, the red area may spread. It may also become brownish-yellow in color, before going away. If a lot of blood collects under the conjunctiva, it may look like a bulge on the surface of the eye. This looks scary, but it will also eventually flatten out and go away. Subconjunctival hemorrhages do not cause pain, but if swollen, may cause a feeling of irritation. There is no effect on vision.  CAUSES   The most common cause is mild trauma (rubbing the eye, irritation).  Subconjunctival hemorrhages can happen because of coughing or straining (lifting heavy objects), vomiting, or sneezing.  In some cases, your doctor may want to check your blood pressure. High blood pressure can also cause a sunconjunctival hemorrhage.  Severe trauma or blunt injuries.  Diseases that affect blood clotting (hemophilia, leukemia).  Abnormalities of blood vessels behind the eye (carotid cavernous sinus fistula).  Tumors behind the eye.  Certain drugs (aspirin, coumadin, heparin).  Recent eye surgery. HOME CARE INSTRUCTIONS   Do not worry about the appearance of your eye. You may continue your usual activities.  Often, follow-up is not necessary. SEEK MEDICAL CARE IF:   Your eye becomes painful.  The bleeding does not disappear within 3 weeks.  Bleeding occurs elsewhere, for example, under the skin, in the mouth, or in the other eye.  You have  recurring subconjunctival hemorrhages. SEEK IMMEDIATE MEDICAL CARE IF:   Your vision changes or you have difficulty seeing.  You develop severe headache, persistent vomiting, confusion, or abnormal drowsiness (lethargy).  Your eye seems to bulge or protrude from the eye socket.  You notice the sudden appearance of bruises, or have spontaneous bleeding elsewhere on your body. Document Released: 03/24/2005 Document Revised: 06/16/2011 Document Reviewed: 02/19/2009 [facility] Patient Information 2013 [place], [place].

## 2012-05-16 NOTE — Patient Instructions (Signed)
Start the eye drops today, recheck in 2 days. Return to the clinic or go to the nearest emergency room if any of your symptoms worsen or new symptoms occur, including but not limited to blurry vision or pain in eye.  Subconjunctival Hemorrhage A subconjunctival hemorrhage is a bright red patch covering a portion of the white of the eye. The white part of the eye is called the sclera, and it is covered by a thin membrane called the conjunctiva. This membrane is clear, except for tiny blood vessels that you can see with the naked eye. When your eye is irritated or inflamed and becomes red, it is because the vessels in the conjunctiva are swollen. Sometimes, a blood vessel in the conjunctiva can break and bleed. When this occurs, the blood builds up between the conjunctiva and the sclera, and spreads out to create a red area. The red spot may be very small at first. It may then spread to cover a larger part of the surface of the eye, or even all of the visible white part of the eye. In almost all cases, the blood will go away and the eye will become white again. Before completely dissolving, however, the red area may spread. It may also become brownish-yellow in color, before going away. If a lot of blood collects under the conjunctiva, it may look like a bulge on the surface of the eye. This looks scary, but it will also eventually flatten out and go away. Subconjunctival hemorrhages do not cause pain, but if swollen, may cause a feeling of irritation. There is no effect on vision.  CAUSES   The most common cause is mild trauma (rubbing the eye, irritation).  Subconjunctival hemorrhages can happen because of coughing or straining (lifting heavy objects), vomiting, or sneezing.  In some cases, your doctor may want to check your blood pressure. High blood pressure can also cause a sunconjunctival hemorrhage.  Severe trauma or blunt injuries.  Diseases that affect blood clotting (hemophilia,  leukemia).  Abnormalities of blood vessels behind the eye (carotid cavernous sinus fistula).  Tumors behind the eye.  Certain drugs (aspirin, coumadin, heparin).  Recent eye surgery. HOME CARE INSTRUCTIONS   Do not worry about the appearance of your eye. You may continue your usual activities.  Often, follow-up is not necessary. SEEK MEDICAL CARE IF:   Your eye becomes painful.  The bleeding does not disappear within 3 weeks.  Bleeding occurs elsewhere, for example, under the skin, in the mouth, or in the other eye.  You have recurring subconjunctival hemorrhages. SEEK IMMEDIATE MEDICAL CARE IF:   Your vision changes or you have difficulty seeing.  You develop severe headache, persistent vomiting, confusion, or abnormal drowsiness (lethargy).  Your eye seems to bulge or protrude from the eye socket.  You notice the sudden appearance of bruises, or have spontaneous bleeding elsewhere on your body. Document Released: 03/24/2005 Document Revised: 06/16/2011 Document Reviewed: 02/19/2009 [facility] Patient Information 2013 [place], [place].

## 2012-05-17 ENCOUNTER — Telehealth: Payer: Self-pay | Admitting: Internal Medicine

## 2012-05-17 NOTE — Telephone Encounter (Signed)
Please advise 

## 2012-05-17 NOTE — Telephone Encounter (Signed)
per hand wrt note from pharmacy -- [facility] has changed levothyroxine from Mylan Brand to the Sand brand (NCD# [id number]) ( ) is that ok with the dr for this patient

## 2012-05-17 NOTE — Telephone Encounter (Signed)
As long as she is taking the same micrograms ,I'm ok with that.

## 2012-05-18 ENCOUNTER — Ambulatory Visit (INDEPENDENT_AMBULATORY_CARE_PROVIDER_SITE_OTHER): Payer: BC Managed Care – PPO | Admitting: Family Medicine

## 2012-05-18 VITALS — BP 124/76 | HR 78 | Temp 97.8°F | Resp 17 | Ht <= 58 in | Wt 138.0 lb

## 2012-05-18 DIAGNOSIS — H1130 Conjunctival hemorrhage, unspecified eye: Secondary | ICD-10-CM

## 2012-05-18 DIAGNOSIS — H1131 Conjunctival hemorrhage, right eye: Secondary | ICD-10-CM

## 2012-05-18 NOTE — Progress Notes (Signed)
Subjective:    Patient ID: Jamie Patrick, female    DOB: 03/10/1956, 57 y.o.   MRN: [id number]  HPI Jamie Patrick is a 57 y.o. female  Seen 2 days ago - R conjunctival abrasion and subconjunctival hemorrhage. Started on Ciloxan.   More red around the right part of eye.  Not hurting, but feels a sensation inside eye, slight irritation. No change in vision.   No hx of coagulopathy. Not on ASA.  Here by self today - interviewed with assistant in room - denies home safety concerns.    Review of Systems  Constitutional: Negative for fever and chills.  Eyes: Negative for discharge, itching and visual disturbance.  Hematological: Does not bruise/bleed easily.       Objective:   Physical Exam  Nursing note and vitals reviewed. Constitutional: She is oriented to person, place, and time. She appears well-developed and well-nourished.  HENT:  Head: Normocephalic and atraumatic.  Eyes: EOM and lids are normal. Pupils are equal, round, and reactive to light. Right eye exhibits no discharge. Right conjunctiva has a hemorrhage.  Fundoscopic exam:      The right eye shows no AV nicking, no exudate, no hemorrhage and no papilledema.    Subconjunctival hemorrhage - extending from temporal to inferior sclera, and middle to inferior right nasal region.  No hyphema, ant chamber clear.  Pulmonary/Chest: Effort normal.  Neurological: She is alert and oriented to person, place, and time.  Skin: Skin is warm and dry.  Psychiatric: She has a normal mood and affect. Her behavior is normal.  vision reviewed.      Assessment & Plan:  Jamie Patrick is a 57 y.o. female 1. Subconjunctival hemorrhage, right   2nd MD exam. Suspect migration of hematoma to right, but no hx of coagulopathy and remainder of eye exam wnl.  Can continue ciloxan for initial conjunctival abrasion, rtc precautions given.  Discussed anticipated color changes and expected timing of resolution. Understanding expressed.   Patient  Instructions  You can continue the antibiotic, but  the red area appears to all be a subconjunctival hemorrhage as we discussed before. Return to the clinic or go to the nearest emergency room if any of your symptoms worsen or new symptoms occur. Subconjunctival Hemorrhage A subconjunctival hemorrhage is a bright red patch covering a portion of the white of the eye. The white part of the eye is called the sclera, and it is covered by a thin membrane called the conjunctiva. This membrane is clear, except for tiny blood vessels that you can see with the naked eye. When your eye is irritated or inflamed and becomes red, it is because the vessels in the conjunctiva are swollen. Sometimes, a blood vessel in the conjunctiva can break and bleed. When this occurs, the blood builds up between the conjunctiva and the sclera, and spreads out to create a red area. The red spot may be very small at first. It may then spread to cover a larger part of the surface of the eye, or even all of the visible white part of the eye. In almost all cases, the blood will go away and the eye will become white again. Before completely dissolving, however, the red area may spread. It may also become brownish-yellow in color, before going away. If a lot of blood collects under the conjunctiva, it may look like a bulge on the surface of the eye. This looks scary, but it will also eventually flatten out and go away.  Subconjunctival hemorrhages do not cause pain, but if swollen, may cause a feeling of irritation. There is no effect on vision.  CAUSES   The most common cause is mild trauma (rubbing the eye, irritation).  Subconjunctival hemorrhages can happen because of coughing or straining (lifting heavy objects), vomiting, or sneezing.  In some cases, your doctor may want to check your blood pressure. High blood pressure can also cause a sunconjunctival hemorrhage.  Severe trauma or blunt injuries.  Diseases that affect blood  clotting (hemophilia, leukemia).  Abnormalities of blood vessels behind the eye (carotid cavernous sinus fistula).  Tumors behind the eye.  Certain drugs (aspirin, coumadin, heparin).  Recent eye surgery. HOME CARE INSTRUCTIONS   Do not worry about the appearance of your eye. You may continue your usual activities.  Often, follow-up is not necessary. SEEK MEDICAL CARE IF:   Your eye becomes painful.  The bleeding does not disappear within 3 weeks.  Bleeding occurs elsewhere, for example, under the skin, in the mouth, or in the other eye.  You have recurring subconjunctival hemorrhages. SEEK IMMEDIATE MEDICAL CARE IF:   Your vision changes or you have difficulty seeing.  You develop severe headache, persistent vomiting, confusion, or abnormal drowsiness (lethargy).  Your eye seems to bulge or protrude from the eye socket.  You notice the sudden appearance of bruises, or have spontaneous bleeding elsewhere on your body. Document Released: 03/24/2005 Document Revised: 06/16/2011 Document Reviewed: 02/19/2009 [facility] Patient Information 2013 [place], [place].

## 2012-05-18 NOTE — Patient Instructions (Addendum)
You can continue the antibiotic, but  the red area appears to all be a subconjunctival hemorrhage as we discussed before. Return to the clinic or go to the nearest emergency room if any of your symptoms worsen or new symptoms occur. Subconjunctival Hemorrhage A subconjunctival hemorrhage is a bright red patch covering a portion of the white of the eye. The white part of the eye is called the sclera, and it is covered by a thin membrane called the conjunctiva. This membrane is clear, except for tiny blood vessels that you can see with the naked eye. When your eye is irritated or inflamed and becomes red, it is because the vessels in the conjunctiva are swollen. Sometimes, a blood vessel in the conjunctiva can break and bleed. When this occurs, the blood builds up between the conjunctiva and the sclera, and spreads out to create a red area. The red spot may be very small at first. It may then spread to cover a larger part of the surface of the eye, or even all of the visible white part of the eye. In almost all cases, the blood will go away and the eye will become white again. Before completely dissolving, however, the red area may spread. It may also become brownish-yellow in color, before going away. If a lot of blood collects under the conjunctiva, it may look like a bulge on the surface of the eye. This looks scary, but it will also eventually flatten out and go away. Subconjunctival hemorrhages do not cause pain, but if swollen, may cause a feeling of irritation. There is no effect on vision.  CAUSES   The most common cause is mild trauma (rubbing the eye, irritation).  Subconjunctival hemorrhages can happen because of coughing or straining (lifting heavy objects), vomiting, or sneezing.  In some cases, your doctor may want to check your blood pressure. High blood pressure can also cause a sunconjunctival hemorrhage.  Severe trauma or blunt injuries.  Diseases that affect blood clotting  (hemophilia, leukemia).  Abnormalities of blood vessels behind the eye (carotid cavernous sinus fistula).  Tumors behind the eye.  Certain drugs (aspirin, coumadin, heparin).  Recent eye surgery. HOME CARE INSTRUCTIONS   Do not worry about the appearance of your eye. You may continue your usual activities.  Often, follow-up is not necessary. SEEK MEDICAL CARE IF:   Your eye becomes painful.  The bleeding does not disappear within 3 weeks.  Bleeding occurs elsewhere, for example, under the skin, in the mouth, or in the other eye.  You have recurring subconjunctival hemorrhages. SEEK IMMEDIATE MEDICAL CARE IF:   Your vision changes or you have difficulty seeing.  You develop severe headache, persistent vomiting, confusion, or abnormal drowsiness (lethargy).  Your eye seems to bulge or protrude from the eye socket.  You notice the sudden appearance of bruises, or have spontaneous bleeding elsewhere on your body. Document Released: 03/24/2005 Document Revised: 06/16/2011 Document Reviewed: 02/19/2009 [facility] Patient Information 2013 [place], [place].

## 2012-05-18 NOTE — Telephone Encounter (Signed)
Discussed with pharmacy & it is the same micrograms.

## 2012-05-20 ENCOUNTER — Ambulatory Visit: Payer: BC Managed Care – PPO | Admitting: Licensed Clinical Social Worker

## 2012-05-22 ENCOUNTER — Other Ambulatory Visit: Payer: Self-pay

## 2012-06-07 ENCOUNTER — Ambulatory Visit (INDEPENDENT_AMBULATORY_CARE_PROVIDER_SITE_OTHER): Payer: BC Managed Care – PPO | Admitting: Gynecology

## 2012-06-07 ENCOUNTER — Encounter: Payer: Self-pay | Admitting: Gynecology

## 2012-06-07 VITALS — BP 118/70

## 2012-06-07 DIAGNOSIS — N951 Menopausal and female climacteric states: Secondary | ICD-10-CM

## 2012-06-07 DIAGNOSIS — M8580 Other specified disorders of bone density and structure, unspecified site: Secondary | ICD-10-CM

## 2012-06-07 DIAGNOSIS — M899 Disorder of bone, unspecified: Secondary | ICD-10-CM

## 2012-06-07 MED ORDER — MEDROXYPROGESTERONE ACETATE 10 MG PO TABS: ORAL_TABLET | ORAL | Status: DC

## 2012-06-07 MED ORDER — ESTRADIOL 1 MG PO TABS: 1.0000 mg | ORAL_TABLET | Freq: Every day | ORAL | Status: DC

## 2012-06-07 NOTE — Patient Instructions (Addendum)
Hormone Therapy At menopause, your body begins making less estrogen and progesterone hormones. This causes the body to stop having menstrual periods. This is because estrogen and progesterone hormones control your periods and menstrual cycle. A lack of estrogen may cause symptoms such as:  Hot flushes (or hot flashes).  Vaginal dryness.  Dry skin.  Loss of sex drive.  Risk of bone loss (osteoporosis). When this happens, you may choose to take hormone therapy to get back the estrogen lost during menopause. When the hormone estrogen is given alone, it is usually referred to as ET (Estrogen Therapy). When the hormone progestin is combined with estrogen, it is generally called HT (Hormone Therapy). This was formerly known as hormone replacement therapy (HRT). Your caregiver can help you make a decision on what will be best for you. The decision to use HT seems to change often as new studies are done. Many studies do not agree on the benefits of hormone replacement therapy. LIKELY BENEFITS OF HT INCLUDE PROTECTION FROM:  Hot Flushes (also called hot flashes) - A hot flush is a sudden feeling of heat that spreads over the face and body. The skin may redden like a blush. It is connected with sweats and sleep disturbance. Women going through menopause may have hot flushes a few times a month or several times per day depending on the woman.  Osteoporosis (bone loss)- Estrogen helps guard against bone loss. After menopause, a woman's bones slowly lose calcium and become weak and brittle. As a result, bones are more likely to break. The hip, wrist, and spine are affected most often. Hormone therapy can help slow bone loss after menopause. Weight bearing exercise and taking calcium with vitamin D also can help prevent bone loss. There are also medications that your caregiver can prescribe that can help prevent osteoporosis.  Vaginal Dryness - Loss of estrogen causes changes in the vagina. Its lining may  become thin and dry. These changes can cause pain and bleeding during sexual intercourse. Dryness can also lead to infections. This can cause burning and itching. (Vaginal estrogen treatment can help relieve pain, itching, and dryness.)  Urinary Tract Infections are more common after menopause because of lack of estrogen. Some women also develop urinary incontinence because of low estrogen levels in the vagina and bladder.  Possible other benefits of estrogen include a positive effect on mood and short-term memory in women. RISKS AND COMPLICATIONS  Using estrogen alone without progesterone causes the lining of the uterus to grow. This increases the risk of lining of the uterus (endometrial) cancer. Your caregiver should give another hormone called progestin if you have a uterus.  Women who take combined (estrogen and progestin) HT appear to have an increased risk of breast cancer. The risk appears to be small, but increases throughout the time that HT is taken.  Combined therapy also makes the breast tissue slightly denser which makes it harder to read mammograms (breast X-rays).  Combined, estrogen and progesterone therapy can be taken together every day, in which case there may be spotting of blood. HT therapy can be taken cyclically in which case you will have menstrual periods. Cyclically means HT is taken for a set amount of days, then not taken, then this process is repeated.  HT may increase the risk of stroke, heart attack, breast cancer and forming blood clots in your leg.  Transdermal estrogen (estrogen that is absorbed through the skin with a patch or a cream) may have more positive results with:    Cholesterol.  Blood pressure.  Blood clots. Having the following conditions may indicate you should not have HT:  Endometrial cancer.  Liver disease.  Breast cancer.  Heart disease.  History of blood clots.  Stroke. TREATMENT   If you choose to take HT and have a uterus,  usually estrogen and progestin are prescribed.  Your caregiver will help you decide the best way to take the medications.  Possible ways to take estrogen include:  Pills.  Patches.  Gels.  Sprays.  Vaginal estrogen cream, rings and tablets.  It is best to take the lowest dose possible that will help your symptoms and take them for the shortest period of time that you can.  Hormone therapy can help relieve some of the problems (symptoms) that affect women at menopause. Before making a decision about HT, talk to your caregiver about what is best for you. Be well informed and comfortable with your decisions. HOME CARE INSTRUCTIONS   Follow your caregivers advice when taking the medications.  A Pap test is done to screen for cervical cancer.  The first Pap test should be done at age 34.  Between ages 45 and 36, Pap tests are repeated every 2 years.  Beginning at age 57, you are advised to have a Pap test every 3 years as long as your past 3 Pap tests have been normal.  Some women have medical problems that increase the chance of getting cervical cancer. Talk to your caregiver about these problems. It is especially important to talk to your caregiver if a new problem develops soon after your last Pap test. In these cases, your caregiver may recommend more frequent screening and Pap tests.  The above recommendations are the same for women who have or have not gotten the vaccine for HPV (Human Papillomavirus).  If you had a hysterectomy for a problem that was not a cancer or a condition that could lead to cancer, then you no longer need Pap tests. However, even if you no longer need a Pap test, a regular exam is a good idea to make sure no other problems are starting.   If you are between ages 88 and 74, and you have had normal Pap tests going back 10 years, you no longer need Pap tests. However, even if you no longer need a Pap test, a regular exam is a good idea to make sure no  other problems are starting.   If you have had past treatment for cervical cancer or a condition that could lead to cancer, you need Pap tests and screening for cancer for at least 20 years after your treatment.  If Pap tests have been discontinued, risk factors (such as a new sexual partner) need to be re-assessed to determine if screening should be resumed.  Some women may need screenings more often if they are at high risk for cervical cancer.  Get mammograms done as per the advice of your caregiver. SEEK IMMEDIATE MEDICAL CARE IF:  You develop abnormal vaginal bleeding.  You have pain or swelling in your legs, shortness of breath, or chest pain.  You develop dizziness or headaches.  You have lumps or changes in your breasts or armpits.  You have slurred speech.  You develop weakness or numbness of your arms or legs.  You have pain, burning, or bleeding when urinating.  You develop abdominal pain. Document Released: 12/21/2002 Document Revised: 06/16/2011 Document Reviewed: 04/10/2010 [facility] Patient Information 2013 [address], [place].  Bone Densitometry Bone densitometry is  a special X-ray that measures your bone density and can be used to help predict your risk of bone fractures. This test is used to determine bone mineral content and density to diagnose osteoporosis. Osteoporosis is the loss of bone that may cause the bone to become weak. Osteoporosis commonly occurs in women entering menopause. However, it may be found in men and in people with other diseases. PREPARATION FOR TEST No preparation necessary. WHO SHOULD BE TESTED?  All women older than 15.  Postmenopausal women (50 to 55) with risk factors for osteoporosis.  People with a previous fracture caused by normal activities.  People with a small body frame (less than 127 poundsor a body mass index [BMI] of less than 21).  People who have a parent with a hip fracture or history of osteoporosis.  People  who smoke.  People who have rheumatoid arthritis.  Anyone who engages in excessive alcohol use (more than 3 drinks most days).  Women who experience early menopause. WHEN SHOULD YOU BE RETESTED? Current guidelines suggest that you should wait at least 2 years before doing a bone density test again if your first test was normal.Recent studies indicated that women with normal bone density may be able to wait a few years before needing to repeat a bone density test. You should discuss this with your caregiver.  NORMAL FINDINGS   Normal: less than standard deviation below normal (greater than -1).  Osteopenia: 1 to 2.5 standard deviations below normal (-1 to -2.5).  Osteoporosis: greater than 2.5 standard deviations below normal (less than -2.5). Test results are reported as a "T score" and a "Z score."The T score is a number that compares your bone density with the bone density of healthy, young women.The Z score is a number that compares your bone density with the scores of women who are the same age, gender, and race.  Ranges for normal findings may vary among different laboratories and hospitals. You should always check with your doctor after having lab work or other tests done to discuss the meaning of your test results and whether your values are considered within normal limits. MEANING OF TEST  Your caregiver will go over the test results with you and discuss the importance and meaning of your results, as well as treatment options and the need for additional tests if necessary. OBTAINING THE TEST RESULTS It is your responsibility to obtain your test results. Ask the lab or department performing the test when and how you will get your results. Document Released: 04/15/2004 Document Revised: 06/16/2011 Document Reviewed: 05/08/2010 [facility] Patient Information 2013 [place], [place].

## 2012-06-07 NOTE — Progress Notes (Signed)
Patient presented with her husband today to discuss once again hormone replacement therapy. Patient been complaining of hot flashes insomnia and vaginal dryness. Review of her records indicated that she was placed on elestrin 0.06% transdermal one arm daily with the addition of Provera 10 mg for 12 days of the month back in 2012  did not take it for very long. For this reason we had placed her than on Estrace 1 mg by mouth daily with the addition of Provera for 10 days to 12 days of the month. She was again discontinued on her own. She's having emotional issues such as depression anxiety and is seeing her therapist Ms. Berniece Andreas  For which she is taking Lexapro 20 mg daily.  She stated that her physician recently had ordered an MRI because of memory loss and I do not have that report. Her primary physician also has been treating her for hypothyroidism and her last thyroid function tests were normal. Her last vitamin D level was normal in 2013. She is scheduled for bone density study tomorrow she has had history of osteopenia in the past. She states that she is taking her calcium and vitamin D daily.  Assessment/plan: Patient with menopausal symptoms. We discussed the risks benefits and pros and cons of hormone replacement therapy as well as the women's health initiative study. Patient fully understands importance of monthly self breast examination and annual breast exams and mammogram. She also understands importance of compliance to be able to get good response to the medication she needs to stay on it. We discussed about keeping her on it for 5-6 years and then begin to taper her down. She will be prescribed once again Estrace 1 mg by mouth daily with the addition of Provera 10 mg 1 by mouth daily for the first 10 days of the month. Literature information was provided in [place]. Her mammogram September 2013 was normal.

## 2012-06-08 ENCOUNTER — Ambulatory Visit (INDEPENDENT_AMBULATORY_CARE_PROVIDER_SITE_OTHER): Payer: BC Managed Care – PPO | Admitting: Licensed Clinical Social Worker

## 2012-06-08 ENCOUNTER — Ambulatory Visit (INDEPENDENT_AMBULATORY_CARE_PROVIDER_SITE_OTHER): Payer: BC Managed Care – PPO

## 2012-06-08 DIAGNOSIS — M949 Disorder of cartilage, unspecified: Secondary | ICD-10-CM

## 2012-06-08 DIAGNOSIS — M8580 Other specified disorders of bone density and structure, unspecified site: Secondary | ICD-10-CM

## 2012-06-08 DIAGNOSIS — F331 Major depressive disorder, recurrent, moderate: Secondary | ICD-10-CM

## 2012-06-15 ENCOUNTER — Encounter: Payer: Self-pay | Admitting: Internal Medicine

## 2012-06-18 ENCOUNTER — Other Ambulatory Visit: Payer: Self-pay | Admitting: *Deleted

## 2012-06-18 DIAGNOSIS — M8580 Other specified disorders of bone density and structure, unspecified site: Secondary | ICD-10-CM

## 2012-06-24 ENCOUNTER — Ambulatory Visit: Payer: BC Managed Care – PPO | Admitting: Licensed Clinical Social Worker

## 2012-06-28 ENCOUNTER — Other Ambulatory Visit: Payer: BC Managed Care – PPO

## 2012-06-29 ENCOUNTER — Other Ambulatory Visit: Payer: BC Managed Care – PPO

## 2012-07-01 ENCOUNTER — Other Ambulatory Visit: Payer: BC Managed Care – PPO

## 2012-07-01 DIAGNOSIS — M8580 Other specified disorders of bone density and structure, unspecified site: Secondary | ICD-10-CM

## 2012-07-02 LAB — VITAMIN D 25 HYDROXY (VIT D DEFICIENCY, FRACTURES): Vit D, 25-Hydroxy: 37 ng/mL (ref 30–89)

## 2012-07-02 LAB — PTH, INTACT AND CALCIUM: Calcium, Total (PTH): 9.9 mg/dL (ref 8.4–10.5)

## 2012-07-03 ENCOUNTER — Ambulatory Visit (INDEPENDENT_AMBULATORY_CARE_PROVIDER_SITE_OTHER): Payer: BC Managed Care – PPO | Admitting: Physician Assistant

## 2012-07-03 VITALS — BP 91/62 | HR 79 | Temp 97.6°F | Resp 18 | Ht 58.5 in | Wt 137.6 lb

## 2012-07-03 DIAGNOSIS — R509 Fever, unspecified: Secondary | ICD-10-CM

## 2012-07-03 DIAGNOSIS — H9209 Otalgia, unspecified ear: Secondary | ICD-10-CM

## 2012-07-03 DIAGNOSIS — H9203 Otalgia, bilateral: Secondary | ICD-10-CM

## 2012-07-03 DIAGNOSIS — J029 Acute pharyngitis, unspecified: Secondary | ICD-10-CM

## 2012-07-03 LAB — POCT RAPID STREP A (OFFICE): Rapid Strep A Screen: NEGATIVE

## 2012-07-03 MED ORDER — FIRST-DUKES MOUTHWASH MT SUSP: 5.0000 mL | OROMUCOSAL | Status: DC | PRN

## 2012-07-03 MED ORDER — AMOXICILLIN 875 MG PO TABS: 875.0000 mg | ORAL_TABLET | Freq: Two times a day (BID) | ORAL | Status: DC

## 2012-07-03 NOTE — Progress Notes (Signed)
  Subjective:    Patient ID: Jamie Patrick, female    DOB: 03-31-1956, 57 y.o.   MRN: [id number]  HPI 57 year old female presents with 6 day history of progressively worsening sore throat.  Admits to chills and subjective fever, but has not taken her temperature. Has had body aches.  Denies nasal congestion, cough, SOB, wheezing, nausea, vomiting, or abdominal pain.  Admits to bilateral ear fullness/pressure.  No headache or dizziness.  Has taken Nyquil and Motrin. She has history of anxiety/depressision associated with husbands 4 year battle with leukemia - he is in remission now and doing well.  She works at a [employer]. No other concerns today.  History of strep infections when she was a child. Had tonsillectomy when she was 9 years old.     Review of Systems  Constitutional: Positive for fever (subjective) and chills.  HENT: Positive for ear pain and sore throat. Negative for congestion, rhinorrhea, neck stiffness, postnasal drip and sinus pressure.   Respiratory: Negative for cough, shortness of breath and wheezing.   Gastrointestinal: Negative for nausea, vomiting and abdominal pain.  Neurological: Negative for dizziness and headaches.       Objective:   Physical Exam  Constitutional: She is oriented to person, place, and time. She appears well-developed and well-nourished.  HENT:  Head: Normocephalic and atraumatic.  Right Ear: Hearing, tympanic membrane, external ear and ear canal normal.  Left Ear: Hearing, tympanic membrane, external ear and ear canal normal.  Mouth/Throat: Uvula is midline. Oropharyngeal exudate and posterior oropharyngeal erythema present. No posterior oropharyngeal edema or tonsillar abscesses.  Eyes: Conjunctivae are normal.  Neck: Normal range of motion. Neck supple.  Cardiovascular: Normal rate, regular rhythm and normal heart sounds.   Pulmonary/Chest: Effort normal and breath sounds normal.  Lymphadenopathy:    She has no cervical adenopathy.   Neurological: She is alert and oriented to person, place, and time.  Psychiatric: She has a normal mood and affect. Her behavior is normal. Judgment and thought content normal.     Results for orders placed in visit on 07/03/12  POCT RAPID STREP A (OFFICE)      Result Value Range   Rapid Strep A Screen Negative  Negative        Assessment & Plan:  Acute pharyngitis - Plan: POCT rapid strep A, amoxicillin (AMOXIL) 875 MG tablet, Diphenhyd-Hydrocort-Nystatin (FIRST-DUKES MOUTHWASH) SUSP, Culture, Group A Strep  Fever, unspecified  Otalgia, bilateral  Despite negative rapid strep, will go ahead and treat for strep with amoxicillin 875 mg bid x 10 days Throat culture sent Increase fluids and rest Continue tylenol or motrin as needed for pain/chills Duke's mouthwash prn Follow up if symptoms worsen or fail to improve.

## 2012-07-05 ENCOUNTER — Encounter: Payer: Self-pay | Admitting: Internal Medicine

## 2012-07-05 LAB — CULTURE, GROUP A STREP

## 2012-07-09 ENCOUNTER — Other Ambulatory Visit: Payer: Self-pay | Admitting: Physician Assistant

## 2012-07-09 MED ORDER — FLUTICASONE PROPIONATE 50 MCG/ACT NA SUSP: 2.0000 | Freq: Every day | NASAL | Status: DC

## 2012-07-23 ENCOUNTER — Ambulatory Visit: Payer: BC Managed Care – PPO | Admitting: Internal Medicine

## 2012-07-30 ENCOUNTER — Telehealth: Payer: Self-pay

## 2012-07-30 NOTE — Telephone Encounter (Signed)
Patient called c/o vaginal itching. No discharge, no odor.  She cannot come for office visit today and would like recommendation for something OTC she could try.  I explained to patient that without wet prep no way to know for sure what is causing the itching. I told her that vaginal yeast infection is often accompanied by itching.  I recommended Monistat of Gyne-Lotrimin for possible yeast infection and explained to patient that if symptoms persists she will need office visit to assess.

## 2012-08-09 ENCOUNTER — Ambulatory Visit: Payer: BC Managed Care – PPO | Admitting: Women's Health

## 2012-08-09 ENCOUNTER — Telehealth: Payer: Self-pay | Admitting: Internal Medicine

## 2012-08-09 ENCOUNTER — Ambulatory Visit (INDEPENDENT_AMBULATORY_CARE_PROVIDER_SITE_OTHER): Payer: BC Managed Care – PPO | Admitting: Licensed Clinical Social Worker

## 2012-08-09 DIAGNOSIS — F331 Major depressive disorder, recurrent, moderate: Secondary | ICD-10-CM

## 2012-08-09 NOTE — Telephone Encounter (Signed)
Patient has questions regarding a recent change in her medications. It is regarding her anxiety medication and it is not working. She would like to lorazepam if Dr. Drue Novel thinks this will help her. Pt uses [facility] on [address].

## 2012-08-09 NOTE — Telephone Encounter (Signed)
Schedule a visit

## 2012-08-09 NOTE — Telephone Encounter (Signed)
Spoke with pt & she states that the lexapro is no longer helping her with her anxiety. Pt states that she would like to switch to lorazepam. Please advise.

## 2012-08-09 NOTE — Telephone Encounter (Signed)
Spoke to pt, she states that it is very difficult for her to take off work to come in for an appt. Pt states that she saw Berniece Andreas today & discussed this with her. Pt stated that Raynelle Fanning told her that she would discuss with Dr. Drue Novel about changing her meds. Please advise.

## 2012-08-10 NOTE — Telephone Encounter (Signed)
lmovm for pt to return call.  

## 2012-08-10 NOTE — Telephone Encounter (Signed)
I really can not make such changes w/o OV

## 2012-08-12 NOTE — Telephone Encounter (Signed)
lmovm for pt to return call & schedule OV. 

## 2012-08-12 NOTE — Telephone Encounter (Signed)
This will be handled at OV.

## 2012-08-12 NOTE — Telephone Encounter (Signed)
Patient states she is due for tsh and would like to have this done at her OV on 09/01/12. Can you place orders please?

## 2012-09-01 ENCOUNTER — Ambulatory Visit (INDEPENDENT_AMBULATORY_CARE_PROVIDER_SITE_OTHER): Payer: BC Managed Care – PPO | Admitting: Internal Medicine

## 2012-09-01 ENCOUNTER — Encounter: Payer: Self-pay | Admitting: Internal Medicine

## 2012-09-01 VITALS — BP 108/72 | HR 65 | Temp 98.1°F | Wt 137.0 lb

## 2012-09-01 DIAGNOSIS — F341 Dysthymic disorder: Secondary | ICD-10-CM

## 2012-09-01 DIAGNOSIS — R7309 Other abnormal glucose: Secondary | ICD-10-CM

## 2012-09-01 DIAGNOSIS — G5712 Meralgia paresthetica, left lower limb: Secondary | ICD-10-CM

## 2012-09-01 DIAGNOSIS — G5710 Meralgia paresthetica, unspecified lower limb: Secondary | ICD-10-CM

## 2012-09-01 DIAGNOSIS — F329 Major depressive disorder, single episode, unspecified: Secondary | ICD-10-CM

## 2012-09-01 DIAGNOSIS — E039 Hypothyroidism, unspecified: Secondary | ICD-10-CM

## 2012-09-01 DIAGNOSIS — F419 Anxiety disorder, unspecified: Secondary | ICD-10-CM

## 2012-09-01 DIAGNOSIS — R7303 Prediabetes: Secondary | ICD-10-CM | POA: Insufficient documentation

## 2012-09-01 HISTORY — DX: Prediabetes: R73.03

## 2012-09-01 LAB — TSH: TSH: 1.44 u[IU]/mL (ref 0.35–5.50)

## 2012-09-01 LAB — HEMOGLOBIN A1C: Hgb A1c MFr Bld: 5.2 % (ref 4.6–6.5)

## 2012-09-01 NOTE — Progress Notes (Signed)
  Subjective:    Patient ID: Jamie Patrick, female    DOB: 11/18/1955, 57 y.o.   MRN: [id number]  HPI Here to discuss several issues: Anxiety, good medication compliance, sees a Veterinary surgeon. Has multiple questions about this issue. Symptoms are relatively well controlled. Hypothyroidism, good medication compliance, due for a TSH. diabetes, last A1c was slightly elevated. Labs. Also several months history of on and off burning at the side of the left thigh.   Past Medical History  Diagnosis Date  . Anxiety and depression   . Hypothyroidism   . Allergic rhinitis   . CTS (carpal tunnel syndrome)     question of  . Vitamin D deficiency   . Osteopenia     as reported by pt, DEXA @ gyn  . Colon polyps     BENIGN  . Dysplasia of cervix, low grade (CIN 1)     h/o CIN-1/CIN2  . Prediabetes 09/01/2012   Past Surgical History  Procedure Laterality Date  . Cholecystectomy  2010  . Tonsillectomy    . Uterine polyps  2003    Dr.Fernadez/ RESECTOSCOPIC POLYPECTOMY  . Cryotherapy      FOR CERVICAL DYSPLASIA CIN-1/CIN-2  . Colonoscopy      repeat in 5 years  . Appendectomy       Review of Systems Denies back pain or lower extremity paresthesias. Not a rash in the leg. Continue with a number of challenges in his life particularly his job.    Objective:   Physical Exam   General -- alert, well-developed, NAD  Back--no tender to palpation   Extremities-- no pretibial edema bilaterally; rotation of the hips is normal, not tender at the trochanteric bursa  either side Neurologic-- alert & oriented X3 and strength normal in all extremities.DTRs symmetric. Psych-- Cognition and judgment appear intact. Alert and cooperative with normal attention span and concentration.  not anxious appearing and not depressed appearing.       Assessment & Plan:   Today , I spent more than 25 min with the patient, >50% of the time counseling   Meralgia paresthetica, New problem. Paresthesia in the left  leg likely  MP. Recommend observation.

## 2012-09-01 NOTE — Assessment & Plan Note (Addendum)
She continue challenges in her life particularly at her job. Symptoms are relatively well controlled. She wonders for how long does she need to be taken medication and ifs lorazepam is a good option instead of lexapro.   I think she will need Lexapro for the first year old future. I don't have a problem with that as long as he is working and she doesn't have side effects. I don't think lorazepam is a replacement for Lexapro. Alprazolam is helping with insomnia consequently we will  continue. Patient is counseled. Recommend to continue seeing a counselor. Also, previously reported difficulties with memory , saw Dr Gelene Mink, had  a MRI, saw a psychologist for further testing. Was told everything was okay.

## 2012-09-01 NOTE — Assessment & Plan Note (Signed)
Labs

## 2012-09-02 ENCOUNTER — Encounter: Payer: Self-pay | Admitting: Internal Medicine

## 2012-09-09 ENCOUNTER — Encounter: Payer: Self-pay | Admitting: Internal Medicine

## 2012-09-16 ENCOUNTER — Ambulatory Visit (INDEPENDENT_AMBULATORY_CARE_PROVIDER_SITE_OTHER): Payer: BC Managed Care – PPO | Admitting: Family Medicine

## 2012-09-16 VITALS — BP 98/62 | HR 69 | Temp 98.1°F | Resp 16 | Ht <= 58 in | Wt 137.0 lb

## 2012-09-16 DIAGNOSIS — Z113 Encounter for screening for infections with a predominantly sexual mode of transmission: Secondary | ICD-10-CM

## 2012-09-16 DIAGNOSIS — J029 Acute pharyngitis, unspecified: Secondary | ICD-10-CM

## 2012-09-16 DIAGNOSIS — N898 Other specified noninflammatory disorders of vagina: Secondary | ICD-10-CM

## 2012-09-16 LAB — POCT UA - MICROSCOPIC ONLY
Casts, Ur, LPF, POC: NEGATIVE
Crystals, Ur, HPF, POC: NEGATIVE
Mucus, UA: NEGATIVE
Yeast, UA: NEGATIVE

## 2012-09-16 LAB — POCT WET PREP WITH KOH
Clue Cells Wet Prep HPF POC: NEGATIVE
KOH Prep POC: NEGATIVE
Trichomonas, UA: NEGATIVE
Yeast Wet Prep HPF POC: NEGATIVE

## 2012-09-16 LAB — POCT URINALYSIS DIPSTICK
Bilirubin, UA: NEGATIVE
Blood, UA: NEGATIVE
Glucose, UA: NEGATIVE
Ketones, UA: NEGATIVE
Nitrite, UA: NEGATIVE
Protein, UA: NEGATIVE
Spec Grav, UA: 1.01
Urobilinogen, UA: 0.2
pH, UA: 7.5

## 2012-09-16 LAB — POCT RAPID STREP A (OFFICE): Rapid Strep A Screen: NEGATIVE

## 2012-09-16 MED ORDER — FLUCONAZOLE 150 MG PO TABS: 150.0000 mg | ORAL_TABLET | Freq: Once | ORAL | Status: DC

## 2012-09-16 MED ORDER — METRONIDAZOLE 500 MG PO TABS: 500.0000 mg | ORAL_TABLET | Freq: Two times a day (BID) | ORAL | Status: DC

## 2012-09-16 NOTE — Progress Notes (Signed)
[facility]:  Office Visit  Chief Complaint:  Chief Complaint  Patient presents with  . Vaginal Discharge    since monday  . Sore Throat    comes and goes-has had chills and laryngitis-all started this week as well    HPI: Jamie Patrick is a 57 y.o. female who complains of: 1. 3 day history of vaginal dc, it has been green, Denies fevers, chills, nv, abd pain or pelvic pain. She has not been sexually active for many years, since her husband has prostate cancer, she denies douching, she denies being on any recent meds/abx. No urinary sxs ie dysuria or increase frequency. No vaginal rashes/itching. 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 She is not due for pap until Sept/October.  2. 1 week h/o mucus from nose draining down to her throat. Today she had chills and laryngitis. History of strep throat. + allergies  Past Medical History  Diagnosis Date  . Anxiety and depression   . Hypothyroidism   . Allergic rhinitis   . CTS (carpal tunnel syndrome)     question of  . Vitamin D deficiency   . Osteopenia     as reported by pt, DEXA @ gyn  . Colon polyps     BENIGN  . Dysplasia of cervix, low grade (CIN 1)     h/o CIN-1/CIN2  . Prediabetes 09/01/2012   Past Surgical History  Procedure Laterality Date  . Cholecystectomy  2010  . Tonsillectomy    . Uterine polyps  2003    Dr.Fernadez/ RESECTOSCOPIC POLYPECTOMY  . Cryotherapy      FOR CERVICAL DYSPLASIA CIN-1/CIN-2  . Colonoscopy      repeat in 5 years  . Appendectomy     History   Social History  . Marital Status: Married    Spouse Name: N/A    Number of Children: 0  . Years of Education: N/A   Occupational History  .     Social History Main Topics  . Smoking status: Never Smoker   . Smokeless tobacco: Never Used  . Alcohol Use: No     Comment:    . Drug Use: No  . Sexually Active: No   Other Topics Concern  . None   Social History Narrative   Original from [place]--- diet:    Family History  Problem  Relation Age of Onset  . Diabetes Brother     uncles, GM  . Hypertension Brother   . Heart attack Neg Hx   . Colon cancer Neg Hx   . Breast cancer      distant cousins  . Osteoporosis Sister     2 sisters    Allergies  Allergen Reactions  . Ceftriaxone Sodium     REACTION: nausea  . Cephalosporins Nausea And Vomiting  . Sulfonamide Derivatives     REACTION: near syncope, N\T\V   Prior to Admission medications   Medication Sig Start Date End Date Taking? Authorizing Provider  ALPRAZolam Prudy Feeler) 0.5 MG tablet TAKE ONE TO TWO TABLETS BY MOUTH AT BEDTIME AS NEEDED FOR SLEEP 05/12/12  Yes Wanda Plump, MD  Calcium Carbonate-Vitamin D (CALCIUM + D PO) Take by mouth.     Yes Historical Provider, MD  cyclobenzaprine (FLEXERIL) 5 MG tablet Take 1 tablet (5 mg total) by mouth 2 (two) times daily as needed. 03/26/12  Yes Wanda Plump, MD  escitalopram (LEXAPRO) 20 MG tablet Take 1.5 tablets (30 mg total) by mouth daily. 03/26/12  Yes Wanda Plump, MD  estradiol (ESTRACE) 1 MG tablet Take 1 tablet (1 mg total) by mouth daily. 06/07/12  Yes Ok Edwards, MD  fish oil-omega-3 fatty acids 1000 MG capsule Take 2 g by mouth daily.   Yes Historical Provider, MD  levothyroxine (SYNTHROID, LEVOTHROID) 50 MCG tablet Take 1 tablet (50 mcg total) by mouth daily. 10/28/11  Yes Wanda Plump, MD  medroxyPROGESTERone (PROVERA) 10 MG tablet Take one tablet daily first ten days of the month 06/07/12  Yes Ok Edwards, MD  Multiple Vitamin (MULTIVITAMIN) tablet Take 1 tablet by mouth daily.   Yes Historical Provider, MD     ROS: The patient denies fevers, chills, night sweats, unintentional weight loss, chest pain, palpitations, wheezing, dyspnea on exertion, nausea, vomiting, abdominal pain, dysuria, hematuria, melena, numbness, weakness, or tingling.   All other systems have been reviewed and were otherwise negative with the exception of those mentioned in the HPI and as above.    PHYSICAL EXAM: Filed Vitals:    09/16/12 1848  BP: 98/62  Pulse: 69  Temp: 98.1 F (36.7 C)  Resp: 16   Filed Vitals:   09/16/12 1848  Height: 4\' 10"  (1.473 m)  Weight: 137 lb (62.143 kg)   Body mass index is 28.64 kg/(m^2).  General: Alert, no acute distress HEENT:  Normocephalic, atraumatic, oropharynx patent. + eryhtematous throat. No exudates. TM nl. No sinus tenderness. Boggy nares.  Cardiovascular:  Regular rate and rhythm, no rubs murmurs or gallops.  No Carotid bruits, radial pulse intact. No pedal edema.  Respiratory: Clear to auscultation bilaterally.  No wheezes, rales, or rhonchi.  No cyanosis, no use of accessory musculature GI: No organomegaly, abdomen is soft and non-tender, positive bowel sounds.  No masses. Skin: No rashes. Neurologic: Facial musculature symmetric. Psychiatric: Patient is appropriate throughout our interaction. Lymphatic: No cervical lymphadenopathy Musculoskeletal: Gait intact. GU- green dc, cervix normal in appearance, no massees/lesions. No CMT   LABS: Results for orders placed in visit on 09/16/12  POCT RAPID STREP A (OFFICE)      Result Value Range   Rapid Strep A Screen Negative  Negative  POCT WET PREP WITH KOH      Result Value Range   Trichomonas, UA Negative     Clue Cells Wet Prep HPF POC neg     Epithelial Wet Prep HPF POC 2-4     Yeast Wet Prep HPF POC neg     Bacteria Wet Prep HPF POC 1+     RBC Wet Prep HPF POC 0-2     WBC Wet Prep HPF POC 25-30     KOH Prep POC Negative    POCT UA - MICROSCOPIC ONLY      Result Value Range   WBC, Ur, HPF, POC 1-3     RBC, urine, microscopic 0-1     Bacteria, U Microscopic trace     Mucus, UA neg     Epithelial cells, urine per micros 1-2     Crystals, Ur, HPF, POC neg     Casts, Ur, LPF, POC neg     Yeast, UA neg    POCT URINALYSIS DIPSTICK      Result Value Range   Color, UA yellow     Clarity, UA clear     Glucose, UA neg     Bilirubin, UA neg     Ketones, UA neg     Spec Grav, UA 1.010     Blood, UA neg  pH, UA 7.5     Protein, UA neg     Urobilinogen, UA 0.2     Nitrite, UA neg     Leukocytes, UA Trace       EKG/XRAY:   Primary read interpreted by Dr. Conley Rolls at [facility].   ASSESSMENT/PLAN: Encounter Diagnoses  Name Primary?  . Acute pharyngitis Yes  . Vaginal discharge    Will presumptively treat for BV and Yeast Will obtain GC just because of green DC I think her urine is a contaminant so will do urine cx If continues to drain vaginal dc and  no improvement then return for pap since she has a history of HOV and is worried about cervical cancer Take antihistamine otc F/u prn     Kiondre Grenz PHUONG, DO 09/16/2012 8:04 PM

## 2012-09-18 LAB — URINE CULTURE: Colony Count: 4000

## 2012-09-18 LAB — GC/CHLAMYDIA PROBE AMP
CT Probe RNA: NEGATIVE
GC Probe RNA: NEGATIVE

## 2012-09-21 ENCOUNTER — Telehealth: Payer: Self-pay | Admitting: Family Medicine

## 2012-09-21 NOTE — Telephone Encounter (Signed)
LM to call me back. Left general message that labs were negative. IF she is not feeling better and still having dx after Flagyl then need to f.u for pap

## 2012-09-25 ENCOUNTER — Other Ambulatory Visit: Payer: Self-pay | Admitting: Internal Medicine

## 2012-09-27 NOTE — Telephone Encounter (Signed)
Refill done.  

## 2012-11-02 ENCOUNTER — Encounter: Payer: Self-pay | Admitting: *Deleted

## 2012-11-02 ENCOUNTER — Encounter: Payer: BC Managed Care – PPO | Admitting: Internal Medicine

## 2012-11-02 ENCOUNTER — Encounter: Payer: Self-pay | Admitting: Internal Medicine

## 2012-11-02 ENCOUNTER — Ambulatory Visit (INDEPENDENT_AMBULATORY_CARE_PROVIDER_SITE_OTHER): Payer: BC Managed Care – PPO | Admitting: Internal Medicine

## 2012-11-02 VITALS — BP 110/70 | HR 76 | Temp 97.8°F | Ht <= 58 in | Wt 138.0 lb

## 2012-11-02 DIAGNOSIS — Z0000 Encounter for general adult medical examination without abnormal findings: Secondary | ICD-10-CM

## 2012-11-02 DIAGNOSIS — F329 Major depressive disorder, single episode, unspecified: Secondary | ICD-10-CM

## 2012-11-02 DIAGNOSIS — G4700 Insomnia, unspecified: Secondary | ICD-10-CM

## 2012-11-02 DIAGNOSIS — M8580 Other specified disorders of bone density and structure, unspecified site: Secondary | ICD-10-CM

## 2012-11-02 LAB — CBC WITH DIFFERENTIAL/PLATELET
Basophils Relative: 0.6 % (ref 0.0–3.0)
Eosinophils Relative: 1.4 % (ref 0.0–5.0)
HCT: 38.5 % (ref 36.0–46.0)
Hemoglobin: 12.8 g/dL (ref 12.0–15.0)
Lymphs Abs: 2.9 10*3/uL (ref 0.7–4.0)
MCV: 95.8 fl (ref 78.0–100.0)
Monocytes Absolute: 0.3 10*3/uL (ref 0.1–1.0)
RBC: 4.02 Mil/uL (ref 3.87–5.11)
WBC: 6 10*3/uL (ref 4.5–10.5)

## 2012-11-02 LAB — COMPREHENSIVE METABOLIC PANEL
ALT: 17 U/L (ref 0–35)
CO2: 27 mEq/L (ref 19–32)
Calcium: 9 mg/dL (ref 8.4–10.5)
Chloride: 102 mEq/L (ref 96–112)
Creatinine, Ser: 0.6 mg/dL (ref 0.4–1.2)
GFR: 114.3 mL/min (ref >60.00)
Sodium: 134 mEq/L — ABNORMAL LOW (ref 135–145)
Total Protein: 7.2 g/dL (ref 6.0–8.3)

## 2012-11-02 MED ORDER — ALPRAZOLAM 0.5 MG PO TABS: ORAL_TABLET | ORAL | Status: DC

## 2012-11-02 MED ORDER — LEVOTHYROXINE SODIUM 50 MCG PO TABS: 50.0000 ug | ORAL_TABLET | Freq: Every day | ORAL | Status: DC

## 2012-11-02 MED ORDER — ESCITALOPRAM OXALATE 20 MG PO TABS: 30.0000 mg | ORAL_TABLET | Freq: Every day | ORAL | Status: DC

## 2012-11-02 NOTE — Assessment & Plan Note (Signed)
Refill Xanax as needed

## 2012-11-02 NOTE — Progress Notes (Signed)
  Subjective:    Patient ID: Jamie Patrick, female    DOB: 1955-07-20, 57 y.o.   MRN: [id number]  HPI Complete physical exam  Past Medical History  Diagnosis Date  . Anxiety and depression   . Hypothyroidism   . Allergic rhinitis   . CTS (carpal tunnel syndrome)     question of  . Vitamin D deficiency   . Osteopenia     as reported by pt, DEXA @ gyn  . Colon polyps     BENIGN  . Dysplasia of cervix, low grade (CIN 1)     h/o CIN-1/CIN2  . Prediabetes 09/01/2012   Past Surgical History  Procedure Laterality Date  . Cholecystectomy  2010  . Tonsillectomy    . Uterine polyps  2003    Dr.Fernadez/ RESECTOSCOPIC POLYPECTOMY  . Cryotherapy      FOR CERVICAL DYSPLASIA CIN-1/CIN-2  . Colonoscopy      repeat in 5 years  . Appendectomy      History   Social History  . Marital Status: Married    Spouse Name: N/A    Number of Children: 0  . Years of Education: N/A   Occupational History  . financial services    Social History Main Topics  . Smoking status: Never Smoker   . Smokeless tobacco: Never Used  . Alcohol Use: No     Comment:    . Drug Use: No  . Sexually Active: No   Other Topics Concern  . Not on file   Social History Narrative   Original from [place]-     Family History  Problem Relation Age of Onset  . Diabetes Brother     uncles, GM  . Hypertension Brother   . Heart attack Neg Hx   . Colon cancer Neg Hx   . Breast cancer Other     distant cousins  . Osteoporosis Sister     2 sisters     Review of Systems Diet--trying to decrease carbohydrates intake Exercise--improving slowly. No chest pain shortness or breath. No nausea, vomiting, diarrhea or blood in the stools. No dysuria gross hematuria. Anxiety depression much improved    Objective:   Physical Exam BP 110/70  Pulse 76  Temp(Src) 97.8 F (36.6 C) (Oral)  Ht 4\' 10"  (1.473 m)  Wt 138 lb (62.596 kg)  BMI 28.85 kg/m2  SpO2 98%  LMP 02/03/2009  General -- alert, well-developed,  NAD.   Neck --no thyromegaly , normal carotid pulse Lungs -- normal respiratory effort, no intercostal retractions, no accessory muscle use, and normal breath sounds.   Heart-- normal rate, regular rhythm, no murmur, and no gallop.   Abdomen--soft, non-tender, no distention, no masses   Extremities-- no pretibial edema bilaterally Neurologic-- alert & oriented X3 and strength normal in all extremities. Psych-- Cognition and judgment appear intact. Alert and cooperative with normal attention span and concentration.  not anxious appearing and not depressed appearing.       Assessment & Plan:

## 2012-11-02 NOTE — Assessment & Plan Note (Signed)
Much improved with current regimen. Refill meds. She had to quit doing counseling ($) but again she is feeling well

## 2012-11-02 NOTE — Assessment & Plan Note (Signed)
Bone density test at gyn 06-2012, T score -1.8

## 2012-11-02 NOTE — Assessment & Plan Note (Signed)
Td 2007   zostavax-- pt's husband immunosuppressed, unable to take  Saw gyn 06-2012 11-2011 [facility] (-) Cscope Dr [address], aprox 2006 and again 04-2010, next in 5 years per report   counseled ref diet - exercise  Labs

## 2012-11-03 ENCOUNTER — Encounter: Payer: Self-pay | Admitting: Internal Medicine

## 2012-11-03 NOTE — Telephone Encounter (Signed)
Please advise. Thanks.  

## 2012-11-10 ENCOUNTER — Other Ambulatory Visit: Payer: Self-pay | Admitting: Gynecology

## 2012-11-10 DIAGNOSIS — Z1231 Encounter for screening mammogram for malignant neoplasm of breast: Secondary | ICD-10-CM

## 2012-11-30 ENCOUNTER — Encounter: Payer: Self-pay | Admitting: Internal Medicine

## 2012-11-30 ENCOUNTER — Telehealth: Payer: Self-pay | Admitting: *Deleted

## 2012-11-30 MED ORDER — MEDROXYPROGESTERONE ACETATE 10 MG PO TABS: ORAL_TABLET | ORAL | Status: DC

## 2012-11-30 NOTE — Telephone Encounter (Signed)
Pt called requesting 3 month supply of provera 10 mg which she takes day 1-10 of each month per office note on 06/07/12

## 2012-12-13 ENCOUNTER — Telehealth: Payer: Self-pay | Admitting: Internal Medicine

## 2012-12-13 NOTE — Telephone Encounter (Signed)
Patient is calling to request an rx for Escitalopram to be sent to [facility] in [place]. She says that she needs the prescription directions to be changed to "take one tablet" instead of taking 1.5 tablets because they are having a sale on this drug and she cannot get the sale with it written this way. Patient says that she is losing her job this week and really needs this Pharmacist, hospital. Please advise.

## 2012-12-14 ENCOUNTER — Other Ambulatory Visit: Payer: Self-pay | Admitting: *Deleted

## 2012-12-14 MED ORDER — ESCITALOPRAM OXALATE 20 MG PO TABS: ORAL_TABLET | ORAL | Status: DC

## 2012-12-14 NOTE — Telephone Encounter (Signed)
Patient is calling back in regards to time-sensitive question below. Please advise.

## 2012-12-14 NOTE — Telephone Encounter (Signed)
Are you ok with changing this?

## 2012-12-14 NOTE — Telephone Encounter (Signed)
rx refilled per protocol. DJR  

## 2012-12-14 NOTE — Telephone Encounter (Signed)
Okay to change the sig to "as directed",   #180 and one refill. Tell  patient she needs to be taking the same dose which is 1.5 tablets daily

## 2012-12-14 NOTE — Addendum Note (Signed)
Addended by: Eustace Quail on: 12/14/2012 03:30 PM   Modules accepted: Orders

## 2012-12-20 ENCOUNTER — Ambulatory Visit (HOSPITAL_COMMUNITY): Payer: BC Managed Care – PPO

## 2013-01-01 ENCOUNTER — Telehealth: Payer: Self-pay

## 2013-01-01 NOTE — Telephone Encounter (Signed)
Dr Conley Rolls, Patient is needing a

## 2013-01-14 ENCOUNTER — Telehealth (INDEPENDENT_AMBULATORY_CARE_PROVIDER_SITE_OTHER): Payer: Managed Care, Other (non HMO) | Admitting: Women's Health

## 2013-01-14 ENCOUNTER — Encounter: Payer: Self-pay | Admitting: Women's Health

## 2013-01-14 DIAGNOSIS — N898 Other specified noninflammatory disorders of vagina: Secondary | ICD-10-CM

## 2013-01-14 DIAGNOSIS — B373 Candidiasis of vulva and vagina: Secondary | ICD-10-CM

## 2013-01-14 LAB — WET PREP FOR TRICH, YEAST, CLUE: Trich, Wet Prep: NONE SEEN

## 2013-01-14 MED ORDER — FLUCONAZOLE 150 MG PO TABS: 150.0000 mg | ORAL_TABLET | Freq: Once | ORAL | Status: DC

## 2013-01-14 NOTE — Patient Instructions (Signed)
Vaginitis moniliásica  (Monilial Vaginitis)  La vaginitis es una inflamación (irritación, hinchazón) de la vagina y la vulva. Esta no es una enfermedad de transmisión sexual.   CAUSAS  Este tipo de vaginitis lo causa un hongo (candida) que normalmente se encuentra en la vagina. El hongo candida se ha desarrollado hasta el punto de ocasionar problemas en el equilibrio químico.  SÍNTOMAS  · Secreción vaginal espesa y blanca.  · Hinchazón, picazón, enrojecimiento e inflamación de la vagina y en algunos casos de los labios vaginales (vulva).  · Ardor o dolor al orinar.  · Dolor en las relaciones sexuales.  DIAGNÓSTICO  Los factores que favorecen la vaginitis moniliasica son:  · Etapas de virginidad y postmenopáusicas.  · Embarazo.  · Infecciones.  · Sentir cansancio, estar enferma o estresada, especialmente si ya ha sufrido este problema en el pasado.  · Diabetes Buen control ayudará a disminuír la probabilidad.  · Píldoras anticonceptivas  · Ropa interior muy ajustada.  · El uso de espumas de baño, aerosoles femeninos duchas vaginales o tampones con desodorante.  · Algunos antibióticos (medicamentos que destruyen gérmenes).  · Si contrae alguna enfermedad puede sufrir recurrencias esporádicas.  TRATAMIENTO  El profesional que lo asiste prescribirá medicamentos.  · Hay diferentes tipos de cremas y supositorios vaginales que tratan específicamente la vaginitis moniliásica. Para infecciones por hongos recurrentes, utilice un supositorio o crema en la vagina dos veces por semana, o según se le indique.  · También podrán utilizarse cremas con corticoides o anti moniliásicas para la picazón o la irritación de la vulva. Consulte con el profesional que la asiste.  · Si la crema no da resultado, podrá aplicarse en la vagina una solución con azul de metileno.  · El consumo de yogur puede prevenir este tipo de vaginitis.  INSTRUCCIONES PARA EL CUIDADO DOMICILIARIO  · Tome todos los medicamentos tal como se le indicó.  · No  mantenga relaciones sexuales hasta que el tratamiento se haya completado, o según las indicaciones del profesional que la asiste.  · Tome baños de asiento tibios.  · No se aplique duchas vaginales.  · No utilice tampones, especialmente los perfumados.  · Use ropa interior de algodón  · Evite los pantalones ajustados y las medias tipo panty.  · Comunique a sus compañeros sexuales que sufre una infección por hongos. Ellos deben concurrir para un control médico si tienen síntomas como una urticaria leve o picazón.  · Sus compañeros sexuales deben tratarse también si la infección es difícil de eliminar.  · Practique el sexo seguro  use condones  · Algunos medicamentos vaginales ocasionan fallas en los condones de látex. Los medicamentos vaginales que pueden dañar los condones son:  · Crema cleocina  · Butoconazole (Femstat®)  · Terconazole (Terazol®) supositorios vaginales  · Miconazole (Monistat®) (es un medicamento de venta libre)  SOLICITE ATENCIÓN MÉDICA SI:  · Usted tiene una temperatura oral de más de 38,9° C (102° F).  · Si la infección empeora luego de 2 días de tratamiento.  · Si la infección no mejora luego de 3 días de tratamiento.  · Aparecen ampollas en o alrededor de la vagina.  · Si aparece una hemorragia vaginal y no es el momento del período.  · Siente dolor al orinar.  · Presenta problemas intestinales.  · Tiene dolor durante las relaciones sexuales.  Document Released: 01/01/2005 Document Revised: 06/16/2011  Exit[employer]® Patient Information ©2014 [employer].

## 2013-01-14 NOTE — Progress Notes (Signed)
Patient ID: Jamie Patrick, female   DOB: [birth year], 56 y.o.   MRN: [id number] Presents with green-yellow vaginal discharge after using over-the-counter Monistat/ itching/  vaginal discomfort intermittently x 1 month.  Used OTC cream with some relief.  Symptoms returned 2 weeks later, Urgent care prescribed Diflucan 15 mg po once with relief for a few weeks.  Denies odor, abdominal discomfort, urinary symptoms. Not sexually active/husbands poor health. Has not used oral  estrogen/progesterone consistently.  Exam: Appears well.  External genitalia atrophied and erythematous.  Speculum exam: vaginal walls erythematous/ minimal discharge.  Cervix normal. Wet prep positive for yeast.  Yeast infection Atrophic vaginitis  Plan: Fluconazole 150 mg po once, 2 refills.  Yeast prevention reviewed.  Estradiol vaginal cream samples, use small amount twice weekly externally after yeast symptoms resolve.  Call/ return with no symptom improvement.

## 2013-01-14 NOTE — Addendum Note (Signed)
Addended by: Richardson Chiquito on: 01/14/2013 02:12 PM   Modules accepted: Orders

## 2013-02-02 ENCOUNTER — Ambulatory Visit (INDEPENDENT_AMBULATORY_CARE_PROVIDER_SITE_OTHER): Payer: Managed Care, Other (non HMO) | Admitting: Internal Medicine

## 2013-02-02 ENCOUNTER — Encounter: Payer: Self-pay | Admitting: Internal Medicine

## 2013-02-02 ENCOUNTER — Ambulatory Visit (HOSPITAL_COMMUNITY): Payer: BC Managed Care – PPO

## 2013-02-02 VITALS — BP 99/63 | HR 77 | Temp 98.4°F | Wt 142.0 lb

## 2013-02-02 DIAGNOSIS — M549 Dorsalgia, unspecified: Secondary | ICD-10-CM

## 2013-02-02 MED ORDER — PREDNISONE 10 MG PO TABS: ORAL_TABLET | ORAL | Status: DC

## 2013-02-02 MED ORDER — CYCLOBENZAPRINE HCL 5 MG PO TABS: 5.0000 mg | ORAL_TABLET | Freq: Two times a day (BID) | ORAL | Status: DC | PRN

## 2013-02-02 NOTE — Assessment & Plan Note (Signed)
Acute lumbalgia, doubt radiculopathy Plan: Rest, ice, continue Flexeril, prednisone (last A1c wnl) , Tylenol. See instructions

## 2013-02-02 NOTE — Patient Instructions (Signed)
Rest, no heavy lifting for 2 weeks Prednisone as prescribed Tylenol  500 mg OTC 2 tabs a day every 8 hours as needed for pain Cyclobenzaprine as needed Ice  as needed Call if not gradually improving     Back Pain, Adult Low back pain is very common. About 1 in 5 people have back pain.The cause of low back pain is rarely dangerous. The pain often gets better over time.About half of people with a sudden onset of back pain feel better in just 2 weeks. About 8 in 10 people feel better by 6 weeks.  CAUSES Some common causes of back pain include:  Strain of the muscles or ligaments supporting the spine.  Wear and tear (degeneration) of the spinal discs.  Arthritis.  Direct injury to the back. DIAGNOSIS Most of the time, the direct cause of low back pain is not known.However, back pain can be treated effectively even when the exact cause of the pain is unknown.Answering your caregiver's questions about your overall health and symptoms is one of the most accurate ways to make sure the cause of your pain is not dangerous. If your caregiver needs more information, he or she may order lab work or imaging tests (X-rays or MRIs).However, even if imaging tests show changes in your back, this usually does not require surgery. HOME CARE INSTRUCTIONS For many people, back pain returns.Since low back pain is rarely dangerous, it is often a condition that people can learn to [place] their own.   Remain active. It is stressful on the back to sit or stand in one place. Do not sit, drive, or stand in one place for more than 30 minutes at a time. Take short walks on level surfaces as soon as pain allows.Try to increase the length of time you walk each day.  Do not stay in bed.Resting more than 1 or 2 days can delay your recovery.  Do not avoid exercise or work.Your body is made to move.It is not dangerous to be active, even though your back may hurt.Your back will likely heal faster if you  return to being active before your pain is gone.  Pay attention to your body when you bend and lift. Many people have less discomfortwhen lifting if they bend their knees, keep the load close to their bodies,and avoid twisting. Often, the most comfortable positions are those that put less stress on your recovering back.  Find a comfortable position to sleep. Use a firm mattress and lie on your side with your knees slightly bent. If you lie on your back, put a pillow under your knees.  Only take over-the-counter or prescription medicines as directed by your caregiver. Over-the-counter medicines to reduce pain and inflammation are often the most helpful.Your caregiver may prescribe muscle relaxant drugs.These medicines help dull your pain so you can more quickly return to your normal activities and healthy exercise.  Put ice on the injured area.  Put ice in a plastic bag.  Place a towel between your skin and the bag.  Leave the ice on for 15-20 minutes, 3-4 times a day for the first 2 to 3 days. After that, ice and heat may be alternated to reduce pain and spasms.  Ask your caregiver about trying back exercises and gentle massage. This may be of some benefit.  Avoid feeling anxious or stressed.Stress increases muscle tension and can worsen back pain.It is important to recognize when you are anxious or stressed and learn ways to manage it.Exercise is a  great option. SEEK MEDICAL CARE IF:  You have pain that is not relieved with rest or medicine.  You have pain that does not improve in 1 week.  You have new symptoms.  You are generally not feeling well. SEEK IMMEDIATE MEDICAL CARE IF:   You have pain that radiates from your back into your legs.  You develop new bowel or bladder control problems.  You have unusual weakness or numbness in your arms or legs.  You develop nausea or vomiting.  You develop abdominal pain.  You feel faint. Document Released: 03/24/2005 Document  Revised: 09/23/2011 Document Reviewed: 08/12/2010 [facility] Patient Information 2014 [place], [place].

## 2013-02-02 NOTE — Progress Notes (Signed)
  Subjective:    Patient ID: Jamie Patrick, female    DOB: 1955/12/09, 57 y.o.   MRN: [id number]  HPI Acute visit 2 weeks history of low back pain,L>R, some radiation to the sides up to mid thigh. Pain is steady, worse by moving her torso, symptoms started   after she started zumba classes.    Past Medical History  Diagnosis Date  . Anxiety and depression   . Hypothyroidism   . Allergic rhinitis   . CTS (carpal tunnel syndrome)     question of  . Vitamin D deficiency   . Osteopenia     as reported by pt, DEXA @ gyn  . Colon polyps     BENIGN  . Dysplasia of cervix, low grade (CIN 1)     h/o CIN-1/CIN2  . Prediabetes 09/01/2012   Past Surgical History  Procedure Laterality Date  . Cholecystectomy  2010  . Tonsillectomy    . Uterine polyps  2003    Dr.Fernadez/ RESECTOSCOPIC POLYPECTOMY  . Cryotherapy      FOR CERVICAL DYSPLASIA CIN-1/CIN-2  . Colonoscopy      repeat in 5 years  . Appendectomy     History   Social History  . Marital Status: Married    Spouse Name: N/A    Number of Children: 0  . Years of Education: N/A   Occupational History  . unemployed since ~12-2012    Social History Main Topics  . Smoking status: Never Smoker   . Smokeless tobacco: Never Used  . Alcohol Use: No     Comment:    . Drug Use: No  . Sexual Activity: No   Other Topics Concern  . Not on file   Social History Narrative   Original from [place]-       Review of Systems No fever chills, no bladder or bowel incontinence No rash anywhere Has chronic left hip pain, that is actually well controlled at the present time. No actual injury.    Objective:   Physical Exam  Musculoskeletal:       Legs:  BP 99/63  Pulse 77  Temp(Src) 98.4 F (36.9 C)  Wt 142 lb (64.411 kg)  BMI 29.69 kg/m2  SpO2 98%  LMP 02/03/2009 General -- alert, well-developed, NAD.  Abdomen-- Not distended, good bowel sounds,soft, non-tender. No rebound or rigidity.  Extremities-- no pretibial edema  bilaterally  Neurologic--  alert & oriented X3. Speech normal, gait normal, strength normal in all extremities. Straight leg test negative DTRs symmetric thhroughout Psych-- Cognition and judgment appear intact. Cooperative with normal attention span and concentration. No anxious appearing , no depressed appearing.     Assessment & Plan:

## 2013-02-10 ENCOUNTER — Other Ambulatory Visit: Payer: Self-pay

## 2013-02-22 ENCOUNTER — Ambulatory Visit (HOSPITAL_COMMUNITY)
Admission: RE | Admit: 2013-02-22 | Discharge: 2013-02-22 | Disposition: A | Discharge status: Home / Self Care | Payer: Managed Care, Other (non HMO) | Source: Ambulatory Visit | Attending: Gynecology | Admitting: Gynecology

## 2013-02-22 DIAGNOSIS — Z1231 Encounter for screening mammogram for malignant neoplasm of breast: Secondary | ICD-10-CM

## 2013-02-26 ENCOUNTER — Ambulatory Visit (INDEPENDENT_AMBULATORY_CARE_PROVIDER_SITE_OTHER): Payer: Managed Care, Other (non HMO) | Admitting: Emergency Medicine

## 2013-02-26 VITALS — BP 112/60 | HR 70 | Temp 98.5°F | Resp 18 | Ht 59.0 in | Wt 140.6 lb

## 2013-02-26 DIAGNOSIS — J018 Other acute sinusitis: Secondary | ICD-10-CM

## 2013-02-26 MED ORDER — AMOXICILLIN-POT CLAVULANATE 875-125 MG PO TABS: 1.0000 | ORAL_TABLET | Freq: Two times a day (BID) | ORAL | Status: DC

## 2013-02-26 MED ORDER — PSEUDOEPHEDRINE-GUAIFENESIN ER 60-600 MG PO TB12: 1.0000 | ORAL_TABLET | Freq: Two times a day (BID) | ORAL | Status: DC

## 2013-02-26 NOTE — Patient Instructions (Signed)

## 2013-02-26 NOTE — Progress Notes (Signed)
[facility], [place] [address] [place] [address] [phone]  Date:  02/26/2013   Name:  Jamie Patrick   DOB:  1955/11/19   MRN:  [id number]  PCP:  Willow Ora, MD    Chief Complaint: Headache, Nasal Congestion, Sore Throat and Otitis Media   History of Present Illness:  Jamie Patrick is a 57 y.o. very pleasant female patient who presents with the following:  Ill with nasal discharge and congestion.  No fever or chills.  Nasal drainage is purulent.  Cough is productive purulent sputum.  No nausea or vomiting.  No wheezing or shortness of breath.  No improvement with over the counter medications or other home remedies. Denies other complaint or health concern today.   Patient Active Problem List   Diagnosis Date Noted  . Prediabetes 09/01/2012  . Menopausal state 06/07/2012  . Osteopenia 03/02/2012  . Weight gain 03/02/2012  . Annual physical exam 10/23/2010  . Involuntary movements 10/23/2010  . Neck pain 08/22/2010  . BACK PAIN 09/05/2009  . INSOMNIA-SLEEP DISORDER-UNSPEC 09/05/2009  . LACTOSE INTOLERANCE 11/02/2008  . HIP PAIN, LEFT, CHRONIC 03/15/2008  . ALLERGIC RHINITIS 08/04/2007  . 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 HYPOTHYROIDISM 02/03/2007  . Anxiety and depression 10/28/2006    Past Medical History  Diagnosis Date  . Anxiety and depression   . Hypothyroidism   . Allergic rhinitis   . CTS (carpal tunnel syndrome)     question of  . Vitamin D deficiency   . Osteopenia     as reported by pt, DEXA @ gyn  . Colon polyps     BENIGN  . Dysplasia of cervix, low grade (CIN 1)     h/o CIN-1/CIN2  . Prediabetes 09/01/2012    Past Surgical History  Procedure Laterality Date  . Cholecystectomy  2010  . Tonsillectomy    . Uterine polyps  2003    Dr.Fernadez/ RESECTOSCOPIC POLYPECTOMY  . Cryotherapy      FOR CERVICAL DYSPLASIA CIN-1/CIN-2  . Colonoscopy      repeat in 5 years  . Appendectomy      History  Substance Use  Topics  . Smoking status: Never Smoker   . Smokeless tobacco: Never Used  . Alcohol Use: No     Comment:      Family History  Problem Relation Age of Onset  . Diabetes Brother     uncles, GM  . Hypertension Brother   . Heart attack Neg Hx   . Colon cancer Neg Hx   . Breast cancer Other     distant cousins  . Osteoporosis Sister     2 sisters     Allergies  Allergen Reactions  . Ceftriaxone Sodium     REACTION: nausea  . Cephalosporins Nausea And Vomiting  . Sulfonamide Derivatives     REACTION: near syncope, N\T\V    Medication list has been reviewed and updated.  Current Outpatient Prescriptions on File Prior to Visit  Medication Sig Dispense Refill  . ALPRAZolam (XANAX) 0.5 MG tablet TAKE ONE TO TWO TABLETS BY MOUTH AT BEDTIME AS NEEDED FOR SLEEP  60 tablet  3  . Calcium Carbonate-Vitamin D (CALCIUM + D PO) Take by mouth.        . cyclobenzaprine (FLEXERIL) 5 MG tablet Take 1 tablet (5 mg total) by mouth 2 (two) times daily as needed.  60 tablet  1  . escitalopram (LEXAPRO) 20 MG tablet As directed  180  tablet  1  . estradiol (ESTRACE) 1 MG tablet Take 1 tablet (1 mg total) by mouth daily.  90 tablet  4  . fish oil-omega-3 fatty acids 1000 MG capsule Take 2 g by mouth daily.      Marland Kitchen levothyroxine (SYNTHROID, LEVOTHROID) 50 MCG tablet Take 1 tablet (50 mcg total) by mouth daily.  90 tablet  1  . Multiple Vitamin (MULTIVITAMIN) tablet Take 1 tablet by mouth daily.      Marland Kitchen PROGESTERONE MICRONIZED PO Take by mouth.      . fluconazole (DIFLUCAN) 150 MG tablet Take 1 tablet (150 mg total) by mouth once.  1 tablet  2  . predniSONE (DELTASONE) 10 MG tablet 4 tablets x 2 days, 3 tabs x 2 days, 2 tabs x 2 days, 1 tab x 2 days  20 tablet  0   No current facility-administered medications on file prior to visit.    Review of Systems:  As per HPI, otherwise negative.    Physical Examination: Filed Vitals:   02/26/13 1255  BP: 112/60  Pulse: 70  Temp: 98.5 F (36.9 C)   Resp: 18   Filed Vitals:   02/26/13 1255  Height: 4\' 11"  (1.499 m)  Weight: 140 lb 9.6 oz (63.776 kg)   Body mass index is 28.38 kg/(m^2). Ideal Body Weight: Weight in (lb) to have BMI = 25: 123.5  GEN: WDWN, NAD, Non-toxic, A & O x 3 HEENT: Atraumatic, Normocephalic. Neck supple. No masses, No LAD. Ears and Nose: No external deformity. CV: RRR, No M/G/R. No JVD. No thrill. No extra heart sounds. PULM: CTA B, no wheezes, crackles, rhonchi. No retractions. No resp. distress. No accessory muscle use. ABD: S, NT, ND, +BS. No rebound. No HSM. EXTR: No c/c/e NEURO Normal gait.  PSYCH: Normally interactive. Conversant. Not depressed or anxious appearing.  Calm demeanor.    Assessment and Plan: Sinusitis augmentin  mucinex d  Signed,  Phillips Odor, MD

## 2013-03-18 ENCOUNTER — Ambulatory Visit (INDEPENDENT_AMBULATORY_CARE_PROVIDER_SITE_OTHER): Payer: Managed Care, Other (non HMO) | Admitting: Women's Health

## 2013-03-18 ENCOUNTER — Encounter: Payer: Self-pay | Admitting: Women's Health

## 2013-03-18 ENCOUNTER — Other Ambulatory Visit (HOSPITAL_COMMUNITY)
Admission: RE | Admit: 2013-03-18 | Discharge: 2013-03-18 | Disposition: A | Discharge status: Home / Self Care | Payer: Managed Care, Other (non HMO) | Source: Ambulatory Visit | Attending: Gynecology | Admitting: Gynecology

## 2013-03-18 VITALS — BP 118/70 | Ht 58.5 in | Wt 138.0 lb

## 2013-03-18 DIAGNOSIS — B373 Candidiasis of vulva and vagina: Secondary | ICD-10-CM

## 2013-03-18 DIAGNOSIS — M899 Disorder of bone, unspecified: Secondary | ICD-10-CM

## 2013-03-18 DIAGNOSIS — Z01419 Encounter for gynecological examination (general) (routine) without abnormal findings: Secondary | ICD-10-CM | POA: Insufficient documentation

## 2013-03-18 DIAGNOSIS — M8580 Other specified disorders of bone density and structure, unspecified site: Secondary | ICD-10-CM

## 2013-03-18 MED ORDER — FLUCONAZOLE 150 MG PO TABS: 150.0000 mg | ORAL_TABLET | Freq: Once | ORAL | Status: DC

## 2013-03-18 NOTE — Progress Notes (Signed)
Jamie Patrick Dec 11, 1955 [id number]    History:    The patient presents for annual exam.  Postmenopausal/no bleeding has weaned down from HRT and is ready to quit. 06/2012 Osteopenia T score -1.6 left femoral neck, FRAX 14%/.8%. Labs primary care. Normal mammograms. 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 Negative colonoscopy 2012. 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  Past medical history, past surgical history, family history and social history were all reviewed and documented in the EPIC chart. Hypothyroid. Brother diabetes and hypercholesterolemia.  ROS:  A  ROS was performed and pertinent positives and negatives are included in the history.  Exam:  Filed Vitals:   03/18/13 1110  BP: 118/70    General appearance:  Normal Head/Neck:  Normal, without cervical or supraclavicular adenopathy. Thyroid:  Symmetrical, normal in size, without palpable masses or nodularity. Respiratory  Effort:  Normal  Auscultation:  Clear without wheezing or rhonchi Cardiovascular  Auscultation:  Regular rate, without rubs, murmurs or gallops  Edema/varicosities:  Not grossly evident Abdominal  Soft,nontender, without masses, guarding or rebound.  Liver/spleen:  No organomegaly noted  Hernia:  None appreciated  Skin  Inspection:  Grossly normal  Palpation:  Grossly normal Neurologic/psychiatric  Orientation:  Normal with appropriate conversation.  Mood/affect:  Normal  Genitourinary    Breasts: Examined lying and sitting.     Right: Without masses, retractions, discharge or axillary adenopathy.     Left: Without masses, retractions, discharge or axillary adenopathy.   Inguinal/mons:  Normal without inguinal adenopathy  External genitalia:  Normal  BUS/Urethra/Skene's glands:  Normal  Bladder:  Normal  Vagina:  Normal  Cervix:  Normal  Uterus:   normal in size, shape and contour.  Midline and mobile  Adnexa/parametria:     Rt: Without  masses or tenderness.   Lt: Without masses or tenderness.  Anus and perineum: Normal  Digital rectal exam: Normal sphincter tone without palpated masses or tenderness  Assessment/Plan:  57 y.o. MHF G0  for annual exam.    Anxiety/depression-Lexapro Hypothyroid-primary care manages labs and meds 2000 LEEP CIN-2,    2002 cryo- CIN-1 Osteopenia  Plan: SBE's, continue annual mammogram, calcium rich diet, vitamin D 2000 daily. Home safety and fall prevention discussed. Lexapro proper use reviewed primary care prescribes. Reviewed importance of exercise and calcium rich diet for bone health. Pap. New screening guidelines reviewed.   Harrington Challenger [facility], 11:47 AM 03/18/2013

## 2013-03-18 NOTE — Patient Instructions (Signed)
Health Recommendations for Postmenopausal Women Respected and ongoing research has looked at the most common causes of death, disability, and poor quality of life in postmenopausal women. The causes include heart disease, diseases of blood vessels, diabetes, depression, cancer, and bone loss (osteoporosis). Many things can be done to help lower the chances of developing these and other common problems: CARDIOVASCULAR DISEASE Heart Disease: A heart attack is a medical emergency. Know the signs and symptoms of a heart attack. Below are things women can do to reduce their risk for heart disease.   Do not smoke. If you smoke, quit.  Aim for a healthy weight. Being overweight causes many preventable deaths. Eat a healthy and balanced diet and drink an adequate amount of liquids.  Get moving. Make a commitment to be more physically active. Aim for 30 minutes of activity on most, if not all days of the week.  Eat for heart health. Choose a diet that is low in saturated fat and cholesterol and eliminate trans fat. Include whole grains, vegetables, and fruits. Read and understand the labels on food containers before buying.  Know your numbers. Ask your caregiver to check your blood pressure, cholesterol (total, HDL, LDL, triglycerides) and blood glucose. Work with your caregiver on improving your entire clinical picture.  High blood pressure. Limit or stop your table salt intake (try salt substitute and food seasonings). Avoid salty foods and drinks. Read labels on food containers before buying. Eating well and exercising can help control high blood pressure. STROKE  Stroke is a medical emergency. Stroke may be the result of a blood clot in a blood vessel in the brain or by a brain hemorrhage (bleeding). Know the signs and symptoms of a stroke. To lower the risk of developing a stroke:  Avoid fatty foods.  Quit smoking.  Control your diabetes, blood pressure, and irregular heart rate. THROMBOPHLEBITIS  (BLOOD CLOT) OF THE LEG  Becoming overweight and leading a stationary lifestyle may also contribute to developing blood clots. Controlling your diet and exercising will help lower the risk of developing blood clots. CANCER SCREENING  Breast Cancer: Take steps to reduce your risk of breast cancer.  You should practice "breast self-awareness." This means understanding the normal appearance and feel of your breasts and should include breast self-examination. Any changes detected, no matter how small, should be reported to your caregiver.  After age 40, you should have a clinical breast exam (CBE) every year.  Starting at age 40, you should consider having a mammogram (breast X-ray) every year.  If you have a family history of breast cancer, talk to your caregiver about genetic screening.  If you are at high risk for breast cancer, talk to your caregiver about having an MRI and a mammogram every year.  Intestinal or Stomach Cancer: Tests to consider are a rectal exam, fecal occult blood, sigmoidoscopy, and colonoscopy. Women who are high risk may need to be screened at an earlier age and more often.  Cervical Cancer:  Beginning at age 30, you should have a Pap test every 3 years as long as the past 3 Pap tests have been normal.  If you have had past treatment for cervical cancer or a condition that could lead to cancer, you need Pap tests and screening for cancer for at least 20 years after your treatment.  If you had a hysterectomy for a problem that was not cancer or a condition that could lead to cancer, then you no longer need Pap tests.    If you are between ages 65 and 70, and you have had normal Pap tests going back 10 years, you no longer need Pap tests.  If Pap tests have been discontinued, risk factors (such as a new sexual partner) need to be reassessed to determine if screening should be resumed.  Some medical problems can increase the chance of getting cervical cancer. In these  cases, your caregiver may recommend more frequent screening and Pap tests.  Uterine Cancer: If you have vaginal bleeding after reaching menopause, you should notify your caregiver.  Ovarian cancer: Other than yearly pelvic exams, there are no reliable tests available to screen for ovarian cancer at this time except for yearly pelvic exams.  Lung Cancer: Yearly chest X-rays can detect lung cancer and should be done on high risk women, such as cigarette smokers and women with chronic lung disease (emphysema).  Skin Cancer: A complete body skin exam should be done at your yearly examination. Avoid overexposure to the sun and ultraviolet light lamps. Use a strong sun block cream when in the sun. All of these things are important in lowering the risk of skin cancer. MENOPAUSE Menopause Symptoms: Hormone therapy products are effective for treating symptoms associated with menopause:  Moderate to severe hot flashes.  Night sweats.  Mood swings.  Headaches.  Tiredness.  Loss of sex drive.  Insomnia.  Other symptoms. Hormone replacement carries certain risks, especially in older women. Women who use or are thinking about using estrogen or estrogen with progestin treatments should discuss that with their caregiver. Your caregiver will help you understand the benefits and risks. The ideal dose of hormone replacement therapy is not known. The Food and Drug Administration (FDA) has concluded that hormone therapy should be used only at the lowest doses and for the shortest amount of time to reach treatment goals.  OSTEOPOROSIS Protecting Against Bone Loss and Preventing Fracture: If you use hormone therapy for prevention of bone loss (osteoporosis), the risks for bone loss must outweigh the risk of the therapy. Ask your caregiver about other medications known to be safe and effective for preventing bone loss and fractures. To guard against bone loss or fractures, the following is recommended:  If  you are less than age 50, take 1000 mg of calcium and at least 600 mg of Vitamin D per day.  If you are greater than age 50 but less than age 70, take 1200 mg of calcium and at least 600 mg of Vitamin D per day.  If you are greater than age 70, take 1200 mg of calcium and at least 800 mg of Vitamin D per day. Smoking and excessive alcohol intake increases the risk of osteoporosis. Eat foods rich in calcium and vitamin D and do weight bearing exercises several times a week as your caregiver suggests. DIABETES Diabetes Melitus: If you have Type I or Type 2 diabetes, you should keep your blood sugar under control with diet, exercise and recommended medication. Avoid too many sweets, starchy and fatty foods. Being overweight can make control more difficult. COGNITION AND MEMORY Cognition and Memory: Menopausal hormone therapy is not recommended for the prevention of cognitive disorders such as Alzheimer's disease or memory loss.  DEPRESSION  Depression may occur at any age, but is common in elderly women. The reasons may be because of physical, medical, social (loneliness), or financial problems and needs. If you are experiencing depression because of medical problems and control of symptoms, talk to your caregiver about this. Physical activity and   exercise may help with mood and sleep. Community and volunteer involvement may help your sense of value and worth. If you have depression and you feel that the problem is getting worse or becoming severe, talk to your caregiver about treatment options that are best for you. ACCIDENTS  Accidents are common and can be serious in the elderly woman. Prepare your house to prevent accidents. Eliminate throw rugs, place hand bars in the bath, shower and toilet areas. Avoid wearing high heeled shoes or walking on wet, snowy, and icy areas. Limit or stop driving if you have vision or hearing problems, or you feel you are unsteady with you movements and  reflexes. HEPATITIS C Hepatitis C is a type of viral infection affecting the liver. It is spread mainly through contact with blood from an infected person. It can be treated, but if left untreated, it can lead to severe liver damage over years. Many people who are infected do not know that the virus is in their blood. If you are a "baby-boomer", it is recommended that you have one screening test for Hepatitis C. IMMUNIZATIONS  Several immunizations are important to consider having during your senior years, including:   Tetanus, diptheria, and pertussis booster shot.  Influenza every year before the flu season begins.  Pneumonia vaccine.  Shingles vaccine.  Others as indicated based on your specific needs. Talk to your caregiver about these. Document Released: 05/16/2005 Document Revised: 03/10/2012 Document Reviewed: 01/10/2008 [employer] Patient Information 2014 [employer].  

## 2013-03-18 NOTE — Addendum Note (Signed)
Addended by: Richardson Chiquito on: 03/18/2013 02:43 PM   Modules accepted: Orders

## 2013-04-05 ENCOUNTER — Ambulatory Visit: Payer: Managed Care, Other (non HMO) | Admitting: Family Medicine

## 2013-04-05 VITALS — BP 90/58 | HR 69 | Temp 99.0°F | Resp 18 | Ht 58.5 in | Wt 139.0 lb

## 2013-04-05 DIAGNOSIS — R509 Fever, unspecified: Secondary | ICD-10-CM

## 2013-04-05 DIAGNOSIS — J0100 Acute maxillary sinusitis, unspecified: Secondary | ICD-10-CM

## 2013-04-05 LAB — POCT INFLUENZA A/B
Influenza A, POC: NEGATIVE
Influenza B, POC: NEGATIVE

## 2013-04-05 MED ORDER — IPRATROPIUM BROMIDE 0.03 % NA SOLN: 2.0000 | Freq: Four times a day (QID) | NASAL | Status: DC

## 2013-04-05 MED ORDER — LEVOFLOXACIN 500 MG PO TABS: 500.0000 mg | ORAL_TABLET | Freq: Every day | ORAL | Status: DC

## 2013-04-05 MED ORDER — PSEUDOEPHEDRINE HCL ER 120 MG PO TB12: 120.0000 mg | ORAL_TABLET | Freq: Two times a day (BID) | ORAL | Status: DC

## 2013-04-05 MED ORDER — FLUTICASONE PROPIONATE 50 MCG/ACT NA SUSP: 2.0000 | Freq: Every day | NASAL | Status: DC

## 2013-04-05 MED ORDER — METHYLPREDNISOLONE 4 MG PO KIT: PACK | ORAL | Status: DC

## 2013-04-05 NOTE — Patient Instructions (Addendum)
Hot showers or breathing in steam may help loosen the congestion.  Using a netti pot or sinus rinse is also likely to help you feel better and keep this from progressing.  Use the atrovent nasal spray as needed throughout the day and use the fluticasone nasal spray every night before bed for at least 2 weeks.  I recommend augmenting with 12 hr sudafed (behind the counter) and generic mucinex to help you move out the congestion.  If no improvement or you are getting worse, come back as you might need a different antibiotic but hopefully with all of the above, you can avoid it.   Sinusitis Sinusitis is redness, soreness, and swelling (inflammation) of the paranasal sinuses. Paranasal sinuses are air pockets within the bones of your face (beneath the eyes, the middle of the forehead, or above the eyes). In healthy paranasal sinuses, mucus is able to drain out, and air is able to circulate through them by way of your nose. However, when your paranasal sinuses are inflamed, mucus and air can become trapped. This can allow bacteria and other germs to grow and cause infection. Sinusitis can develop quickly and last only a short time (acute) or continue over a long period (chronic). Sinusitis that lasts for more than 12 weeks is considered chronic.  CAUSES  Causes of sinusitis include:  Allergies.  Structural abnormalities, such as displacement of the cartilage that separates your nostrils (deviated septum), which can decrease the air flow through your nose and sinuses and affect sinus drainage.  Functional abnormalities, such as when the small hairs (cilia) that line your sinuses and help remove mucus do not work properly or are not present. SYMPTOMS  Symptoms of acute and chronic sinusitis are the same. The primary symptoms are pain and pressure around the affected sinuses. Other symptoms include:  Upper toothache.  Earache.  Headache.  Bad breath.  Decreased sense of smell and taste.  A cough,  which worsens when you are lying flat.  Fatigue.  Fever.  Thick drainage from your nose, which often is green and may contain pus (purulent).  Swelling and warmth over the affected sinuses. DIAGNOSIS  Your caregiver will perform a physical exam. During the exam, your caregiver may:  Look in your nose for signs of abnormal growths in your nostrils (nasal polyps).  Tap over the affected sinus to check for signs of infection.  View the inside of your sinuses (endoscopy) with a special imaging device with a light attached (endoscope), which is inserted into your sinuses. If your caregiver suspects that you have chronic sinusitis, one or more of the following tests may be recommended:  Allergy tests.  Nasal culture A sample of mucus is taken from your nose and sent to a lab and screened for bacteria.  Nasal cytology A sample of mucus is taken from your nose and examined by your caregiver to determine if your sinusitis is related to an allergy. TREATMENT  Most cases of acute sinusitis are related to a viral infection and will resolve on their own within 10 days. Sometimes medicines are prescribed to help relieve symptoms (pain medicine, decongestants, nasal steroid sprays, or saline sprays).  However, for sinusitis related to a bacterial infection, your caregiver will prescribe antibiotic medicines. These are medicines that will help kill the bacteria causing the infection.  Rarely, sinusitis is caused by a fungal infection. In theses cases, your caregiver will prescribe antifungal medicine. For some cases of chronic sinusitis, surgery is needed. Generally, these are cases  in which sinusitis recurs more than 3 times per year, despite other treatments. HOME CARE INSTRUCTIONS   Drink plenty of water. Water helps thin the mucus so your sinuses can drain more easily.  Use a humidifier.  Inhale steam 3 to 4 times a day (for example, sit in the bathroom with the shower running).  Apply a  warm, moist washcloth to your face 3 to 4 times a day, or as directed by your caregiver.  Use saline nasal sprays to help moisten and clean your sinuses.  Take over-the-counter or prescription medicines for pain, discomfort, or fever only as directed by your caregiver. SEEK IMMEDIATE MEDICAL CARE IF:  You have increasing pain or severe headaches.  You have nausea, vomiting, or drowsiness.  You have swelling around your face.  You have vision problems.  You have a stiff neck.  You have difficulty breathing. MAKE SURE YOU:   Understand these instructions.  Will watch your condition.  Will get help right away if you are not doing well or get worse. Document Released: 03/24/2005 Document Revised: 06/16/2011 Document Reviewed: 04/08/2011 [facility] Patient Information 2014 [place], [place].

## 2013-04-05 NOTE — Progress Notes (Addendum)
Subjective:  This chart was scribed for Norberto Sorenson, MD by Quintella Reichert, ED scribe.  This patient was seen in [facility] Room 13 and the patient's care was started at 4:52 PM.   Patient ID: Jamie Patrick, female    DOB: Dec 06, 1955, 57 y.o.   MRN: [id number]  Chief Complaint  Patient presents with  . Cough    x2 days   . Chills    HPI   HPI Comments: Jamie Patrick is a 57 y.o. female who presents complaining of 2 days of persistent worsening cough with associated chills, fatigue, generalized body aches, congestion, rhinorrhea, headaches, sinus pressure, and mild sore throat.  Pt states that 3 nights ago she developed right knee pain and later that night began to feel very chilled with associated hot sweats.  The next morning she continued to feel unwell and developed her other above symptoms.  Cough is productive of green sputum. Pt also notes that for the last 2 weeks she has had a ringing "like a beep" in both ears.  She is eating and drinking normally.  She is sleeping a lot because she is taking Nyquil.  She has also been using Dayquil and Mucinex.  She has not taken any other medications pta.   Past Medical History  Diagnosis Date  . Anxiety and depression   . Hypothyroidism   . Allergic rhinitis   . CTS (carpal tunnel syndrome)     question of  . Vitamin D deficiency   . Osteopenia     as reported by pt, DEXA @ gyn  . Colon polyps     BENIGN  . Dysplasia of cervix, low grade (CIN 1)     h/o CIN-1/CIN2  . Prediabetes 09/01/2012    Current Outpatient Prescriptions on File Prior to Visit  Medication Sig Dispense Refill  . ALPRAZolam (XANAX) 0.5 MG tablet TAKE ONE TO TWO TABLETS BY MOUTH AT BEDTIME AS NEEDED FOR SLEEP  60 tablet  3  . Calcium Carbonate-Vitamin D (CALCIUM + D PO) Take by mouth.        . escitalopram (LEXAPRO) 20 MG tablet As directed  180 tablet  1  . fish oil-omega-3 fatty acids 1000 MG capsule Take 2 g by mouth daily.      Marland Kitchen levothyroxine (SYNTHROID,  LEVOTHROID) 50 MCG tablet Take 1 tablet (50 mcg total) by mouth daily.  90 tablet  1  . Multiple Vitamin (MULTIVITAMIN) tablet Take 1 tablet by mouth daily.      . fluconazole (DIFLUCAN) 150 MG tablet Take 1 tablet (150 mg total) by mouth once.  1 tablet  2   No current facility-administered medications on file prior to visit.    Allergies  Allergen Reactions  . Ceftriaxone Sodium     REACTION: nausea  . Cephalosporins Nausea And Vomiting  . Sulfonamide Derivatives     REACTION: near syncope, N\T\V      Review of Systems  Constitutional: Positive for fever (hot sweats and chills, temp not measured but felt feverish), chills, diaphoresis, activity change and fatigue. Negative for appetite change and unexpected weight change.  HENT: Positive for congestion, rhinorrhea, sinus pressure, sore throat and tinnitus. Negative for dental problem.   Respiratory: Positive for cough. Negative for chest tightness, shortness of breath and wheezing.   Cardiovascular: Negative for chest pain.  Musculoskeletal: Positive for arthralgias and myalgias. Negative for gait problem and joint swelling.  Neurological: Positive for headaches.  Psychiatric/Behavioral: Positive for sleep disturbance.  BP 90/58  Pulse 69  Temp(Src) 99 F (37.2 C) (Oral)  Resp 18  Ht 4' 10.5" (1.486 m)  Wt 139 lb (63.05 kg)  BMI 28.55 kg/m2  SpO2 100%  LMP 02/03/2009 Objective:   Physical Exam  Nursing note and vitals reviewed. Constitutional: She is oriented to person, place, and time. She appears well-developed and well-nourished. No distress.  HENT:  Head: Normocephalic and atraumatic.  Right Ear: Tympanic membrane is erythematous and retracted.  Left Ear: A middle ear effusion is present.  Nose: Mucosal edema present.  Mouth/Throat: Oropharynx is clear and moist. No oropharyngeal exudate, posterior oropharyngeal edema or posterior oropharyngeal erythema.  Eyes: EOM are normal.  Neck: Neck supple. No  tracheal deviation present. No mass and no thyromegaly present.  Cardiovascular: Normal rate, regular rhythm, S1 normal, S2 normal and normal heart sounds.  Exam reveals no gallop and no friction rub.   No murmur heard. Pulmonary/Chest: Effort normal. No respiratory distress. She has rales (Bibasilar rales that clear with deep breathing) in the right lower field and the left lower field.  Musculoskeletal: Normal range of motion.  Lymphadenopathy:       Head (right side): No submandibular, no tonsillar, no preauricular and no posterior auricular adenopathy present.       Head (left side): No submandibular, no tonsillar, no preauricular and no posterior auricular adenopathy present.    She has cervical adenopathy.       Right cervical: Superficial cervical adenopathy present. No deep cervical and no posterior cervical adenopathy present.      Left cervical: Superficial cervical adenopathy present. No deep cervical and no posterior cervical adenopathy present.       Right: No supraclavicular adenopathy present.       Left: No supraclavicular adenopathy present.  Neurological: She is alert and oriented to person, place, and time.  Skin: Skin is warm and dry.  Psychiatric: She has a normal mood and affect. Her behavior is normal.     Results for orders placed in visit on 04/05/13  POCT INFLUENZA A/B      Result Value Range   Influenza A, POC Negative     Influenza B, POC Negative         Assessment & Plan:  Fever, unspecified - Plan: POCT Influenza A/B  Sinusitis, acute maxillary  Meds ordered this encounter  Medications  . levofloxacin (LEVAQUIN) 500 MG tablet    Sig: Take 1 tablet (500 mg total) by mouth daily.    Dispense:  7 tablet    Refill:  0  . methylPREDNISolone (MEDROL, PAK,) 4 MG tablet    Sig: follow package directions    Dispense:  21 tablet    Refill:  0  . pseudoephedrine (SUDAFED 12 HOUR) 120 MG 12 hr tablet    Sig: Take 1 tablet (120 mg total) by mouth 2 (two)  times daily.    Dispense:  30 tablet    Refill:  0  . ipratropium (ATROVENT) 0.03 % nasal spray    Sig: Place 2 sprays into the nose 4 (four) times daily.    Dispense:  30 mL    Refill:  1  . fluticasone (FLONASE) 50 MCG/ACT nasal spray    Sig: Place 2 sprays into both nostrils at bedtime.    Dispense:  16 g    Refill:  2    I personally performed the services described in this documentation, which was scribed in my presence. The recorded information has been reviewed and  considered, and addended by me as needed.  Norberto SorensonEva Irene Mitcham, MD MPH

## 2013-04-08 ENCOUNTER — Telehealth: Payer: Self-pay

## 2013-04-08 NOTE — Telephone Encounter (Signed)
Agree. Thanks

## 2013-04-08 NOTE — Telephone Encounter (Signed)
She should d/c the sudafed at night. Also advised to take the prednisone all as one dose in the morning. They will let us know if not helpful.

## 2013-04-08 NOTE — Telephone Encounter (Signed)
Pt's husband Mliss Fritz states that pt is unable to sleep due to taking the medications prescribed her on Tuesday. Best# [phone]

## 2013-05-05 ENCOUNTER — Ambulatory Visit (INDEPENDENT_AMBULATORY_CARE_PROVIDER_SITE_OTHER): Payer: BC Managed Care – PPO | Admitting: Women's Health

## 2013-05-05 ENCOUNTER — Ambulatory Visit: Payer: Managed Care, Other (non HMO) | Admitting: Women's Health

## 2013-05-05 ENCOUNTER — Encounter: Payer: Self-pay | Admitting: Women's Health

## 2013-05-05 DIAGNOSIS — E039 Hypothyroidism, unspecified: Secondary | ICD-10-CM

## 2013-05-05 LAB — TSH: TSH: 2.022 u[IU]/mL (ref 0.350–4.500)

## 2013-05-05 MED ORDER — LEVOTHYROXINE SODIUM 50 MCG PO TABS: 50.0000 ug | ORAL_TABLET | Freq: Every day | ORAL | Status: DC

## 2013-05-05 NOTE — Progress Notes (Signed)
Patient ID: Jamie Patrick, female   DOB: 1955-07-29, 58 y.o.   MRN: [id number] Presents with questionable mole/bump on buttocks. States slightly tender to touch has been present for 2 weeks. Denies vaginal discharge, bleeding or urinary symptoms. Also requests TSH and refill on Synthroid. Upset over recently laid off from job.  Exam: Appears well. External genitalia within normal limits, on left outer perineum, 1 CM fluctuant folliculitis. with slight pressure exuded serosanguineous fluid, triple antibiotic ointment applied. 75% resolved.  Folliculitis Hypothyroid  Plan: TSH, Synthroid 50 mcg by mouth daily prescription, proper use given, triage based on TSH results. Has been on the same dose for years. Loose clothes, warm soaks, triple antibiotic ointment twice daily. Call if area does not completely resolve in one week.

## 2013-05-05 NOTE — Patient Instructions (Signed)
Folliculitis  Folliculitis is redness, soreness, and swelling (inflammation) of the hair follicles. This condition can occur anywhere on the body. People with weakened immune systems, diabetes, or obesity have a greater risk of getting folliculitis. CAUSES  Bacterial infection. This is the most common cause.  Fungal infection.  Viral infection.  Contact with certain chemicals, especially oils and tars. Long-term folliculitis can result from bacteria that live in the nostrils. The bacteria may trigger multiple outbreaks of folliculitis over time. SYMPTOMS Folliculitis most commonly occurs on the scalp, thighs, legs, back, buttocks, and areas where hair is shaved frequently. An early sign of folliculitis is a small, white or yellow, pus-filled, itchy lesion (pustule). These lesions appear on a red, inflamed follicle. They are usually less than 0.2 inches (5 mm) wide. When there is an infection of the follicle that goes deeper, it becomes a boil or furuncle. A group of closely packed boils creates a larger lesion (carbuncle). Carbuncles tend to occur in hairy, sweaty areas of the body. DIAGNOSIS  Your caregiver can usually tell what is wrong by doing a physical exam. A sample may be taken from one of the lesions and tested in a lab. This can help determine what is causing your folliculitis. TREATMENT  Treatment may include:  Applying warm compresses to the affected areas.  Taking antibiotic medicines orally or applying them to the skin.  Draining the lesions if they contain a large amount of pus or fluid.  Laser hair removal for cases of long-lasting folliculitis. This helps to prevent regrowth of the hair. HOME CARE INSTRUCTIONS  Apply warm compresses to the affected areas as directed by your caregiver.  If antibiotics are prescribed, take them as directed. Finish them even if you start to feel better.  You may take over-the-counter medicines to relieve itching.  Do not shave  irritated skin.  Follow up with your caregiver as directed. SEEK IMMEDIATE MEDICAL CARE IF:   You have increasing redness, swelling, or pain in the affected area.  You have a fever. MAKE SURE YOU:  Understand these instructions.  Will watch your condition.  Will get help right away if you are not doing well or get worse. Document Released: 06/02/2001 Document Revised: 09/23/2011 Document Reviewed: 06/24/2011 Exit[employer] Patient Information 2014 [employer].  

## 2013-05-06 ENCOUNTER — Other Ambulatory Visit: Payer: Self-pay | Admitting: Women's Health

## 2013-05-06 DIAGNOSIS — E039 Hypothyroidism, unspecified: Secondary | ICD-10-CM

## 2013-05-06 MED ORDER — LEVOTHYROXINE SODIUM 50 MCG PO TABS: 50.0000 ug | ORAL_TABLET | Freq: Every day | ORAL | Status: DC

## 2013-05-10 ENCOUNTER — Telehealth: Payer: Self-pay

## 2013-05-10 NOTE — Telephone Encounter (Signed)
Patient called and said that you checked a "mole" for her last Thursday.  It is still there and now another one beside it.  Should I recommend office visit to recheck new area?

## 2013-05-10 NOTE — Telephone Encounter (Signed)
Please call, instructed her to put some Neosporin or triple antibiotic ointment on a loose clothes it should resolve on its own. It is folliculitis. Office visit if it persists greater than a week. Also tell her they are taking applications for the new [place] on [address] near [facility], she was recently laid off from her job.Jamie Patrick

## 2013-05-10 NOTE — Telephone Encounter (Signed)
Left message to call.

## 2013-05-10 NOTE — Telephone Encounter (Signed)
Patient called. Informed. 

## 2013-05-13 ENCOUNTER — Encounter: Payer: Self-pay | Admitting: Internal Medicine

## 2013-05-16 ENCOUNTER — Telehealth: Payer: Self-pay

## 2013-05-16 NOTE — Telephone Encounter (Signed)
Received a note to you attached to her survey she emailed back.  "Dear Jamie Patrick, I've been applying Neosporin on the moles but I still have them. Is there anything else you can help me? Thank you, [name]"

## 2013-05-16 NOTE — Telephone Encounter (Signed)
I emailed patient back this response.

## 2013-05-16 NOTE — Telephone Encounter (Signed)
Please call, FYI - the "moles" are small areas of folliculitis, continue warm soaks, thoroughly drying and applying neosporin.  Wear loose clothes.  Ask her if any redness around the areas, any drainage?

## 2013-06-10 ENCOUNTER — Telehealth: Payer: Self-pay | Admitting: *Deleted

## 2013-06-10 ENCOUNTER — Other Ambulatory Visit: Payer: Self-pay | Admitting: Internal Medicine

## 2013-06-10 MED ORDER — ALPRAZOLAM 0.5 MG PO TABS: ORAL_TABLET | ORAL | Status: DC

## 2013-06-10 NOTE — Telephone Encounter (Signed)
rx refill- XANAX 0.5mg  Last OV- 02/02/13 Last refilled- 11/02/12 #60 / 3 rf  UDS- 11/02/12 LOW risk

## 2013-06-10 NOTE — Telephone Encounter (Signed)
rx faxed to [facility] [address]

## 2013-06-10 NOTE — Telephone Encounter (Signed)
done

## 2013-06-10 NOTE — Addendum Note (Signed)
Addended by: Kathlene November E on: 06/10/2013 12:18 PM   Modules accepted: Orders

## 2013-08-04 ENCOUNTER — Encounter: Payer: Self-pay | Admitting: Internal Medicine

## 2013-08-24 ENCOUNTER — Encounter: Payer: Self-pay | Admitting: Internal Medicine

## 2013-08-24 ENCOUNTER — Ambulatory Visit (INDEPENDENT_AMBULATORY_CARE_PROVIDER_SITE_OTHER): Payer: BC Managed Care – PPO | Admitting: Internal Medicine

## 2013-08-24 VITALS — BP 116/64 | HR 77 | Temp 98.5°F | Wt 143.0 lb

## 2013-08-24 DIAGNOSIS — E039 Hypothyroidism, unspecified: Secondary | ICD-10-CM

## 2013-08-24 DIAGNOSIS — F341 Dysthymic disorder: Secondary | ICD-10-CM

## 2013-08-24 DIAGNOSIS — F329 Major depressive disorder, single episode, unspecified: Secondary | ICD-10-CM

## 2013-08-24 DIAGNOSIS — F32A Depression, unspecified: Secondary | ICD-10-CM

## 2013-08-24 DIAGNOSIS — F419 Anxiety disorder, unspecified: Secondary | ICD-10-CM

## 2013-08-24 MED ORDER — TRAZODONE HCL 50 MG PO TABS: 50.0000 mg | ORAL_TABLET | Freq: Every evening | ORAL | Status: DC | PRN

## 2013-08-24 MED ORDER — THYROID 30 MG PO TABS: 30.0000 mg | ORAL_TABLET | Freq: Every day | ORAL | Status: DC

## 2013-08-24 NOTE — Progress Notes (Signed)
   Subjective:    Patient ID: Jamie Patrick, female    DOB: Aug 02, 1955, 58 y.o.   MRN: [id number]  DOS:  08/24/2013 Type of  visit: Here to discuss the following Currently not working permanently, her self-esteem is low, slightly more anxious than before but not depressed. Has been unable to sleep well for a while, even with Xanax, request a change in her medication. Also, would like to try armour  Thyroid Instead of Synthroid  ROS No suicidal. She is trying to remain active  Past Medical History  Diagnosis Date  . Anxiety and depression   . Hypothyroidism   . Allergic rhinitis   . CTS (carpal tunnel syndrome)     question of  . Vitamin D deficiency   . Osteopenia     as reported by pt, DEXA @ gyn  . Colon polyps     BENIGN  . Dysplasia of cervix, low grade (CIN 1)     h/o CIN-1/CIN2  . Prediabetes 09/01/2012    Past Surgical History  Procedure Laterality Date  . Cholecystectomy  2010  . Tonsillectomy    . Uterine polyps  2003    Dr.Fernadez/ RESECTOSCOPIC POLYPECTOMY  . Cryotherapy      FOR CERVICAL DYSPLASIA CIN-1/CIN-2  . Colonoscopy      repeat in 5 years  . Appendectomy      History   Social History  . Marital Status: Married    Spouse Name: N/A    Number of Children: 0  . Years of Education: N/A   Occupational History  . unemployed since ~12-2012   .     Social History Main Topics  . Smoking status: Never Smoker   . Smokeless tobacco: Never Used  . Alcohol Use: No     Comment:    . Drug Use: No  . Sexual Activity: No   Other Topics Concern  . Not on file   Social History Narrative   Original from [place]-          Medication List       This list is accurate as of: 08/24/13  7:51 PM.  Always use your most recent med list.               CALCIUM + D PO  Take by mouth.     escitalopram 20 MG tablet  Commonly known as:  LEXAPRO  As directed     fish oil-omega-3 fatty acids 1000 MG capsule  Take 2 g by mouth daily.     multivitamin  tablet  Take 1 tablet by mouth daily.     thyroid 30 MG tablet  Commonly known as:  ARMOUR THYROID  Take 1 tablet (30 mg total) by mouth daily before breakfast.     traZODone 50 MG tablet  Commonly known as:  DESYREL  Take 1 tablet (50 mg total) by mouth at bedtime as needed for sleep.           Objective:   Physical Exam BP 116/64  Pulse 77  Temp(Src) 98.5 F (36.9 C) (Oral)  Wt 143 lb (64.864 kg)  SpO2 96%  LMP 02/03/2009 General -- alert, well-developed, NAD.  Neurologic--  alert & oriented X3.   Psych-- Cognition and judgment appear intact. Cooperative with normal attention span and concentration. slt  anxious but no depressed appearing.        Assessment & Plan:

## 2013-08-24 NOTE — Progress Notes (Signed)
Pre-visit discussion using our clinic review tool. No additional management support is needed unless otherwise documented below in the visit note.  

## 2013-08-24 NOTE — Assessment & Plan Note (Signed)
Likes to try Armour thyroid instead of Synthroid, will start with 30 mg daily, follow up in few weeks to recheck a TSH

## 2013-08-24 NOTE — Patient Instructions (Signed)
Stop Xanax and Synthroid Start taking medications as prescribed Schedule a visit for a complete physical exam in 6-8 weeks

## 2013-08-24 NOTE — Assessment & Plan Note (Addendum)
More anxious lately, Has lost 2 jobs in the last year. Reports she saw a neurologist regards problems w/  memory but she got good reports. She tried to apply for disability (memory issues, anxiety) but that did not go through. Not sleeping well, request to change Xanax for insomnia.Thinks tried [place]  in the past but it did not agreed with her. Plan: Discontinue Xanax, try trazodone. reasses anxiety and depression once she is able to sleep

## 2013-08-30 ENCOUNTER — Encounter: Payer: Self-pay | Admitting: Internal Medicine

## 2013-08-31 ENCOUNTER — Other Ambulatory Visit: Payer: Self-pay | Admitting: Internal Medicine

## 2013-08-31 MED ORDER — SUVOREXANT 10 MG PO TABS: 1.0000 | ORAL_TABLET | Freq: Every evening | ORAL | Status: DC | PRN

## 2013-09-02 NOTE — Telephone Encounter (Signed)
Patient called stating that she still has not received a rx for Belsomra. Please advise

## 2013-09-07 ENCOUNTER — Telehealth: Payer: Self-pay | Admitting: *Deleted

## 2013-09-07 NOTE — Telephone Encounter (Signed)
Prior authorization paperwork for [place] faxed. Awaiting response. JG//CMA

## 2013-09-09 NOTE — Telephone Encounter (Signed)
Belsomra approved from 09/07/2013 through 04/06/2038. Reference #: [id number]. Approval letter sent for scanning. JG//CMA

## 2013-09-21 ENCOUNTER — Encounter: Payer: Self-pay | Admitting: Women's Health

## 2013-09-22 ENCOUNTER — Other Ambulatory Visit: Payer: Self-pay | Admitting: Women's Health

## 2013-09-22 DIAGNOSIS — R1032 Left lower quadrant pain: Secondary | ICD-10-CM

## 2013-09-28 ENCOUNTER — Encounter: Payer: Self-pay | Admitting: Internal Medicine

## 2013-09-29 ENCOUNTER — Ambulatory Visit (INDEPENDENT_AMBULATORY_CARE_PROVIDER_SITE_OTHER): Payer: BC Managed Care – PPO | Admitting: Internal Medicine

## 2013-09-29 ENCOUNTER — Encounter: Payer: Self-pay | Admitting: Internal Medicine

## 2013-09-29 VITALS — BP 101/65 | HR 73 | Temp 98.1°F | Wt 141.0 lb

## 2013-09-29 DIAGNOSIS — R1030 Lower abdominal pain, unspecified: Secondary | ICD-10-CM

## 2013-09-29 DIAGNOSIS — R109 Unspecified abdominal pain: Secondary | ICD-10-CM

## 2013-09-29 DIAGNOSIS — E039 Hypothyroidism, unspecified: Secondary | ICD-10-CM

## 2013-09-29 LAB — CBC WITH DIFFERENTIAL/PLATELET
Basophils Absolute: 0 10*3/uL (ref 0.0–0.1)
Basophils Relative: 0.8 % (ref 0.0–3.0)
EOS ABS: 0.1 10*3/uL (ref 0.0–0.7)
Eosinophils Relative: 1.7 % (ref 0.0–5.0)
HCT: 36.2 % (ref 36.0–46.0)
Hemoglobin: 12.3 g/dL (ref 12.0–15.0)
LYMPHS PCT: 48.6 % — AB (ref 12.0–46.0)
Lymphs Abs: 2.6 10*3/uL (ref 0.7–4.0)
MCHC: 34 g/dL (ref 30.0–36.0)
MCV: 93.5 fl (ref 78.0–100.0)
MONO ABS: 0.3 10*3/uL (ref 0.1–1.0)
Monocytes Relative: 6.3 % (ref 3.0–12.0)
NEUTROS PCT: 42.6 % — AB (ref 43.0–77.0)
Neutro Abs: 2.3 10*3/uL (ref 1.4–7.7)
PLATELETS: 203 10*3/uL (ref 150.0–400.0)
RBC: 3.87 Mil/uL (ref 3.87–5.11)
RDW: 15 % (ref 11.5–15.5)
WBC: 5.3 10*3/uL (ref 4.0–10.5)

## 2013-09-29 LAB — COMPREHENSIVE METABOLIC PANEL
ALBUMIN: 4.2 g/dL (ref 3.5–5.2)
ALT: 31 U/L (ref 0–35)
AST: 28 U/L (ref 0–37)
Alkaline Phosphatase: 58 U/L (ref 39–117)
BUN: 8 mg/dL (ref 6–23)
CO2: 30 mEq/L (ref 19–32)
Calcium: 9.1 mg/dL (ref 8.4–10.5)
Chloride: 102 mEq/L (ref 96–112)
Creatinine, Ser: 0.6 mg/dL (ref 0.4–1.2)
GFR: 101.7 mL/min (ref >60.00)
Glucose, Bld: 83 mg/dL (ref 70–99)
POTASSIUM: 4.2 meq/L (ref 3.5–5.1)
SODIUM: 137 meq/L (ref 135–145)
TOTAL PROTEIN: 7.6 g/dL (ref 6.0–8.3)
Total Bilirubin: 0.6 mg/dL (ref 0.2–1.2)

## 2013-09-29 LAB — POCT URINALYSIS DIPSTICK
Bilirubin, UA: NEGATIVE
GLUCOSE UA: NEGATIVE
KETONES UA: NEGATIVE
Leukocytes, UA: NEGATIVE
NITRITE UA: NEGATIVE
Spec Grav, UA: <=1.005
Urobilinogen, UA: 0.2
pH, UA: 5

## 2013-09-29 LAB — URINALYSIS, ROUTINE W REFLEX MICROSCOPIC
Bilirubin Urine: NEGATIVE
Ketones, ur: NEGATIVE
LEUKOCYTES UA: NEGATIVE
Nitrite: NEGATIVE
PH: 7 (ref 5.0–8.0)
Specific Gravity, Urine: <=1.005 — AB (ref 1.000–1.030)
Total Protein, Urine: NEGATIVE
UROBILINOGEN UA: 0.2 (ref 0.0–1.0)
Urine Glucose: NEGATIVE

## 2013-09-29 LAB — TSH: TSH: 3.52 u[IU]/mL (ref 0.35–4.50)

## 2013-09-29 MED ORDER — CIPROFLOXACIN HCL 500 MG PO TABS: 500.0000 mg | ORAL_TABLET | Freq: Two times a day (BID) | ORAL | Status: DC

## 2013-09-29 NOTE — Patient Instructions (Signed)
Get your blood work before you leave   Take ciprofloxacin for one week [place] diet, drink plenty of fluids If you have worsening stomach pain, fever, chills, diarrhea or not improving in the next few days: call the office   You are due for a physical exam,  please schedule an appointment.

## 2013-09-29 NOTE — Progress Notes (Signed)
Pre visit review using our clinic review tool, if applicable. No additional management support is needed unless otherwise documented below in the visit note. 

## 2013-09-29 NOTE — Assessment & Plan Note (Signed)
Check a TSH 

## 2013-09-29 NOTE — Progress Notes (Signed)
Subjective:    Patient ID: Jamie Patrick, female    DOB: [birth year], 58 y.o.   MRN: [id number]  DOS:  09/29/2013 Type of  Visit: acute  History: 3 day history of mid lower abdominal pain, pain is a steady, " colicky, pressure", no radiation. Decrease with ibuprofen and Pepto-Bismol, unclear what make it worse but she is doing a bland diet just in case.  Also, 3 weeks h/o  left lower quadrant abdominal pain, she think is a "ovary  problem" already called gyn, u/s pending    ROS  Denies fever chills No  nausea, vomiting, diarrhea or blood in the stools. Her last stool was dark green. No GERD symptoms. No dysuria, gross hematuria or difficulty urinating. No vaginal discharge or bleeding. No back pain.   Past Medical History  Diagnosis Date  . Anxiety and depression   . Hypothyroidism   . Allergic rhinitis   . CTS (carpal tunnel syndrome)     question of  . Vitamin D deficiency   . Osteopenia     as reported by pt, DEXA @ gyn  . Colon polyps     BENIGN  . Dysplasia of cervix, low grade (CIN 1)     h/o CIN-1/CIN2  . Prediabetes 09/01/2012    Past Surgical History  Procedure Laterality Date  . Cholecystectomy  2010  . Tonsillectomy    . Uterine polyps  2003    Dr.Fernadez/ RESECTOSCOPIC POLYPECTOMY  . Cryotherapy      FOR CERVICAL DYSPLASIA CIN-1/CIN-2  . Colonoscopy      repeat in 5 years  . Appendectomy      History   Social History  . Marital Status: Married    Spouse Name: N/A    Number of Children: 0  . Years of Education: N/A   Occupational History  . unemployed since ~12-2012   .     Social History Main Topics  . Smoking status: Never Smoker   . Smokeless tobacco: Never Used  . Alcohol Use: No     Comment:    . Drug Use: No  . Sexual Activity: No   Other Topics Concern  . Not on file   Social History Narrative   Original from [place]-          Medication List       This list is accurate as of: 09/29/13  3:58 PM.  Always use your most  recent med list.               ALPRAZolam 0.5 MG tablet  Commonly known as:  XANAX     CALCIUM + D PO  Take by mouth.     ciprofloxacin 500 MG tablet  Commonly known as:  CIPRO  Take 1 tablet (500 mg total) by mouth 2 (two) times daily.     escitalopram 20 MG tablet  Commonly known as:  LEXAPRO  As directed     fish oil-omega-3 fatty acids 1000 MG capsule  Take 2 g by mouth daily.     multivitamin tablet  Take 1 tablet by mouth daily.     Suvorexant 10 MG Tabs  Commonly known as:  BELSOMRA  Take 1 tablet by mouth at bedtime as needed (for difficulty sleeping).     thyroid 30 MG tablet  Commonly known as:  ARMOUR THYROID  Take 1 tablet (30 mg total) by mouth daily before breakfast.           Objective:  Physical Exam BP 101/65  Pulse 73  Temp(Src) 98.1 F (36.7 C)  Wt 141 lb (63.957 kg)  SpO2 99%  LMP 02/03/2009 General -- alert, well-developed, NAD.  Lungs -- normal respiratory effort, no intercostal retractions, no accessory muscle use, and normal breath sounds.  Heart-- normal rate, regular rhythm, no murmur.  Abdomen-- Not distended, good bowel sounds,soft, Initially quite tender through out, after the patient relaxed, she was a slightly tender mostly in the upper abdomen, minimal tenderness to deep palpation  at the R or L lower quadrants  .No rebound or rigidity. No mass,organomegaly. Rectal-- No external abnormalities noted. Normal sphincter tone. No rectal masses or tenderness. Greenish stools, Hemoccult negative   Extremities-- no pretibial edema bilaterally  Neurologic--  alert & oriented X3. Speech normal, gait appropriate for age, strength symmetric and appropriate for age.    Psych-- Cognition and judgment appear intact. Cooperative with normal attention span and concentration. No anxious or depressed appearing.        Assessment & Plan:  Abdominal pain, 58 year old female with history of appendectomy and cholecystectomy presents with  abdominal pain, for 3 weeks at the left lower quadrant, x 3 days at mid abdomen. Physical exam not consistent with an acute abdomen. Denies diarrhea but some change in the color of the stools. Urinalysis showed traces of blood. Hemoccult negative. DDX Gastroenteritis, diverticulitis, UTI, kidney stones, other  Plan: Urine culture, CBC, CMP. Empiric Cipro, UTI? Prompt reevaluation if not better. (CT etc) Patient in agreement

## 2013-10-01 LAB — URINE CULTURE
COLONY COUNT: NO GROWTH
Organism ID, Bacteria: NO GROWTH

## 2013-10-04 ENCOUNTER — Encounter: Payer: Self-pay | Admitting: Internal Medicine

## 2013-10-05 ENCOUNTER — Other Ambulatory Visit: Payer: Self-pay | Admitting: Internal Medicine

## 2013-10-05 ENCOUNTER — Ambulatory Visit: Payer: BC Managed Care – PPO | Admitting: Women's Health

## 2013-10-05 ENCOUNTER — Encounter: Payer: Self-pay | Admitting: Internal Medicine

## 2013-10-05 ENCOUNTER — Other Ambulatory Visit: Payer: BC Managed Care – PPO

## 2013-10-05 DIAGNOSIS — R1032 Left lower quadrant pain: Secondary | ICD-10-CM

## 2013-10-05 DIAGNOSIS — R109 Unspecified abdominal pain: Secondary | ICD-10-CM

## 2013-10-10 ENCOUNTER — Ambulatory Visit
Admission: RE | Admit: 2013-10-10 | Discharge: 2013-10-10 | Disposition: A | Discharge status: Home / Self Care | Payer: BC Managed Care – PPO | Source: Ambulatory Visit | Attending: Internal Medicine | Admitting: Internal Medicine

## 2013-10-10 DIAGNOSIS — R109 Unspecified abdominal pain: Secondary | ICD-10-CM

## 2013-10-10 MED ORDER — IOHEXOL 300 MG/ML  SOLN
100.0000 mL | Freq: Once | INTRAMUSCULAR | Status: AC | PRN
  Administered 2013-10-10: 100 mL via INTRAVENOUS

## 2013-10-11 ENCOUNTER — Telehealth: Payer: Self-pay | Admitting: *Deleted

## 2013-10-11 ENCOUNTER — Encounter: Payer: Self-pay | Admitting: Women's Health

## 2013-10-11 NOTE — Telephone Encounter (Signed)
Left message on voice mail for the patient to return my call regarding CT results.

## 2013-10-11 NOTE — Telephone Encounter (Signed)
Message copied by Chilton Greathouse on Tue Oct 11, 2013  8:42 AM ------      Message from: Kathlene November E      Created: Mon Oct 10, 2013  5:06 PM       Advise patient, CT is normal. Recommend to see her gynecologist regards the lower abdominal pain ------

## 2013-10-11 NOTE — Telephone Encounter (Signed)
Spoke with patient and made aware of CT results.

## 2013-10-13 ENCOUNTER — Encounter: Payer: Self-pay | Admitting: Women's Health

## 2013-10-24 ENCOUNTER — Other Ambulatory Visit: Payer: Self-pay | Admitting: Internal Medicine

## 2013-10-27 ENCOUNTER — Ambulatory Visit: Payer: BC Managed Care – PPO | Admitting: Women's Health

## 2013-11-08 ENCOUNTER — Encounter: Payer: Self-pay | Admitting: Internal Medicine

## 2013-11-11 ENCOUNTER — Ambulatory Visit: Payer: BC Managed Care – PPO | Admitting: Women's Health

## 2013-11-18 ENCOUNTER — Ambulatory Visit (INDEPENDENT_AMBULATORY_CARE_PROVIDER_SITE_OTHER): Payer: 59 | Admitting: Internal Medicine

## 2013-11-18 ENCOUNTER — Encounter: Payer: Self-pay | Admitting: Internal Medicine

## 2013-11-18 VITALS — BP 104/61 | HR 74 | Temp 98.1°F | Ht 59.0 in | Wt 141.4 lb

## 2013-11-18 DIAGNOSIS — F32A Depression, unspecified: Secondary | ICD-10-CM

## 2013-11-18 DIAGNOSIS — F329 Major depressive disorder, single episode, unspecified: Secondary | ICD-10-CM

## 2013-11-18 DIAGNOSIS — F419 Anxiety disorder, unspecified: Secondary | ICD-10-CM

## 2013-11-18 DIAGNOSIS — E039 Hypothyroidism, unspecified: Secondary | ICD-10-CM

## 2013-11-18 DIAGNOSIS — Z0000 Encounter for general adult medical examination without abnormal findings: Secondary | ICD-10-CM

## 2013-11-18 DIAGNOSIS — G4700 Insomnia, unspecified: Secondary | ICD-10-CM

## 2013-11-18 LAB — LIPID PANEL
CHOL/HDL RATIO: 5
CHOLESTEROL: 197 mg/dL (ref 0–200)
HDL: 39.2 mg/dL (ref >39.00)
LDL CALC: 138 mg/dL — AB (ref 0–99)
NONHDL: 157.8
Triglycerides: 97 mg/dL (ref 0.0–149.0)
VLDL: 19.4 mg/dL (ref 0.0–40.0)

## 2013-11-18 LAB — TSH: TSH: 1.56 u[IU]/mL (ref 0.35–4.50)

## 2013-11-18 NOTE — Progress Notes (Signed)
Subjective:    Patient ID: Jamie Patrick, female    DOB: December 05, 1955, 58 y.o.   MRN: [id number]  DOS:  11/18/2013 Type of visit - description: CPX History: We also discussed other issues , see a/p  ROS  No fever, chills  No  CP, SOB  Denies  nausea, vomiting diarrhea, blood in the stools No abdominal pain (-) cough, sputum production (-) wheezing, chest congestion No dysuria, gross hematuria, difficulty urinating  No headaches. Denies dizziness    Past Medical History  Diagnosis Date  . Anxiety and depression   . Hypothyroidism   . Allergic rhinitis   . CTS (carpal tunnel syndrome)     question of  . Vitamin D deficiency   . Osteopenia     as reported by pt, DEXA @ gyn  . Colon polyps     BENIGN  . Dysplasia of cervix, low grade (CIN 1)     h/o CIN-1/CIN2  . Prediabetes 09/01/2012    Past Surgical History  Procedure Laterality Date  . Cholecystectomy  2010  . Tonsillectomy    . Uterine polyps  2003    Dr.Fernadez/ RESECTOSCOPIC POLYPECTOMY  . Cryotherapy      FOR CERVICAL DYSPLASIA CIN-1/CIN-2  . Colonoscopy      repeat in 5 years  . Appendectomy      History   Social History  . Marital Status: Married    Spouse Name: N/A    Number of Children: 0  . Years of Education: N/A   Occupational History  . 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    [employer]     Social History Main Topics  . Smoking status: Never Smoker   . Smokeless tobacco: Never Used  . Alcohol Use: No     Comment:    . Drug Use: No  . Sexual Activity: No   Other Topics Concern  . Not on file   Social History Narrative   Original from [place]-       Family History  Problem Relation Age of Onset  . Diabetes Brother     uncles, GM  . Hypertension Brother   . Heart attack Neg Hx   . Colon cancer Neg Hx   . Breast cancer Other     distant cousins  . Osteoporosis Sister     2 sisters        Medication List       This list is accurate as of: 11/18/13 11:59 PM.  Always use your most  recent med list.               ALPRAZolam 0.5 MG tablet  Commonly known as:  XANAX  Take 0.5 mg by mouth at bedtime as needed for anxiety or sleep.     CALCIUM + D PO  Take by mouth.     escitalopram 20 MG tablet  Commonly known as:  LEXAPRO  1.5 tabs a day     fish oil-omega-3 fatty acids 1000 MG capsule  Take 2 g by mouth daily.     multivitamin tablet  Take 1 tablet by mouth daily.     thyroid 30 MG tablet  Commonly known as:  ARMOUR THYROID  Take 1 tablet (30 mg total) by mouth daily before breakfast.           Objective:   Physical Exam BP 104/61  Pulse 74  Temp(Src) 98.1 F (36.7 C) (Oral)  Ht 4\' 11"  (1.499  m)  Wt 141 lb 6 oz (64.127 kg)  BMI 28.54 kg/m2  SpO2 95%  LMP 02/03/2009 General -- alert, well-developed, NAD.  Neck --no thyromegaly  HEENT-- Not pale.  Lungs -- normal respiratory effort, no intercostal retractions, no accessory muscle use, and normal breath sounds.  Heart-- normal rate, regular rhythm, no murmur.  Abdomen-- Not distended, good bowel sounds,soft, non-tender. Extremities-- no pretibial edema bilaterally  Neurologic--  alert & oriented X3. Speech normal, gait appropriate for age, strength symmetric and appropriate for age.   Psych-- Cognition and judgment appear intact. Cooperative with normal attention span and concentration. No anxious or depressed appearing.         Assessment & Plan:

## 2013-11-18 NOTE — Assessment & Plan Note (Addendum)
On Lexapro 30 mg daily and Xanax at night, got a new job ~2 weeks ago, already feeling stressed. Again mentioned "memory problems". Plan: Continue with present care, strongly encouraged her to see a counselor

## 2013-11-18 NOTE — Assessment & Plan Note (Addendum)
Was switched to Armour Thyroid, initially she felt "great" now is doing about the same as before. Plan: Continue with present care, check a TSH

## 2013-11-18 NOTE — Assessment & Plan Note (Signed)
on Xanax at night, rf prn. UDS today

## 2013-11-18 NOTE — Patient Instructions (Signed)
Get your blood work before you leave  Also needs a UDS   Next visit is for routine check up regards your thyroid in 4 months  No need to come back fasting Please make an appointment

## 2013-11-18 NOTE — Assessment & Plan Note (Signed)
Td 2007   zostavax-- pt's husband immunosuppressed, unable to take  Female care per gyn 11-2012 [facility] (-) Cscope Dr Collene Mares, aprox 2006 and again 04-2010, next in 5 years per report   counseled ref diet - exercise  Labs

## 2013-11-18 NOTE — Progress Notes (Signed)
Pre-visit discussion using our clinic review tool. No additional management support is needed unless otherwise documented below in the visit note.  

## 2013-11-24 ENCOUNTER — Encounter: Payer: Self-pay | Admitting: Internal Medicine

## 2013-11-25 ENCOUNTER — Telehealth: Payer: Self-pay

## 2013-11-25 MED ORDER — THYROID 30 MG PO TABS: 30.0000 mg | ORAL_TABLET | Freq: Every day | ORAL | Status: DC

## 2013-11-25 NOTE — Telephone Encounter (Signed)
UDS: 11/18/2013  Negative for Xanax Positive for Citalopram   Moderate Risk per Dr. Larose Kells (11/25/2013).

## 2013-11-28 ENCOUNTER — Other Ambulatory Visit: Payer: Self-pay

## 2013-11-28 MED ORDER — THYROID 30 MG PO TABS: 30.0000 mg | ORAL_TABLET | Freq: Every day | ORAL | Status: DC

## 2013-12-14 ENCOUNTER — Encounter: Payer: Self-pay | Admitting: Internal Medicine

## 2013-12-15 ENCOUNTER — Encounter: Payer: Self-pay | Admitting: Internal Medicine

## 2013-12-19 ENCOUNTER — Other Ambulatory Visit: Payer: Self-pay | Admitting: Internal Medicine

## 2013-12-20 ENCOUNTER — Telehealth: Payer: Self-pay

## 2013-12-20 NOTE — Telephone Encounter (Signed)
Pt is requesting refill for Alprazolam.  Last OV: 11/18/2013 Last Fill: 06/10/2013 UDS: 11/18/2013 Moderate risk   Please advise.

## 2013-12-20 NOTE — Telephone Encounter (Signed)
Pt is requesting refill for Alprazolam.   Last OV: 11/18/2013  Last Fill: 06/10/2013  UDS: 11/18/2013 Moderate risk    Please advise.

## 2013-12-21 NOTE — Telephone Encounter (Signed)
Agree, she has enough medication however if she needs i  to change pharmacy, okay to call 90 days and one refill to the new pharmacy

## 2013-12-21 NOTE — Telephone Encounter (Signed)
Caller name: Meloni  Relation to pt: self  Call back number: [phone] Pharmacy: [facility] [phone]  Reason for call: pt requesting a refill thyroid (ARMOUR THYROID) 30 MG tablet please send to new pharmacy

## 2013-12-21 NOTE — Telephone Encounter (Signed)
Armour Thyroid was refilled on 11/28/2013 with 90 tablets (3 month supply) and 1 refill to [facility]. She should have had 6 months worth of medication.

## 2013-12-22 ENCOUNTER — Telehealth: Payer: Self-pay

## 2013-12-22 ENCOUNTER — Encounter: Payer: Self-pay | Admitting: Internal Medicine

## 2013-12-22 NOTE — Telephone Encounter (Signed)
Pt checking status of armour thyroid as well

## 2013-12-22 NOTE — Telephone Encounter (Signed)
[facility]

## 2013-12-22 NOTE — Telephone Encounter (Signed)
LMOM for Pt to return call, Armour Thyroid medication was sent to [facility] in [place] on 11/28/2013 # 59 with 1 refill. Pt was given 6 month supply, she should not need thyroid medication if she is taking it once daily.

## 2013-12-22 NOTE — Telephone Encounter (Signed)
Pt is requesting refill for Alprazolam.    Last OV: 11/18/2013  Last Fill: 06/10/2013  UDS: 11/18/2013 Moderate risk    Please advise.

## 2013-12-23 ENCOUNTER — Other Ambulatory Visit: Payer: Self-pay

## 2013-12-23 MED ORDER — THYROID 30 MG PO TABS: 30.0000 mg | ORAL_TABLET | Freq: Every day | ORAL | Status: DC

## 2013-12-23 MED ORDER — ALPRAZOLAM 0.5 MG PO TABS: 0.5000 mg | ORAL_TABLET | Freq: Every evening | ORAL | Status: DC | PRN

## 2013-12-23 NOTE — Telephone Encounter (Signed)
Caller name: Sieara Relation to pt: self  Call back number: [phone]  Reason for call:    Pt is requesting a refill of ALPRAZolam (XANAX) 0.5 MG tablet.  Pt would like to discuss

## 2013-12-23 NOTE — Telephone Encounter (Signed)
Xanax refilled and faxed to [facility].

## 2013-12-23 NOTE — Telephone Encounter (Signed)
Armour Thyroid sent to [facility]. Awaiting authorization on alprazolam refill.

## 2013-12-23 NOTE — Addendum Note (Signed)
Addended by: Kathlene November E on: 12/23/2013 11:13 AM   Modules accepted: Orders

## 2013-12-23 NOTE — Telephone Encounter (Signed)
Xanax #30, 1 RF UDS on RTC 03-2014

## 2013-12-23 NOTE — Telephone Encounter (Signed)
LMOM that we have request for Alprazolam, awaiting Dr. Ethel Rana authorization, it will be faxed to Pharmacy as soon as I receive prescription.

## 2014-01-01 ENCOUNTER — Ambulatory Visit (INDEPENDENT_AMBULATORY_CARE_PROVIDER_SITE_OTHER): Payer: 59 | Admitting: Internal Medicine

## 2014-01-01 VITALS — BP 108/60 | HR 64 | Temp 98.1°F | Resp 16 | Ht <= 58 in | Wt 142.4 lb

## 2014-01-01 DIAGNOSIS — J019 Acute sinusitis, unspecified: Secondary | ICD-10-CM

## 2014-01-01 MED ORDER — AMOXICILLIN 875 MG PO TABS
875.0000 mg | ORAL_TABLET | Freq: Two times a day (BID) | ORAL | Status: DC
Start: 2014-01-01 — End: 2014-02-03

## 2014-01-01 NOTE — Progress Notes (Signed)
Subjective:   This chart was scribed for Tami Lin, MD by Erling Conte, Medical Scribe. This patient was seen in Room 10 and the patient's care was started at 2:01 PM.    Patient ID: Jamie Patrick, female    DOB: 09/30/1955, 58 y.o.   MRN: [id number]  Chief Complaint  Patient presents with  . Cough    Strated on Tues.  . Chills  . Sore Throat  . Otalgia     HPI HPI Comments: Jamie Patrick is a 58 y.o. female who presents to the [facility] complaining of of a constant, moderate sore throat for 6 days. She also notes some associated cough, chills, and bilateral otalgia. The sore throat is exacerbated by swallowing. Pt has not taken anything for the symptoms. Nothing seems to make it better. She denies any fever, shortness of breath, chest tightness    Patient Active Problem List   Diagnosis Date Noted  . Prediabetes 09/01/2012  . Menopausal state 06/07/2012  . Osteopenia 03/02/2012  . Weight gain 03/02/2012  . Annual physical exam 10/23/2010  . Involuntary movements 10/23/2010  . Neck pain 08/22/2010  . BACK PAIN 09/05/2009  . INSOMNIA-SLEEP DISORDER-UNSPEC 09/05/2009  . LACTOSE INTOLERANCE 11/02/2008  . HIP PAIN, LEFT, CHRONIC 03/15/2008  . ALLERGIC RHINITIS 08/04/2007  . 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 HYPOTHYROIDISM 02/03/2007  . Anxiety and depression 10/28/2006   Past Medical History  Diagnosis Date  . Anxiety and depression   . Hypothyroidism   . Allergic rhinitis   . CTS (carpal tunnel syndrome)     question of  . Vitamin D deficiency   . Osteopenia     as reported by pt, DEXA @ gyn  . Colon polyps     BENIGN  . Dysplasia of cervix, low grade (CIN 1)     h/o CIN-1/CIN2  . Prediabetes 09/01/2012   Past Surgical History  Procedure Laterality Date  . Cholecystectomy  2010  . Tonsillectomy    . Uterine polyps  2003    Dr.Fernadez/ RESECTOSCOPIC POLYPECTOMY  . Cryotherapy      FOR CERVICAL DYSPLASIA  CIN-1/CIN-2  . Colonoscopy      repeat in 5 years  . Appendectomy     Allergies  Allergen Reactions  . Ceftriaxone Sodium     REACTION: nausea  . Cephalosporins Nausea And Vomiting  . Sulfonamide Derivatives     REACTION: near syncope, N\T\V   Prior to Admission medications   Medication Sig Start Date End Date Taking? Authorizing Provider  ALPRAZolam Duanne Moron) 0.5 MG tablet Take 1 tablet (0.5 mg total) by mouth at bedtime as needed for anxiety or sleep. 12/23/13  Yes Colon Branch, MD  Calcium Carbonate-Vitamin D (CALCIUM + D PO) Take by mouth.     Yes Historical Provider, MD  escitalopram (LEXAPRO) 20 MG tablet 1.5 tabs a day 10/24/13  Yes Colon Branch, MD  fish oil-omega-3 fatty acids 1000 MG capsule Take 2 g by mouth daily.   Yes Historical Provider, MD  Multiple Vitamin (MULTIVITAMIN) tablet Take 1 tablet by mouth daily.   Yes Historical Provider, MD  thyroid (ARMOUR THYROID) 30 MG tablet Take 1 tablet (30 mg total) by mouth daily before breakfast. 12/23/13  Yes Colon Branch, MD   History   Social History  . Marital Status: Married    Spouse Name: N/A    Number of Children: 0  . Years of Education: N/A   Occupational  History  . 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    [employer]     Social History Main Topics  . Smoking status: Never Smoker   . Smokeless tobacco: Never Used  . Alcohol Use: No     Comment:    . Drug Use: No  . Sexual Activity: No   Other Topics Concern  . Not on file   Social History Narrative   Original from [place]-        Review of Systems A complete 10 system review of systems was obtained and all systems are negative except as noted in the HPI and PMH.      Objective:   Physical Exam  Nursing note and vitals reviewed. Constitutional: She is oriented to person, place, and time. She appears well-developed and well-nourished. No distress.  HENT:  Head: Normocephalic and atraumatic.  Right Ear: Tympanic membrane normal.  Left Ear- Hemorrhagic area at top of  TM Purulent drainage in nose Post nasal drainage visible in throat No nodes  Eyes: Conjunctivae and EOM are normal.  Neck: Neck supple.  Cardiovascular: Normal rate.   Pulmonary/Chest: Effort normal and breath sounds normal. No respiratory distress. She has no wheezes.  Lungs CTA  Musculoskeletal: Normal range of motion.  Neurological: She is alert and oriented to person, place, and time.  Skin: Skin is warm and dry.  Psychiatric: She has a normal mood and affect. Her behavior is normal.    Filed Vitals:   01/01/14 1226  BP: 108/60  Pulse: 64  Temp: 98.1 F (36.7 C)  TempSrc: Oral  Resp: 16  Height: 4' 9.75" (1.467 m)  Weight: 142 lb 6 oz (64.581 kg)  SpO2: 100%         Assessment & Plan:  I have completed the patient encounter in its entirety as documented by the scribe, with editing by me where necessary. Robert P. Laney Pastor, M.D. Acute sinusitis, unspecified  Meds ordered this encounter  Medications  . amoxicillin (AMOXIL) 875 MG tablet    Sig: Take 1 tablet (875 mg total) by mouth 2 (two) times daily.    Dispense:  20 tablet    Refill:  0   otc sudafed

## 2014-01-10 ENCOUNTER — Other Ambulatory Visit: Payer: Self-pay | Admitting: Internal Medicine

## 2014-01-10 DIAGNOSIS — Z1231 Encounter for screening mammogram for malignant neoplasm of breast: Secondary | ICD-10-CM

## 2014-01-20 ENCOUNTER — Other Ambulatory Visit: Payer: Self-pay

## 2014-02-03 ENCOUNTER — Ambulatory Visit: Payer: 59 | Admitting: Internal Medicine

## 2014-02-03 ENCOUNTER — Ambulatory Visit (INDEPENDENT_AMBULATORY_CARE_PROVIDER_SITE_OTHER): Payer: 59 | Admitting: Internal Medicine

## 2014-02-03 ENCOUNTER — Encounter: Payer: Self-pay | Admitting: Internal Medicine

## 2014-02-03 VITALS — BP 102/66 | HR 82 | Temp 98.1°F | Wt 141.1 lb

## 2014-02-03 DIAGNOSIS — F329 Major depressive disorder, single episode, unspecified: Secondary | ICD-10-CM

## 2014-02-03 DIAGNOSIS — F32A Depression, unspecified: Secondary | ICD-10-CM

## 2014-02-03 DIAGNOSIS — R413 Other amnesia: Secondary | ICD-10-CM

## 2014-02-03 DIAGNOSIS — F418 Other specified anxiety disorders: Secondary | ICD-10-CM

## 2014-02-03 DIAGNOSIS — F410 Panic disorder [episodic paroxysmal anxiety] without agoraphobia: Secondary | ICD-10-CM

## 2014-02-03 DIAGNOSIS — R55 Syncope and collapse: Secondary | ICD-10-CM | POA: Insufficient documentation

## 2014-02-03 DIAGNOSIS — R739 Hyperglycemia, unspecified: Secondary | ICD-10-CM

## 2014-02-03 DIAGNOSIS — F419 Anxiety disorder, unspecified: Secondary | ICD-10-CM

## 2014-02-03 LAB — BASIC METABOLIC PANEL
BUN: 9 mg/dL (ref 6–23)
CALCIUM: 9.3 mg/dL (ref 8.4–10.5)
CO2: 23 meq/L (ref 19–32)
CREATININE: 0.7 mg/dL (ref 0.4–1.2)
Chloride: 105 mEq/L (ref 96–112)
GFR: 93.12 mL/min (ref >60.00)
Glucose, Bld: 90 mg/dL (ref 70–99)
Potassium: 4.3 mEq/L (ref 3.5–5.1)
Sodium: 136 mEq/L (ref 135–145)

## 2014-02-03 LAB — CBC WITH DIFFERENTIAL/PLATELET
BASOS PCT: 0.5 % (ref 0.0–3.0)
Basophils Absolute: 0 10*3/uL (ref 0.0–0.1)
EOS PCT: 2.1 % (ref 0.0–5.0)
Eosinophils Absolute: 0.1 10*3/uL (ref 0.0–0.7)
HCT: 41 % (ref 36.0–46.0)
Hemoglobin: 13.5 g/dL (ref 12.0–15.0)
Lymphocytes Relative: 42.8 % (ref 12.0–46.0)
Lymphs Abs: 2.9 10*3/uL (ref 0.7–4.0)
MCHC: 32.9 g/dL (ref 30.0–36.0)
MCV: 93.3 fl (ref 78.0–100.0)
MONO ABS: 0.3 10*3/uL (ref 0.1–1.0)
MONOS PCT: 4.9 % (ref 3.0–12.0)
Neutro Abs: 3.3 10*3/uL (ref 1.4–7.7)
Neutrophils Relative %: 49.7 % (ref 43.0–77.0)
PLATELETS: 217 10*3/uL (ref 150.0–400.0)
RBC: 4.39 Mil/uL (ref 3.87–5.11)
RDW: 13.9 % (ref 11.5–15.5)
WBC: 6.7 10*3/uL (ref 4.0–10.5)

## 2014-02-03 LAB — HEMOGLOBIN A1C: HEMOGLOBIN A1C: 5.4 % (ref 4.6–6.5)

## 2014-02-03 NOTE — Progress Notes (Signed)
Subjective:    Patient ID: Jamie Patrick, female    DOB: [birth year], 58 y.o.   MRN: [id number]  DOS:  02/03/2014 Type of visit - description : acute Interval history: Yesterday he had a very stressful day, she was very anxious and upset. She was sitting in her desk, developed nausea, she decided to stand up and go to the bathroom and she fainted, apparently lost consciousness for a few seconds, did not injure herself (apparently went down softly), was immediately helped by her coworkers. No bladder or bowel incontinence, her legs were shaking but otherwise no unusual movements. She felt dizzy, had a dry mouth, felt like she couldn't talk. She works at a [employer], nurses and a er assisted her, she briefly fainted again for 3-4 (?) seconds. BP was 140, blood sugar was reportedly normal, they noted her lip was swollen. They gave her injection of Benadryl thinking that she had anxiety and was recommended to come here today. No further events.    ROS No fever or chills No chest pain, difficulty breathing or palpitations. No vomiting or abdominal pain Not taking any new medications No diplopia, slurred speech or motor deficits. No recent head injuries. Chronic anxiety and insomnia: pt feels they are currently not well controlled despite taking her medications.   Past Medical History  Diagnosis Date  . Anxiety and depression   . Hypothyroidism   . Allergic rhinitis   . CTS (carpal tunnel syndrome)     question of  . Vitamin D deficiency   . Osteopenia     as reported by pt, DEXA @ gyn  . Colon polyps     BENIGN  . Dysplasia of cervix, low grade (CIN 1)     h/o CIN-1/CIN2  . Prediabetes 09/01/2012    Past Surgical History  Procedure Laterality Date  . Cholecystectomy  2010  . Tonsillectomy    . Uterine polyps  2003    Dr.Fernadez/ RESECTOSCOPIC POLYPECTOMY  . Cryotherapy      FOR CERVICAL DYSPLASIA CIN-1/CIN-2  . Colonoscopy      repeat in 5 years  . Appendectomy       History   Social History  . Marital Status: Married    Spouse Name: N/A    Number of Children: 0  . Years of Education: N/A   Occupational History  . 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    [employer]     Social History Main Topics  . Smoking status: Never Smoker   . Smokeless tobacco: Never Used  . Alcohol Use: No     Comment:    . Drug Use: No  . Sexual Activity: No   Other Topics Concern  . Not on file   Social History Narrative   Original from [place]-          Medication List       This list is accurate as of: 02/03/14  7:45 PM.  Always use your most recent med list.               ALPRAZolam 0.5 MG tablet  Commonly known as:  XANAX  Take 1 tablet (0.5 mg total) by mouth at bedtime as needed for anxiety or sleep.     CALCIUM + D PO  Take by mouth.     escitalopram 20 MG tablet  Commonly known as:  LEXAPRO  2 tablets a day     fish oil-omega-3 fatty acids 1000 MG  capsule  Take 2 g by mouth daily.     multivitamin tablet  Take 1 tablet by mouth daily.     thyroid 30 MG tablet  Commonly known as:  ARMOUR THYROID  Take 1 tablet (30 mg total) by mouth daily before breakfast.           Objective:   Physical Exam BP 102/66  Pulse 82  Temp(Src) 98.1 F (36.7 C) (Oral)  Wt 141 lb 2 oz (64.014 kg)  SpO2 98%  LMP 02/03/2009  General -- alert, well-developed, NAD.  Neck --no thyromegaly , normal carotid pulse; FROM HEENT-- Not pale.   Lungs -- normal respiratory effort, no intercostal retractions, no accessory muscle use, and normal breath sounds.  Heart-- normal rate, regular rhythm, no murmur.    Extremities-- no pretibial edema bilaterally  Neurologic--  alert & oriented X3. Speech normal, gait appropriate for age, strength symmetric and appropriate for age.  DTRs symmetric. EOMI, PERLA   Psych-- Cognition and judgment appear intact. Cooperative with normal attention span and concentration. slt anxious but nor depressed appearing.         Assessment & Plan:      Hyperglycemia, check A1c

## 2014-02-03 NOTE — Patient Instructions (Signed)
Get your blood work before you leave   Increase Lexapro to 2 tablets daily Continue with alprazolam at bedtime, ok to take 1.5 tablets if needed See her counselor   Please come back to the office in 3 weeks for a check up (30 minutes)

## 2014-02-03 NOTE — Progress Notes (Signed)
Pre visit review using our clinic review tool, if applicable. No additional management support is needed unless otherwise documented below in the visit note. 

## 2014-02-03 NOTE — Assessment & Plan Note (Signed)
Memory deficit, Continue to be an issue for the patient, saw Dr Baltazar Najjar (neuro) 2014, was sent to psychology --->  no mental problems  found except for anxiety and depression. If she feels she has problems , needs to see neuro again

## 2014-02-03 NOTE — Assessment & Plan Note (Signed)
Anxiety, Chronic anxiety, not well controlled. Increase Lexapro from 30 mg to 40 mg Reassess in 2 weeks Needs to continue seen a counselor

## 2014-02-03 NOTE — Assessment & Plan Note (Signed)
Syncope, Syncope associated with dizziness, in the setting of severe anxiety. Normal BP and  blood sugar immediately after the episode. EKG today nsr , RS R in V1 new but nonspecific, T-wave abnormalities in V3 not new. Normal neurological exam, likely related to anxiety Plan: ER if symptoms resurface,labs , reassess in 3 weeks

## 2014-02-09 ENCOUNTER — Encounter: Payer: Self-pay | Admitting: Women's Health

## 2014-02-15 ENCOUNTER — Ambulatory Visit: Payer: 59 | Admitting: Internal Medicine

## 2014-02-23 ENCOUNTER — Ambulatory Visit (HOSPITAL_COMMUNITY): Payer: 59

## 2014-02-24 ENCOUNTER — Ambulatory Visit (HOSPITAL_COMMUNITY): Payer: 59

## 2014-02-27 ENCOUNTER — Ambulatory Visit (INDEPENDENT_AMBULATORY_CARE_PROVIDER_SITE_OTHER): Payer: 59 | Admitting: Internal Medicine

## 2014-02-27 ENCOUNTER — Encounter: Payer: Self-pay | Admitting: Internal Medicine

## 2014-02-27 VITALS — BP 110/66 | HR 84 | Temp 98.0°F | Wt 143.5 lb

## 2014-02-27 DIAGNOSIS — F32A Depression, unspecified: Secondary | ICD-10-CM

## 2014-02-27 DIAGNOSIS — G4700 Insomnia, unspecified: Secondary | ICD-10-CM

## 2014-02-27 DIAGNOSIS — M25561 Pain in right knee: Secondary | ICD-10-CM

## 2014-02-27 DIAGNOSIS — M25562 Pain in left knee: Secondary | ICD-10-CM | POA: Insufficient documentation

## 2014-02-27 DIAGNOSIS — F419 Anxiety disorder, unspecified: Principal | ICD-10-CM

## 2014-02-27 DIAGNOSIS — F418 Other specified anxiety disorders: Secondary | ICD-10-CM

## 2014-02-27 DIAGNOSIS — F329 Major depressive disorder, single episode, unspecified: Secondary | ICD-10-CM

## 2014-02-27 MED ORDER — ZOLPIDEM TARTRATE 10 MG PO TABS: 10.0000 mg | ORAL_TABLET | Freq: Every evening | ORAL | Status: DC | PRN

## 2014-02-27 NOTE — Assessment & Plan Note (Signed)
Currently on Xanax, symptoms not well-controlled. Previously trazodone didn't work. We tried Belsomra but was very expensive, she couldn't get it. Plan: Trial with Ambien for 10 days, see instructions, hold xanax  Repeat UDS (UDS 11-2003 did not show Xanax although she is getting the prescriptions. Today she reports that she is taking Xanax qhs )

## 2014-02-27 NOTE — Patient Instructions (Signed)
Go to the lab for a urine test (UDS) Do not use alprazolam for the next 10 days, instead try Ambien to help you sleep. Let me know how that works  Next visit in 3 months please make an appointment

## 2014-02-27 NOTE — Progress Notes (Signed)
Subjective:    Patient ID: Jamie Patrick, female    DOB: 09-18-1955, 58 y.o.   MRN: [id number]  DOS:  02/27/2014 Type of visit - description : Follow-up Interval history: Anxiety depression, there has been positive changes in her job environment, feel better Insomnia, on Xanax, not helping as well as the patient would like, still wakes up in the middle of the night and usually eats.    ROS  No suicidal ideas No further syncope Admits to chronic right knee and hip pain.  Past Medical History  Diagnosis Date  . Anxiety and depression   . Hypothyroidism   . Allergic rhinitis   . CTS (carpal tunnel syndrome)     question of  . Vitamin D deficiency   . Osteopenia     as reported by pt, DEXA @ gyn  . Colon polyps     BENIGN  . Dysplasia of cervix, low grade (CIN 1)     h/o CIN-1/CIN2  . Prediabetes 09/01/2012    Past Surgical History  Procedure Laterality Date  . Cholecystectomy  2010  . Tonsillectomy    . Uterine polyps  2003    Dr.Fernadez/ RESECTOSCOPIC POLYPECTOMY  . Cryotherapy      FOR CERVICAL DYSPLASIA CIN-1/CIN-2  . Colonoscopy      repeat in 5 years  . Appendectomy      History   Social History  . Marital Status: Married    Spouse Name: N/A    Number of Children: 0  . Years of Education: N/A   Occupational History  . 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    [employer]     Social History Main Topics  . Smoking status: Never Smoker   . Smokeless tobacco: Never Used  . Alcohol Use: No     Comment:    . Drug Use: No  . Sexual Activity: No   Other Topics Concern  . Not on file   Social History Narrative   Original from [place]-          Medication List       This list is accurate as of: 02/27/14  1:13 PM.  Always use your most recent med list.               ALPRAZolam 0.5 MG tablet  Commonly known as:  XANAX  Take 1 tablet (0.5 mg total) by mouth at bedtime as needed for anxiety or sleep.     CALCIUM + D PO  Take by mouth.     escitalopram  20 MG tablet  Commonly known as:  LEXAPRO  2 tablets a day     fish oil-omega-3 fatty acids 1000 MG capsule  Take 2 g by mouth daily.     multivitamin tablet  Take 1 tablet by mouth daily.     thyroid 30 MG tablet  Commonly known as:  ARMOUR THYROID  Take 1 tablet (30 mg total) by mouth daily before breakfast.     zolpidem 10 MG tablet  Commonly known as:  AMBIEN  Take 1 tablet (10 mg total) by mouth at bedtime as needed for sleep.           Objective:   Physical Exam BP 110/66 mmHg  Pulse 84  Temp(Src) 98 F (36.7 C) (Oral)  Wt 143 lb 8 oz (65.091 kg)  SpO2 97%  LMP 02/03/2009  General -- alert, well-developed, NAD.  Lungs -- normal respiratory effort, no intercostal  retractions, no accessory muscle use, and normal breath sounds.  Heart-- normal rate, regular rhythm, no murmur.  Extremities-- no pretibial edema bilaterally  Neurologic--  alert & oriented X3. Speech normal, gait appropriate for age, strength symmetric and appropriate for age.  Psych-- Cognition and judgment appear intact. Cooperative with normal attention span and concentration. No anxious or depressed appearing.       Assessment & Plan:

## 2014-02-27 NOTE — Assessment & Plan Note (Signed)
Doing better compared to the last office visit. We increase Lexapro to 40 mg but also there has been positive changes in her work environment. Plan: Continue Lexapro 40 ----------- Chart review: Pt is Intolerant to Abilify. Viibrid -------> cuased severe dizzines Saw Dr Toy Care x 1 ~ 05-2010, will not longer see

## 2014-02-27 NOTE — Progress Notes (Signed)
Pre visit review using our clinic review tool, if applicable. No additional management support is needed unless otherwise documented below in the visit note. 

## 2014-02-27 NOTE — Assessment & Plan Note (Signed)
Hip and knee pain, right side: Chronic issue, refer to sports medicine

## 2014-03-03 ENCOUNTER — Ambulatory Visit (HOSPITAL_COMMUNITY)
Admission: RE | Admit: 2014-03-03 | Discharge: 2014-03-03 | Disposition: A | Discharge status: Home / Self Care | Payer: 59 | Source: Ambulatory Visit | Attending: Internal Medicine | Admitting: Internal Medicine

## 2014-03-03 DIAGNOSIS — Z1231 Encounter for screening mammogram for malignant neoplasm of breast: Secondary | ICD-10-CM | POA: Insufficient documentation

## 2014-03-07 ENCOUNTER — Ambulatory Visit: Payer: 59 | Admitting: Family Medicine

## 2014-03-09 ENCOUNTER — Telehealth: Payer: 59 | Admitting: Physician Assistant

## 2014-03-09 ENCOUNTER — Encounter: Payer: Self-pay | Admitting: Internal Medicine

## 2014-03-09 DIAGNOSIS — J329 Chronic sinusitis, unspecified: Principal | ICD-10-CM

## 2014-03-09 DIAGNOSIS — B9789 Other viral agents as the cause of diseases classified elsewhere: Secondary | ICD-10-CM

## 2014-03-09 NOTE — Progress Notes (Signed)
We are sorry that you are not feeling well.  Here is how we plan to help!  Based on what you have shared with me it looks like you have sinusitis.  Sinusitis is inflammation and infection in the sinus cavities of the head.  Based on your presentation I believe you most likely have Acute Viral Sinusitis. This is an infection most likely caused by a virus.  There is not specific treatment for viral sinusitis other than to help you with the symptoms until the infection runs it's course.  You may use an oral decongestant such as Mucinex D or if you have glaucoma or high blood pressure use plain Mucinex.  Saline nasal spray can help and can safely be used as often as needed for congestion.  Alternate tylenol and ibuprofen if needed for sore throat.  Salt-water gargles may also be beneficial. Some authorities believe that zinc sprays or the use of Echinacea may shorten the course of your symptoms  Sinus infections are not as easily transmitted as other respiratory infection, however we still recommend that you avoid close contact with loved ones, especially the very young and elderly.  Remember to wash your hands thoroughly throughout the day as this is the number one way to prevent the spread of infection!  Home Care:  Only take medications as instructed by your medical team.  Complete the entire course of an antibiotic.  Do not take these medications with alcohol.  A steam or ultrasonic humidifier can help congestion.  You can place a towel over your head and breathe in the steam from hot water coming from a faucet.  Avoid close contacts especially the very young and the elderly.  Cover your mouth when you cough or sneeze.  Always remember to wash your hands.  Get Help Right Away If:  You develop worsening fever or sinus pain.  You develop a severe head ache or visual changes.  Your symptoms persist after you have completed your treatment plan.  Make sure you  Understand these  instructions.  Will watch your condition.  Will get help right away if you are not doing well or get worse.  Your e-visit answers were reviewed by a board certified advanced clinical practitioner to complete your personal care plan.  Depending on the condition, your plan could have included both over the counter or prescription medications.  Please review your pharmacy choice.  If there is a problem, you may call our nursing hot line at [phone] and have the prescription routed to another pharmacy.  Your safety is important to [place].  If you have drug allergies check your prescription carefully.    You can use MyChart to ask questions about today's visit, request a non-urgent call back, or ask for a work or school excuse.  You will get an e-mail in the next two days asking about your experience.  I hope that your e-visit has been valuable and will speed your recovery. Thank you for using e-visits.

## 2014-03-10 NOTE — Progress Notes (Signed)
Duplicate encounter

## 2014-03-15 ENCOUNTER — Encounter: Payer: Self-pay | Admitting: Internal Medicine

## 2014-03-17 ENCOUNTER — Ambulatory Visit (INDEPENDENT_AMBULATORY_CARE_PROVIDER_SITE_OTHER): Payer: 59 | Admitting: Sports Medicine

## 2014-03-17 ENCOUNTER — Encounter: Payer: Self-pay | Admitting: Sports Medicine

## 2014-03-17 VITALS — BP 123/58 | Ht <= 58 in | Wt 130.0 lb

## 2014-03-17 DIAGNOSIS — M533 Sacrococcygeal disorders, not elsewhere classified: Secondary | ICD-10-CM | POA: Insufficient documentation

## 2014-03-17 MED ORDER — AMITRIPTYLINE HCL 25 MG PO TABS: 25.0000 mg | ORAL_TABLET | Freq: Every day | ORAL | Status: DC

## 2014-03-17 NOTE — Progress Notes (Signed)
   Subjective:    Patient ID: Jamie Patrick, female    DOB: 1955-07-27, 58 y.o.   MRN: [id number]  HPI  Right leg pain: The pain started 2 months ago after  slipped and fell at work. Toes were hyperextended backwards and bruising occurred on her foot. She was able to walk on it immediately. She was seen at the doctor the following day and was told to take aleve. She noticed after stopping the aleve a few days later her leg started to hurt. The pain originates in her right buttock and radiates to mid-calf. The pain feels worse at the knee level. No numbness, but admits to burning pain. She has a history of hypothyroid, osteopenia and depression. She takes calcium, magnesium, vitamin D and flax.  Soc:  Husband w cancer/  Under stress/ sleeps poorly and this hurts at night  Never smoker  Past Medical History  Diagnosis Date  . Anxiety and depression   . Hypothyroidism   . Allergic rhinitis   . CTS (carpal tunnel syndrome)     question of  . Vitamin D deficiency   . Osteopenia     as reported by pt, DEXA @ gyn  . Colon polyps     BENIGN  . Dysplasia of cervix, low grade (CIN 1)     h/o CIN-1/CIN2  . Prediabetes 09/01/2012   Allergies  Allergen Reactions  . Ceftriaxone Sodium     REACTION: nausea  . Cephalosporins Nausea And Vomiting  . Sulfonamide Derivatives     REACTION: near syncope, N\T\V    FHX: arthritis  Review of Systems History of present illness    Objective:   Physical Exam BP 123/58 mmHg  Ht 4\' 10"  (1.473 m)  Wt 130 lb (58.968 kg)  BMI 27.18 kg/m2  LMP 02/03/2009 Gen: Pleasant, female no acute distress, nontoxic appearance, well-developed, well-nourished, moderately overweight. Alert, oriented 3. MSK: No erythema, no edema, no soft tissue swelling, no bruising. No bony tenderness of the hip, with the exception of right SI joint tenderness with palpation. Full range of motion of the knee and hip in all planes, without pain. Weakness in abd/add of right hip.  Negative straight leg raises, negative Corky Sox.     Assessment & Plan:  Jamie Patrick is a 58 y.o. female present with right sacroiliac inflammation with sciatica. - Stretches performed 2-3 times a day, abduction, adduction and leg extension exercises given. - 25 mg amitriptyline daily at bedtime - Patient to follow-up in 6 weeks.

## 2014-03-17 NOTE — Patient Instructions (Signed)
Complete instructions as described today three times a day.  Take the medication prescribed (amitriptyline) prior to bed.  F/U 6 weeks

## 2014-03-17 NOTE — Assessment & Plan Note (Signed)
RT SIJ injury  Suspect stress and poor sleep are part of issue in recovery  See OV

## 2014-03-20 ENCOUNTER — Encounter: Payer: Self-pay | Admitting: Sports Medicine

## 2014-03-21 ENCOUNTER — Ambulatory Visit: Payer: 59 | Admitting: Internal Medicine

## 2014-03-21 ENCOUNTER — Other Ambulatory Visit (HOSPITAL_COMMUNITY)
Admission: RE | Admit: 2014-03-21 | Discharge: 2014-03-21 | Disposition: A | Discharge status: Home / Self Care | Payer: 59 | Source: Ambulatory Visit | Attending: Women's Health | Admitting: Women's Health

## 2014-03-21 ENCOUNTER — Encounter: Payer: Self-pay | Admitting: Women's Health

## 2014-03-21 ENCOUNTER — Ambulatory Visit (INDEPENDENT_AMBULATORY_CARE_PROVIDER_SITE_OTHER): Payer: 59 | Admitting: Women's Health

## 2014-03-21 VITALS — BP 120/78 | Ht <= 58 in | Wt 143.0 lb

## 2014-03-21 DIAGNOSIS — Z1151 Encounter for screening for human papillomavirus (HPV): Secondary | ICD-10-CM | POA: Insufficient documentation

## 2014-03-21 DIAGNOSIS — Z01419 Encounter for gynecological examination (general) (routine) without abnormal findings: Secondary | ICD-10-CM

## 2014-03-21 DIAGNOSIS — Z79890 Hormone replacement therapy (postmenopausal): Secondary | ICD-10-CM

## 2014-03-21 MED ORDER — PROGESTERONE MICRONIZED 200 MG PO CAPS: 200.0000 mg | ORAL_CAPSULE | Freq: Every day | ORAL | Status: DC

## 2014-03-21 MED ORDER — ESTRADIOL 0.5 MG PO TABS: 0.5000 mg | ORAL_TABLET | Freq: Every day | ORAL | Status: DC

## 2014-03-21 NOTE — Progress Notes (Signed)
Attention Jamie Patrick for clarification

## 2014-03-21 NOTE — Progress Notes (Addendum)
Jamie Patrick 12/23/1955 [id number]    History:    Presents for annual exam.  Postmenopausal/no bleeding for at least 4 years, but states hot flashes have significantly increased this past year. Hypothyroid but on stable dose. States has had difficulty sleeping , embarrassed at work due to numerous hot flashes. Requests something to help. Long-term history of anxiety/depression currently on Lexapro doing well. Primary care manages. Normal mammogram history. 2000 LEEP for CIN-2, cryo-2002 normal Paps after.  2014 DEXA T score -1.6 FRAX 14%/0.8%. Husband prostate cancer, not sexually active.  Past medical history, past surgical history, family history and social history were all reviewed and documented in the EPIC chart. Works in Dentist at [employer]. Brother diabetes, hypertension.   ROS:  A  12 point ROS was performed and pertinent positives and negatives are included.  Exam:  Filed Vitals:   03/21/14 1602  BP: 120/78    General appearance:  Normal Thyroid:  Symmetrical, normal in size, without palpable masses or nodularity. Respiratory  Auscultation:  Clear without wheezing or rhonchi Cardiovascular  Auscultation:  Regular rate, without rubs, murmurs or gallops  Edema/varicosities:  Not grossly evident Abdominal  Soft,nontender, without masses, guarding or rebound.  Liver/spleen:  No organomegaly noted  Hernia:  None appreciated  Skin  Inspection:  Grossly normal   Breasts: Examined lying and sitting.     Right: Without masses, retractions, discharge or axillary adenopathy.     Left: Without masses, retractions, discharge or axillary adenopathy. Gentitourinary   Inguinal/mons:  Normal without inguinal adenopathy  External genitalia:  Normal  BUS/Urethra/Skene's glands:  Normal  Vagina:  Normal  Cervix:  Normal  Uterus:  normal in size, shape and contour.  Midline and mobile  Adnexa/parametria:     Rt: Without masses or tenderness.   Lt: Without masses or  tenderness.  Anus and perineum: Normal  Digital rectal exam: Normal sphincter tone without palpated masses or tenderness  Assessment/Plan:  58 y.o. MHF G0 for annual exam with complaint of numerous hot flashes requesting medication to help.  Menopausal symptoms 2000 LEEP for CIN-2, 2002 cryo-normal Paps after Anxiety/depression stable on Lexapro per primary care Hypothyroid-primary care manages labs and meds Osteopenia without elevated FRAX  Plan: Hormone therapy reviewed, reviewed risks of blood clots, strokes, breast cancer, states would like to try. Options reviewed, will try estradiol 0.5 by mouth daily and Prometrium 200 at bedtime day 1 through 12 of each month. Reviewed may get small amount of bleeding, call if heavy or if no relief of symptoms. SBE's, continue annual mammogram, calcium rich diet, vitamin D 2000 daily. Home safety, fall prevention and importance of regular exercise reviewed. Pap with HR HPV typing, new screening guidelines reviewed.  Huel Cote [facility], 5:17 PM 03/21/2014

## 2014-03-21 NOTE — Patient Instructions (Signed)
Health Recommendations for Postmenopausal Women Respected and ongoing research has looked at the most common causes of death, disability, and poor quality of life in postmenopausal women. The causes include heart disease, diseases of blood vessels, diabetes, depression, cancer, and bone loss (osteoporosis). Many things can be done to help lower the chances of developing these and other common problems. CARDIOVASCULAR DISEASE Heart Disease: A heart attack is a medical emergency. Know the signs and symptoms of a heart attack. Below are things women can do to reduce their risk for heart disease.   Do not smoke. If you smoke, quit.  Aim for a healthy weight. Being overweight causes many preventable deaths. Eat a healthy and balanced diet and drink an adequate amount of liquids.  Get moving. Make a commitment to be more physically active. Aim for 30 minutes of activity on most, if not all days of the week.  Eat for heart health. Choose a diet that is low in saturated fat and cholesterol and eliminate trans fat. Include whole grains, vegetables, and fruits. Read and understand the labels on food containers before buying.  Know your numbers. Ask your caregiver to check your blood pressure, cholesterol (total, HDL, LDL, triglycerides) and blood glucose. Work with your caregiver on improving your entire clinical picture.  High blood pressure. Limit or stop your table salt intake (try salt substitute and food seasonings). Avoid salty foods and drinks. Read labels on food containers before buying. Eating well and exercising can help control high blood pressure. STROKE  Stroke is a medical emergency. Stroke may be the result of a blood clot in a blood vessel in the brain or by a brain hemorrhage (bleeding). Know the signs and symptoms of a stroke. To lower the risk of developing a stroke:  Avoid fatty foods.  Quit smoking.  Control your diabetes, blood pressure, and irregular heart rate. THROMBOPHLEBITIS  (BLOOD CLOT) OF THE LEG  Becoming overweight and leading a stationary lifestyle may also contribute to developing blood clots. Controlling your diet and exercising will help lower the risk of developing blood clots. CANCER SCREENING  Breast Cancer: Take steps to reduce your risk of breast cancer.  You should practice "breast self-awareness." This means understanding the normal appearance and feel of your breasts and should include breast self-examination. Any changes detected, no matter how small, should be reported to your caregiver.  After age 40, you should have a clinical breast exam (CBE) every year.  Starting at age 40, you should consider having a mammogram (breast X-ray) every year.  If you have a family history of breast cancer, talk to your caregiver about genetic screening.  If you are at high risk for breast cancer, talk to your caregiver about having an MRI and a mammogram every year.  Intestinal or Stomach Cancer: Tests to consider are a rectal exam, fecal occult blood, sigmoidoscopy, and colonoscopy. Women who are high risk may need to be screened at an earlier age and more often.  Cervical Cancer:  Beginning at age 30, you should have a Pap test every 3 years as long as the past 3 Pap tests have been normal.  If you have had past treatment for cervical cancer or a condition that could lead to cancer, you need Pap tests and screening for cancer for at least 20 years after your treatment.  If you had a hysterectomy for a problem that was not cancer or a condition that could lead to cancer, then you no longer need Pap tests.    If you are between ages 65 and 70, and you have had normal Pap tests going back 10 years, you no longer need Pap tests.  If Pap tests have been discontinued, risk factors (such as a new sexual partner) need to be reassessed to determine if screening should be resumed.  Some medical problems can increase the chance of getting cervical cancer. In these  cases, your caregiver may recommend more frequent screening and Pap tests.  Uterine Cancer: If you have vaginal bleeding after reaching menopause, you should notify your caregiver.  Ovarian Cancer: Other than yearly pelvic exams, there are no reliable tests available to screen for ovarian cancer at this time except for yearly pelvic exams.  Lung Cancer: Yearly chest X-rays can detect lung cancer and should be done on high risk women, such as cigarette smokers and women with chronic lung disease (emphysema).  Skin Cancer: A complete body skin exam should be done at your yearly examination. Avoid overexposure to the sun and ultraviolet light lamps. Use a strong sun block cream when in the sun. All of these things are important for lowering the risk of skin cancer. MENOPAUSE Menopause Symptoms: Hormone therapy products are effective for treating symptoms associated with menopause:  Moderate to severe hot flashes.  Night sweats.  Mood swings.  Headaches.  Tiredness.  Loss of sex drive.  Insomnia.  Other symptoms. Hormone replacement carries certain risks, especially in older women. Women who use or are thinking about using estrogen or estrogen with progestin treatments should discuss that with their caregiver. Your caregiver will help you understand the benefits and risks. The ideal dose of hormone replacement therapy is not known. The Food and Drug Administration (FDA) has concluded that hormone therapy should be used only at the lowest doses and for the shortest amount of time to reach treatment goals.  OSTEOPOROSIS Protecting Against Bone Loss and Preventing Fracture If you use hormone therapy for prevention of bone loss (osteoporosis), the risks for bone loss must outweigh the risk of the therapy. Ask your caregiver about other medications known to be safe and effective for preventing bone loss and fractures. To guard against bone loss or fractures, the following is recommended:  If  you are younger than age 50, take 1000 mg of calcium and at least 600 mg of Vitamin D per day.  If you are older than age 50 but younger than age 70, take 1200 mg of calcium and at least 600 mg of Vitamin D per day.  If you are older than age 70, take 1200 mg of calcium and at least 800 mg of Vitamin D per day. Smoking and excessive alcohol intake increases the risk of osteoporosis. Eat foods rich in calcium and vitamin D and do weight bearing exercises several times a week as your caregiver suggests. DIABETES Diabetes Mellitus: If you have type I or type 2 diabetes, you should keep your blood sugar under control with diet, exercise, and recommended medication. Avoid starchy and fatty foods, and too many sweets. Being overweight can make diabetes control more difficult. COGNITION AND MEMORY Cognition and Memory: Menopausal hormone therapy is not recommended for the prevention of cognitive disorders such as Alzheimer's disease or memory loss.  DEPRESSION  Depression may occur at any age, but it is common in elderly women. This may be because of physical, medical, social (loneliness), or financial problems and needs. If you are experiencing depression because of medical problems and control of symptoms, talk to your caregiver about this. Physical   activity and exercise may help with mood and sleep. Community and volunteer involvement may improve your sense of value and worth. If you have depression and you feel that the problem is getting worse or becoming severe, talk to your caregiver about which treatment options are best for you. ACCIDENTS  Accidents are common and can be serious in elderly woman. Prepare your house to prevent accidents. Eliminate throw rugs, place hand bars in bath, shower, and toilet areas. Avoid wearing high heeled shoes or walking on wet, snowy, and icy areas. Limit or stop driving if you have vision or hearing problems, or if you feel you are unsteady with your movements and  reflexes. HEPATITIS C Hepatitis C is a type of viral infection affecting the liver. It is spread mainly through contact with blood from an infected person. It can be treated, but if left untreated, it can lead to severe liver damage over the years. Many people who are infected do not know that the virus is in their blood. If you are a "baby-boomer", it is recommended that you have one screening test for Hepatitis C. IMMUNIZATIONS  Several immunizations are important to consider having during your senior years, including:   Tetanus, diphtheria, and pertussis booster shot.  Influenza every year before the flu season begins.  Pneumonia vaccine.  Shingles vaccine.  Others, as indicated based on your specific needs. Talk to your caregiver about these. Document Released: 05/16/2005 Document Revised: 08/08/2013 Document Reviewed: 01/10/2008 ExitCare Patient Information 2015 [employer]. This information is not intended to replace advice given to you by your health care provider. Make sure you discuss any questions you have with your health care provider.  

## 2014-03-22 ENCOUNTER — Encounter: Payer: Self-pay | Admitting: *Deleted

## 2014-03-22 ENCOUNTER — Other Ambulatory Visit: Payer: Self-pay | Admitting: Internal Medicine

## 2014-03-22 LAB — URINALYSIS W MICROSCOPIC + REFLEX CULTURE
Bilirubin Urine: NEGATIVE
Casts: NONE SEEN
Glucose, UA: NEGATIVE mg/dL
Ketones, ur: NEGATIVE mg/dL
Leukocytes, UA: NEGATIVE
Nitrite: NEGATIVE
Protein, ur: NEGATIVE mg/dL
SPECIFIC GRAVITY, URINE: 1.01 (ref 1.005–1.030)
UROBILINOGEN UA: 0.2 mg/dL (ref 0.0–1.0)
WBC, UA: NONE SEEN WBC/hpf (ref <3)
pH: 6.5 (ref 5.0–8.0)

## 2014-03-22 NOTE — Addendum Note (Signed)
Addended by: Huel Cote on: 03/22/2014 12:57 PM   Modules accepted: Orders

## 2014-03-24 LAB — URINALYSIS W MICROSCOPIC + REFLEX CULTURE

## 2014-03-24 LAB — CYTOLOGY - PAP

## 2014-04-03 ENCOUNTER — Encounter: Payer: Self-pay | Admitting: Internal Medicine

## 2014-04-03 ENCOUNTER — Encounter: Payer: Self-pay | Admitting: Sports Medicine

## 2014-04-13 ENCOUNTER — Encounter: Payer: Self-pay | Admitting: Women's Health

## 2014-04-14 ENCOUNTER — Encounter: Payer: Self-pay | Admitting: Women's Health

## 2014-04-14 ENCOUNTER — Ambulatory Visit (INDEPENDENT_AMBULATORY_CARE_PROVIDER_SITE_OTHER): Payer: 59 | Admitting: Women's Health

## 2014-04-14 VITALS — BP 126/80 | Ht <= 58 in | Wt 143.0 lb

## 2014-04-14 DIAGNOSIS — B3731 Acute candidiasis of vulva and vagina: Secondary | ICD-10-CM

## 2014-04-14 DIAGNOSIS — K648 Other hemorrhoids: Secondary | ICD-10-CM

## 2014-04-14 DIAGNOSIS — B373 Candidiasis of vulva and vagina: Secondary | ICD-10-CM

## 2014-04-14 LAB — WET PREP FOR TRICH, YEAST, CLUE
Clue Cells Wet Prep HPF POC: NONE SEEN
Trich, Wet Prep: NONE SEEN

## 2014-04-14 LAB — URINALYSIS W MICROSCOPIC + REFLEX CULTURE
Bilirubin Urine: NEGATIVE
CASTS: NONE SEEN
Crystals: NONE SEEN
Glucose, UA: NEGATIVE mg/dL
KETONES UR: NEGATIVE mg/dL
Nitrite: NEGATIVE
Protein, ur: NEGATIVE mg/dL
Specific Gravity, Urine: 1.015 (ref 1.005–1.030)
UROBILINOGEN UA: 0.2 mg/dL (ref 0.0–1.0)
pH: 6.5 (ref 5.0–8.0)

## 2014-04-14 MED ORDER — FLUCONAZOLE 150 MG PO TABS: ORAL_TABLET | ORAL | Status: DC

## 2014-04-14 MED ORDER — LIDOCAINE-HYDROCORTISONE ACE 3-0.5 % RE CREA: TOPICAL_CREAM | RECTAL | Status: DC

## 2014-04-14 NOTE — Addendum Note (Signed)
Addended by: Burnett Kanaris on: 04/14/2014 04:49 PM   Modules accepted: Orders

## 2014-04-14 NOTE — Progress Notes (Signed)
Presents with vaginal discomfort starting last night with irritation, burning. White discharge today. Hemorrhoids bothersome with occasional bleeding. Fiber doesn't help. Denies urinary symptoms.  Exam: Well appearing. External genitalia normal. Speculum exam, curdy white discharge with discomfort. Bimanual exam vaginal discomfort, but no CMT, no adnexal tenderness.  UA: Yeast, WBC 11-20, RBC 7-10, small leukocytes Wet Prep: Many yeast   Yeast vaginitis  Hemorrhoids   Plan: Diflucan 150 mg PO one time. Repeat in 3 days if still symptomatic, 1 refill. Anamantel HC 3-0.5% cream rectally at night PRN. Call if symptoms persist or worsen. Yeast prevention reviewed. Urine culture pending.

## 2014-04-14 NOTE — Patient Instructions (Signed)

## 2014-04-15 LAB — URINE CULTURE: Colony Count: 50000

## 2014-04-25 ENCOUNTER — Encounter: Payer: Self-pay | Admitting: Internal Medicine

## 2014-04-25 ENCOUNTER — Other Ambulatory Visit: Payer: Self-pay

## 2014-04-25 DIAGNOSIS — K649 Unspecified hemorrhoids: Secondary | ICD-10-CM

## 2014-05-03 ENCOUNTER — Encounter: Payer: Self-pay | Admitting: Sports Medicine

## 2014-05-03 ENCOUNTER — Ambulatory Visit (INDEPENDENT_AMBULATORY_CARE_PROVIDER_SITE_OTHER): Payer: 59 | Admitting: Sports Medicine

## 2014-05-03 VITALS — Ht <= 58 in | Wt 130.0 lb

## 2014-05-03 DIAGNOSIS — M533 Sacrococcygeal disorders, not elsewhere classified: Secondary | ICD-10-CM

## 2014-05-03 NOTE — Progress Notes (Signed)
   Subjective:    Patient ID: Jamie Patrick, female    DOB: June 13, 1955, 59 y.o.   MRN: [id number]  HPI Manuel is a 59 year old female who presents for follow-up of right SI joint pain. She has been doing a home exercise program and taking amitriptyline 25 mg once nightly. She says she is sleeping better. She says that her right SI joint pain is 90-95% improved. She is very pleased. She is undergoing some stress in her life, as her husband has been battling leukemia and is currently undergoing continued treatment. She is still optimistically philosophical about the future. She denies any feelings of hopelessness. Symptoms are aggravated with activity and relieved with rest, stretching, and strengthening exercise.  Past medical history, social history, medications, and allergies were reviewed and are up to date in the chart.  Review of Systems 7 point review of systems was performed and was otherwise negative unless noted in the history of present illness.     Objective:   Physical Exam Ht 4\' 10"  (1.473 m)  Wt 130 lb (58.968 kg)  BMI 27.18 kg/m2  LMP 02/03/2009 GEN: The patient is well-developed well-nourished female and in no acute distress.  She is awake alert and oriented x3. SKIN: warm and well-perfused, no rash  EXTR: No lower extremity edema or calf tenderness Neuro: Strength 5/5 globally. Sensation intact throughout. No focal deficits. Vasc: +2 bilateral distal pulses. No edema.  MSK: good hip abductor strength. No leg length discrepancy. Tenderness to palpation at the right SI joint. Negative straight leg raise. Positive Faber at the [facility] joint. Slightly weakened core muscles.    Assessment & Plan:  Please see problem based assessment and plan in the problem list.

## 2014-05-03 NOTE — Assessment & Plan Note (Signed)
-  90-95% improved today -Continue amitriptyline 25 mg by mouth once nightly, continue for at least the next 2 months. If she is doing well, she may consider cutting back to once every other night, and then discontinuing. -Continue stretches and strengthening exercises -Touch base in 3-4 months for reevaluation or sooner if needed.

## 2014-05-24 ENCOUNTER — Other Ambulatory Visit: Payer: Self-pay | Admitting: Internal Medicine

## 2014-05-24 NOTE — Telephone Encounter (Signed)
We'll refill, she takes that as needed

## 2014-05-24 NOTE — Telephone Encounter (Signed)
Pt is requesting refill on Alprazolam.  Last OV: 02/27/2014  Alprazolam d/c on 03/21/2014, Pt reported "Not Taking."  Please advise.

## 2014-05-24 NOTE — Telephone Encounter (Signed)
Faxed to [place].

## 2014-05-29 ENCOUNTER — Ambulatory Visit (INDEPENDENT_AMBULATORY_CARE_PROVIDER_SITE_OTHER): Payer: 59 | Admitting: Family Medicine

## 2014-05-29 ENCOUNTER — Encounter: Payer: Self-pay | Admitting: Family Medicine

## 2014-05-29 VITALS — Ht <= 58 in | Wt 145.7 lb

## 2014-05-29 DIAGNOSIS — E669 Obesity, unspecified: Secondary | ICD-10-CM

## 2014-05-29 NOTE — Patient Instructions (Addendum)
-   GOALS: 1. Eat at least 3 REAL meals and 1-2 snacks per day.  Aim for no more than 5 hours between eating.  Eat breakfast within one hour of getting up.   - A REAL meal includes a protein, a starch, and veg's and/or fruit.    - OR:  Would you serve this to a guest in your home, and call it a meal? 2. Obtain twice as many veg's as protein or carbohydrate foods for both lunch and dinner. 3. (Continue to do Zumba on Saturdays.)  Walk at least 10 minutes 2 days a week (to be increased over the next few weeks).  (On Sundays, you can drive to a park or to a school track to walk.)  Track your progress on your GOALS SHEET, and bring to follow-up.    If Jamie Patrick is interested in meeting for nutrition, he should ask his insur company if his policy covers [place], codes [phone] and [phone].

## 2014-05-29 NOTE — Progress Notes (Signed)
Medical Nutrition Therapy:  Appt start time: 1130 end time:  1230. PCP: Colon Branch, MD  Assessment:  Primary concerns today: Weight management.  Jamie Patrick has also been diagnosed with pre-DM (2014).   [name] wants to lose weight, and is frustrated that she has not been able to do so as of yet.  She has tried to cut back on the sweets, esp ice cream.  She is taking an online medical terminology class, which sometimes keeps her up late at night.  Serria's husband works Friday thru Tuesday, 6:30 AM-3 PM.  She consistently has breakfast and dinner with him, but on weekends, she tends to graze through the day instead of eating lunch.    Learning Readiness: Ready  Barriers to learning/adherence to lifestyle change: does not feel safe walking in her own neighborhood; time constraints for exercise; stressors related to husband's health.    Usual eating pattern includes 3 meals and 1 snack per day. Frequent foods and beverages include Raisin Bran w/ soymilk or oatmeal, coffee w/ crm (<1 X wk), chx, brn rice, apple/banana/tangerine, water, tea.  Avoided foods include most dairy (lactose intolerant; tolerates in ltd amts).   Usual physical activity includes Zumba class ~1 X wk.  24-hr recall: (Up at 11:15 AM) B (11:30 AM)-  1 c Raisin Bran, 3/4 c soymilk, coffee, 1 tbsp cream, 1 c cranapple juice  Snk ( AM)-    L (3 PM)-  2 egg rolls, water Snk ( PM)-   D (6 PM)-  1 c beans, 1/2 c rice, 4 oz chx, 1 c cranapple juice, water Snk ( PM)-  4-5 choc almonds, water Typical day? No. Normally gets up much earlier, but had been up late Sat night studying.    Progress Towards Goal(s):  In progress.   Nutritional Diagnosis:  [place]-3.3 Overweight/obesity As related to energy balance.  As evidenced by BMI >30.    Intervention:  Nutrition education.  Handouts given during visit include:  AVS  Goals Sheet  Healthy Snacks handout  Demonstrated degree of understanding via:  Teach Back   Monitoring/Evaluation:   Dietary intake, exercise, and body weight in 4 week(s).

## 2014-06-02 ENCOUNTER — Ambulatory Visit: Payer: 59 | Admitting: Internal Medicine

## 2014-06-08 ENCOUNTER — Telehealth: Payer: Self-pay | Admitting: *Deleted

## 2014-06-08 NOTE — Telephone Encounter (Signed)
Received FMLA paperwork via fax from [employer]. Called and LM for pt to return call to let us know what condition it is for along with dates. JG//CMA

## 2014-06-09 NOTE — Telephone Encounter (Signed)
FMLA is for her husband, Jamie Patrick DOB 12/26/54. To take care of him. He had surgery last Wednesday. She is absent from her job. Date requested is 06/09/14

## 2014-06-13 ENCOUNTER — Encounter: Payer: Self-pay | Admitting: *Deleted

## 2014-06-13 ENCOUNTER — Other Ambulatory Visit: Payer: Self-pay | Admitting: Internal Medicine

## 2014-06-13 NOTE — Telephone Encounter (Signed)
Pt is requesting refill on Lexapro, not currently on med list. Last OV 01/2014. Please advise.

## 2014-06-26 ENCOUNTER — Ambulatory Visit (INDEPENDENT_AMBULATORY_CARE_PROVIDER_SITE_OTHER): Payer: 59 | Admitting: Family Medicine

## 2014-06-26 DIAGNOSIS — R7309 Other abnormal glucose: Secondary | ICD-10-CM

## 2014-06-26 DIAGNOSIS — R635 Abnormal weight gain: Secondary | ICD-10-CM

## 2014-06-26 DIAGNOSIS — R7303 Prediabetes: Secondary | ICD-10-CM

## 2014-06-26 NOTE — Progress Notes (Signed)
Medical Nutrition Therapy:  Appt start time: 1130 end time:  1230. PCP: Colon Branch, MD  Assessment:  Primary concerns today: Weight management.  Jamie Patrick has also been diagnosed with pre-DM (2014).   Jamie Patrick has not managed to meet any of her goals, although she has made some better choices for many meals.  Her husband had surgery soon after her nutrition appt, and she has felt very stressed during his recovery.  She wants to keep the same goals, however, as well as work on getting her triglycerides down.  I explained the connection between dietary sugar and refined carb's with TG and BG, and emphasized the importance of REAL (balanced) meals that include protein.    24-hr recall:  (Up at 8:15 AM) B (8:30 AM)-  1 c Fiber One, 1 c almond milk, 1 c coffee, 1 tbsp vanilla creamer Snk ( AM)-   L (12 PM)-  6 tangerines, 6 oz cranapple juice (with 6 oz water) Snk (1 PM)-  1/2 apple Snk (2 PM)-  6 oz cranapple juice (with water), 6-7 choc-covered almonds, water D (5 PM)-  Chili[employer]:  1 1/2 c fries with cheese & bacon, 1 small chx brst, 1/2 c rice, 1 c blk beans, water Snk (7:30)-  KK custard filled donut, water Snk (11 PM)-  6 tangerines  Typical day? No. Usually has lunch; only eats out once a week.    Progress Towards Goal(s):  In progress.   Nutritional Diagnosis:  [place]-3.3 Overweight/obesity As related to energy balance.  As evidenced by BMI >30.    Intervention:  Nutrition education.  Handouts given during visit include:  AVS  Goals Sheet  Demonstrated degree of understanding via:  Teach Back   Monitoring/Evaluation:  Dietary intake, exercise, and body weight in 4 week(s).

## 2014-06-26 NOTE — Patient Instructions (Addendum)
-   To better control Triglycerides (TG), you need to control the sugar and carbohydrate in your diet.  This means limiting fruit to a normal serving size (eg., 2 tangerines) AND it means limiting your juices!   Juice has a LOT of sugar and no fiber, so it spikes your blood sugar.  When your blood sugar is high, your body makes more triglycerides.   - The other way to help keep blood sugar and TG low is to make sure EACH meal is BALANCED with SOME carb, protein, veg's and/or fruit (= a REAL MEAL).   - For your carb foods (rice, bread, tortilla, potatoes, sweet potatoes, corn, cereal, muffins, donuts, pasta, etc):  Limit to two small portions per meal.     Diet Recommendations for Diabetes   Starchy (carb) foods include: Bread, rice, pasta, potatoes, corn, crackers, bagels, muffins, all baked goods.  (Fruits, milk, and yogurt also have carbohydrate, but most of these foods will not spike your blood sugar as the starchy foods will.)  A few fruits do cause high blood sugars; use small portions of bananas (limit to 1/2 at a time), grapes, watermelon, and most tropical fruits.  When you have fruit, usually ONE piece of fruit is a normal portion.    Protein foods include: Meat, fish, poultry, eggs, dairy foods, and beans such as pinto and kidney beans (beans also provide carbohydrate).   1. Eat at least 3 meals and 1-2 snacks per day. Never go more than 4-5 hours while awake without eating. Eat breakfast within the first hour of getting up.   2. Limit starchy foods to TWO per meal and ONE per snack. ONE portion of a starchy  food is equal to the following:   - ONE slice of bread (or its equivalent, such as half of a hamburger bun).   - 1/2 cup of a "scoopable" starchy food such as potatoes or rice.   - 15 grams of carbohydrate as shown on food label.  3. Include at every meal: a protein food, a carb food, and vegetables and/or fruit.   - Obtain twice as many veg's as protein or carbohydrate foods for both  lunch and dinner.   - Fresh or frozen veg's are best.   - Try to keep frozen veg's on hand for a quick vegetable serving.

## 2014-06-29 ENCOUNTER — Ambulatory Visit: Payer: 59 | Admitting: Family Medicine

## 2014-06-29 ENCOUNTER — Telehealth: Payer: Self-pay | Admitting: Internal Medicine

## 2014-06-29 DIAGNOSIS — E039 Hypothyroidism, unspecified: Secondary | ICD-10-CM

## 2014-06-29 MED ORDER — LEVOTHYROXINE SODIUM 50 MCG PO TABS: 50.0000 ug | ORAL_TABLET | Freq: Every day | ORAL | Status: DC

## 2014-06-29 NOTE — Telephone Encounter (Signed)
Changed to Synthroid 50 mcg 1 tablet daily (sent to [facility]), called Pt to inform her that Rx was sent to [facility] and that TSH will need to be rechecked in 6 weeks.

## 2014-06-29 NOTE — Telephone Encounter (Signed)
Alternate because the medication is to expensive

## 2014-06-29 NOTE — Telephone Encounter (Signed)
Is she requesting refill or needing an alternative? Thanks.

## 2014-06-29 NOTE — Telephone Encounter (Signed)
Caller name: Christia, Domke Relation to pt: self  Call back number: [phone]   Reason for call:  Pt requesting a refill ARMOUR THYROID 30 MG tablet medication is to expensive. Requesting alternate RX

## 2014-06-29 NOTE — Telephone Encounter (Signed)
Fax from the pharmacy received, Armour Thyroid not covered, needs to go back on Synthroid. Call Synthroid 50 mcg 1 tablet daily #30 and 3 refill Arrange a TSH in 6 weeks, let the patient known

## 2014-07-03 MED ORDER — LEVOTHYROXINE SODIUM 50 MCG PO TABS: 50.0000 ug | ORAL_TABLET | Freq: Every day | ORAL | Status: DC

## 2014-07-03 NOTE — Telephone Encounter (Signed)
Pt need for rx to go to [facility] states they never received the rx.

## 2014-07-03 NOTE — Addendum Note (Signed)
Addended by: Wilfrid Lund on: 07/03/2014 12:50 PM   Modules accepted: Orders

## 2014-07-03 NOTE — Telephone Encounter (Signed)
Rx resent to [facility]. Pt needs a lab appt scheduled for 6 weeks from today to recheck her TSH.

## 2014-07-20 ENCOUNTER — Ambulatory Visit (INDEPENDENT_AMBULATORY_CARE_PROVIDER_SITE_OTHER): Payer: 59 | Admitting: Family Medicine

## 2014-07-20 ENCOUNTER — Encounter: Payer: Self-pay | Admitting: Family Medicine

## 2014-07-20 VITALS — Ht <= 58 in | Wt 145.2 lb

## 2014-07-20 DIAGNOSIS — R635 Abnormal weight gain: Secondary | ICD-10-CM

## 2014-07-20 DIAGNOSIS — R7303 Prediabetes: Secondary | ICD-10-CM

## 2014-07-20 DIAGNOSIS — R7309 Other abnormal glucose: Secondary | ICD-10-CM | POA: Diagnosis not present

## 2014-07-20 NOTE — Progress Notes (Signed)
Medical Nutrition Therapy:  Appt start time: 1130 end time:  1230. PCP: Colon Branch, MD  Assessment:  Primary concerns today: Weight management.  Cl[place] has also been diagnosed with pre-DM (2014).   [place] has been off her exercise schedule some b/c of the declining health and death of her aunt (did not complete goals sheet provided last time).  She will be getting back to 1-hr Zumba on Saturdays though, and she and her husband walked for 45 min last night.  Plans to walk twice a week.  [place] has usually been getting a balanced dinner, but seldom includes veg's for lunch, and is still struggling with wanting sweets.    24-hr recall suggests intake of ~1300 kcal:  (Up at 6:20 AM) B (6:45 AM)-  1 c Fiber One, 1 c almond milk    210 Snk (8:30)-  1 c coffee, 1 tsp sugar       20 Snk (10 AM)-  2 tbsp mixture of choc chips, almonds, pnuts, dried fruit 140 L (11:30 PM)-  <1/2 avocado, 6 oz yogurt (80), unswt tea   180 Snk (3 PM)-  2 tbsp mixture of choc chips, almonds, pnuts, dried fruit 140 D (6:30 PM)-  [employer]: 3/4 c broc & chx-chs casserole, 2 small swt pot's, 1 muffin, water 550 Snk (9 PM)-  1/4 c stewed sweetened apples      50 Typical day? No. b/c seldom goes out more than once a week.    Progress Towards Goal(s):  In progress.   Nutritional Diagnosis:  [place]-3.3 Overweight/obesity As related to energy balance.  As evidenced by BMI >30.    Intervention:  Nutrition education.  Handouts given during visit include:  AVS  Demonstrated degree of understanding via:  Teach Back   Monitoring/Evaluation:  Dietary intake, exercise, and body weight in 5 week(s).

## 2014-07-20 NOTE — Patient Instructions (Addendum)
Goals: 1. Exercise:  Walk at least 30 min 2 X wk; 60 min Zumba once a week; at least 10 minutes walking 3 X wk.   2. Vegetables with both lunch and dinner.  Think about what you want to bring for lunch as you make your grocery list.    - If you don't have any fresh veg's on hand, bring a bag of frozen veg's, and keep at work.   3. Drink at least 48 oz of fluids per day, in addition to any coffee you drink.    - If you use juice, limit to 4-6 oz at a time.  You may want to try diluting with seltzer.    If you are going to use a trail mix type of snack, MAKE YOUR OWN, and use less of the sweet stuff.   Sweet stuff at night:  Consider using ONE Viactiv calcium chew.  (Check at [facility].)  FOLLOW-UP:  May 19 at 11:30; PLEASE LET ME KNOW IF YOUR HUSBAND CAN JOIN [place] FOR THAT APPT.

## 2014-08-03 ENCOUNTER — Encounter: Payer: Self-pay | Admitting: Internal Medicine

## 2014-08-03 ENCOUNTER — Ambulatory Visit (INDEPENDENT_AMBULATORY_CARE_PROVIDER_SITE_OTHER): Payer: 59 | Admitting: Internal Medicine

## 2014-08-03 VITALS — BP 126/76 | HR 73 | Temp 97.9°F | Ht <= 58 in | Wt 144.1 lb

## 2014-08-03 DIAGNOSIS — M545 Low back pain: Secondary | ICD-10-CM

## 2014-08-03 DIAGNOSIS — F419 Anxiety disorder, unspecified: Secondary | ICD-10-CM

## 2014-08-03 DIAGNOSIS — F418 Other specified anxiety disorders: Secondary | ICD-10-CM

## 2014-08-03 DIAGNOSIS — G4700 Insomnia, unspecified: Secondary | ICD-10-CM | POA: Diagnosis not present

## 2014-08-03 DIAGNOSIS — E039 Hypothyroidism, unspecified: Secondary | ICD-10-CM

## 2014-08-03 DIAGNOSIS — F329 Major depressive disorder, single episode, unspecified: Secondary | ICD-10-CM

## 2014-08-03 LAB — TSH: TSH: 1.81 u[IU]/mL (ref 0.35–4.50)

## 2014-08-03 MED ORDER — ESCITALOPRAM OXALATE 20 MG PO TABS: 40.0000 mg | ORAL_TABLET | Freq: Every day | ORAL | Status: DC

## 2014-08-03 MED ORDER — LEVOTHYROXINE SODIUM 50 MCG PO TABS: 50.0000 ug | ORAL_TABLET | Freq: Every day | ORAL | Status: DC

## 2014-08-03 NOTE — Progress Notes (Signed)
Subjective:    Patient ID: Jamie Patrick, female    DOB: 1955-09-11, 59 y.o.   MRN: [id number]  DOS:  08/03/2014 Type of visit - description : rov, here w/ husband Interval history: Since the last visit she is doing well, still having some issues at work but overall work environment has improved . Taking either Xanax or Elavil at night with ok control of her symptoms Intolerant to Ambien    Review of Systems No suicidal ideas Again complained about her weight today  Past Medical History  Diagnosis Date  . Anxiety and depression   . Hypothyroidism   . Allergic rhinitis   . CTS (carpal tunnel syndrome)     question of  . Vitamin D deficiency   . Osteopenia     as reported by pt, DEXA @ gyn  . Colon polyps     BENIGN  . Dysplasia of cervix, low grade (CIN 1)     h/o CIN-1/CIN2  . Prediabetes 09/01/2012    Past Surgical History  Procedure Laterality Date  . Cholecystectomy  2010  . Tonsillectomy    . Uterine polyps  2003    Dr.Fernadez/ RESECTOSCOPIC POLYPECTOMY  . Cryotherapy      FOR CERVICAL DYSPLASIA CIN-1/CIN-2  . Colonoscopy      repeat in 5 years  . Appendectomy      History   Social History  . Marital Status: Married    Spouse Name: N/A  . Number of Children: 0  . Years of Education: N/A   Occupational History  . 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    [employer]     Social History Main Topics  . Smoking status: Never Smoker   . Smokeless tobacco: Never Used  . Alcohol Use: No     Comment:    . Drug Use: No  . Sexual Activity: No   Other Topics Concern  . Not on file   Social History Narrative   Original from [place]-          Medication List       This list is accurate as of: 08/03/14 11:32 AM.  Always use your most recent med list.               ALPRAZolam 0.5 MG tablet  Commonly known as:  XANAX  TAKE 1 TABLET BY MOUTH ONCE DAILY AT BEDTIME AS NEEDED FOR ANXIETY OR SLEEP     amitriptyline 25 MG tablet  Commonly known as:  ELAVIL  Take 1  tablet (25 mg total) by mouth at bedtime.     CALCIUM + D PO  Take 1 tablet by mouth daily.     escitalopram 20 MG tablet  Commonly known as:  LEXAPRO  Take 2 tablets (40 mg total) by mouth daily.     fish oil-omega-3 fatty acids 1000 MG capsule  Take 2 g by mouth daily.     levothyroxine 50 MCG tablet  Commonly known as:  SYNTHROID, LEVOTHROID  Take 1 tablet (50 mcg total) by mouth daily.     lidocaine-hydrocortisone 3-0.5 % Crea  Commonly known as:  ANAMANTEL HC  Use as needed     multivitamin tablet  Take 1 tablet by mouth daily.     polyethylene glycol packet  Commonly known as:  MIRALAX / GLYCOLAX  Take 17 g by mouth as needed.           Objective:   Physical Exam BP  126/76 mmHg  Pulse 73  Temp(Src) 97.9 F (36.6 C) (Oral)  Ht 4\' 10"  (1.473 m)  Wt 144 lb 2 oz (65.375 kg)  BMI 30.13 kg/m2  SpO2 97%  LMP 02/03/2009  General:   Well developed, well nourished . NAD.  HEENT:  No thyromegaly Lungs:  CTA B Normal respiratory effort, no intercostal retractions, no accessory muscle use. Heart: RRR,  no murmur.  Muscle skeletal: no pretibial edema bilaterally  Skin: Not pale. Not jaundice Neurologic:  alert & oriented X3.  Speech normal, gait appropriate for age and unassisted Psych--  Cognition and judgment appear intact.  Cooperative with normal attention span and concentration.  Behavior appropriate. No anxious or depressed appearing.       Assessment & Plan:

## 2014-08-03 NOTE — Progress Notes (Signed)
Pre visit review using our clinic review tool, if applicable. No additional management support is needed unless otherwise documented below in the visit note. 

## 2014-08-03 NOTE — Patient Instructions (Signed)
Get your blood work before you leave  Also need a UDS     Come back to the office in 4-5 months   for a physical exam  Please schedule an appointment at the front desk    Come back fasting

## 2014-08-03 NOTE — Assessment & Plan Note (Addendum)
Relatively well controlled w/ either xanax  or Elavil at night. Check a UDS

## 2014-08-03 NOTE — Assessment & Plan Note (Signed)
Good compliance with medication, check a TSH

## 2014-08-03 NOTE — Assessment & Plan Note (Signed)
Takes occasional   Elavil at night

## 2014-08-03 NOTE — Assessment & Plan Note (Signed)
Currently on Lexapro 40 mg, feels better, she seems to be better as well. No change

## 2014-08-16 ENCOUNTER — Telehealth: Payer: Self-pay

## 2014-08-16 NOTE — Telephone Encounter (Signed)
UDS: 08/03/2014  Negative for Alprazolam: PRN Positive for Amitriptyline Negative for Nortriptyline Positive for Citalopram  Low risk per Dr. Larose Kells 08/15/2014

## 2014-08-24 ENCOUNTER — Ambulatory Visit: Payer: 59 | Admitting: Family Medicine

## 2014-08-28 ENCOUNTER — Encounter: Payer: Self-pay | Admitting: Internal Medicine

## 2014-09-12 ENCOUNTER — Encounter: Payer: Self-pay | Admitting: Internal Medicine

## 2014-09-15 ENCOUNTER — Other Ambulatory Visit: Payer: Self-pay | Admitting: Family Medicine

## 2014-09-15 ENCOUNTER — Other Ambulatory Visit: Payer: Self-pay

## 2014-09-15 DIAGNOSIS — M533 Sacrococcygeal disorders, not elsewhere classified: Secondary | ICD-10-CM

## 2014-09-15 MED ORDER — AMITRIPTYLINE HCL 25 MG PO TABS: 25.0000 mg | ORAL_TABLET | Freq: Every day | ORAL | Status: DC

## 2014-09-19 ENCOUNTER — Encounter: Payer: Self-pay | Admitting: Internal Medicine

## 2014-09-19 NOTE — Telephone Encounter (Signed)
Pt requesting to speak with medical assistant regarding my chart message

## 2014-09-19 NOTE — Telephone Encounter (Signed)
MyChart message has been sent to Dr. Larose Kells, awaiting his response.

## 2014-09-21 ENCOUNTER — Encounter: Payer: Self-pay | Admitting: Family Medicine

## 2014-09-21 ENCOUNTER — Ambulatory Visit (INDEPENDENT_AMBULATORY_CARE_PROVIDER_SITE_OTHER): Payer: 59 | Admitting: Family Medicine

## 2014-09-21 VITALS — Ht <= 58 in | Wt 141.8 lb

## 2014-09-21 DIAGNOSIS — R635 Abnormal weight gain: Secondary | ICD-10-CM

## 2014-09-21 DIAGNOSIS — R7309 Other abnormal glucose: Secondary | ICD-10-CM

## 2014-09-21 DIAGNOSIS — R7303 Prediabetes: Secondary | ICD-10-CM

## 2014-09-21 NOTE — Patient Instructions (Addendum)
-   Check your soy milk to see if it's mostly soy or if it's almond.  Best is to get ~8 grams of protein in your one cup of milk. - Congratulations for some good progress in your physical activity.    - Goals:  1. Continue to get veg's at both lunch and dinner.  2. Physical activity:  40 min bike 3 X wk & parking further away.        Is there any other way of increasing walking?  - Keep up with with the fruit and your fluids intake daily.

## 2014-09-21 NOTE — Progress Notes (Signed)
Medical Nutrition Therapy:  Appt start time: 1130 end time:  1230. PCP: Colon Branch, MD  Assessment:  Primary concerns today: Weight management.  Jamie Patrick has also been diagnosed with pre-DM (2014).    Jamie Patrick had hoped her husband would attend today, but was not able to after all.    She has been going to the gym 3 wk, doing 3 mi on the bike (40 min).  She is also trying to park further away when coming to work.  No Zumba currently b/c of time and energy constraints as she finishes her class work.  She has been eating more veg's.  Other dietary changes include drinking more water and limiting sweets, although she has been consuming 1 small square of dark chcolate on most days.    Discussed emotional eating today, prompted by yesterday's dinner, which was mainly comfort food.     24-hr recall suggests intake of ~1240 kcal:  (Up at 6:30 AM) B (6:45 AM)-  3/4 c Kell[employer]'s Raisin Bran, 1 c soymilk      240 Snk (9 AM)-  1 c coffee, 1 tsp creamer         Snk (11 AM)-  1 banana, sweet tea diluted with water     180 L (11:30 PM)-  1 small pc meat loaf, 3/4 c veg's (corn, green beans, peas), 1/2 brownie 370 Snk ( PM)-  --- D (6 PM)-  2 slc bread, 1 tbsp fake butter, 16 oz soymilk (was upset)    400 Snk (9:30)-  1+ c strawberries, water         50 Typical day? No. b/c did not eat a normal dinner, and not usually eating a brownie at work (brought in as gift).    Progress Towards Goal(s):  In progress.   Nutritional Diagnosis: Progress noted on [place]-3.3 Overweight/obesity As related to energy balance.  As evidenced by BMI reduced to below 30.    Intervention:  Nutrition education.  Handouts given during visit include:  AVS  Food Decisions Algorithm   Demonstrated degree of understanding via:  Teach Back   Monitoring/Evaluation:  Dietary intake, exercise, and body weight prn.  Will determine appt time that fits schedule.  (Would like to have husband attend.)

## 2014-09-25 ENCOUNTER — Encounter: Payer: Self-pay | Admitting: Internal Medicine

## 2014-09-26 ENCOUNTER — Other Ambulatory Visit: Payer: Self-pay

## 2014-09-26 ENCOUNTER — Ambulatory Visit (INDEPENDENT_AMBULATORY_CARE_PROVIDER_SITE_OTHER): Payer: 59 | Admitting: Family Medicine

## 2014-09-26 ENCOUNTER — Encounter: Payer: Self-pay | Admitting: Family Medicine

## 2014-09-26 VITALS — BP 132/64 | HR 79 | Temp 98.2°F

## 2014-09-26 DIAGNOSIS — R079 Chest pain, unspecified: Secondary | ICD-10-CM | POA: Diagnosis not present

## 2014-09-26 DIAGNOSIS — R0789 Other chest pain: Secondary | ICD-10-CM

## 2014-09-26 MED ORDER — GI COCKTAIL ~~LOC~~
30.0000 mL | Freq: Once | ORAL | Status: AC
  Administered 2014-09-26: 30 mL via ORAL

## 2014-09-26 MED ORDER — IBUPROFEN 200 MG PO TABS
400.0000 mg | ORAL_TABLET | Freq: Once | ORAL | Status: AC
  Administered 2014-09-26: 400 mg via ORAL

## 2014-09-26 NOTE — Patient Instructions (Addendum)
This is likely musculoskeletal, and with the point of pain on your chest, it is probably costochondritis.  If you develop trouble breathing, dizziness, sweats, nausea, or pressure on your chest or other major concerns, seek immediate re-evaluation. Otherwise, take ibuprofen or tylenol no more than 2400mg  daily, only as needed. Also try a heating pad to your chest. It may be worthwhile to have your anxiety re-evaluated as this can certainly lead to pain of this sort. It could also have come about totally randomly or due to a recent viral illness if you had one.  Best,  Hilton Sinclair, MD  Costocondritis (Costochondritis) La costocondritis es la hinchazn e irritacin del tejido (cartlago) que une las costillas con el hueso del trax (esternn). Esto causa dolor en el pecho y la zona de las [place]. Generalmente desaparece con [employer], sin tratamiento. CUIDADOS EN EL HOGAR  Evite las actividades que lo cansen [place].  No esfuerce sus costillas. Evite las [employer] que tenga que emplear:  [place].  El vientre.  [employer].  Aplique hielo en la zona durante los 2 primeros das despus del comienzo del dolor.  Ponga el hielo en una bolsa plstica.  Colquese una toalla entre la piel y la bolsa de hielo.  Deje el hielo durante 20 minutos, y aplquelo 2-3 veces por Training and development officer.  Slo tome los medicamentos que le haya indicado su mdico. SOLICITE AYUDA SI:  Tiene enrojecimiento e inflamacin (hinchazn) en la zona de las costillas.  El dolor no desaparece aunque haga reposo o tome medicamentos. SOLICITE AYUDA DE INMEDIATO SI:   El dolor empeora.  Siente muchas molestias.  Tiene dificultad para respirar.  Tose y escupe sangre.  Comienza a devolver (vomita).  Tiene fiebre o sntomas que persisten durante ms de 2-3 das.  Tiene fiebre y los sntomas empeoran repentinamente. ASEGRESE DE QUE:   Comprende estas instrucciones.  Controlar su  afeccin.  Recibir ayuda de inmediato si no mejora o si empeora. Document Released: 04/26/2010 Document Revised: 11/24/2012 [facility] Patient Information 2015 [place]. This information is not intended to replace advice given to you by your health care provider. Make sure you discuss any questions you have with your health care provider.

## 2014-09-26 NOTE — Progress Notes (Signed)
Patient ID: Jamie Patrick, female   DOB: 09-14-1955, 59 y.o.   MRN: [id number] Subjective:   CC: Chest pain  HPI:   This is a SDA visit for chest pain that patient reports began 30 minutes ago as she was walking to make copies (receptionist). Pain was left-sided and sharp and is still present. It was severe. Denies pressure, nausea, diaphoresis, dizziness, dyspnea, or abdominal pain. No h/o similar symptoms. She has had a headache for 2 weeks using daily ibuprofen 400mg  but with no reflux symptoms. No h/o cardiac problems, tobacco use, alcohol or drug use. Drinks 1 cup coffee daily but no other caffeine and no soda. Denies rash. Denies trauma or recent heavy lifting. No new medications other than nyquil and dayquil last night/today. 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   Review of Systems - Per HPI. Additionally, 2 weeks of frontal headache with no change in gait, vision, speech, or strength. Daily ibuprofen 400mg  x 2 weeks. Currently not present.  PMH - weight gain, hepatitis carrier, prediabetes, syncope hx, right SI joint pain, osteopenia, neck pain, insomnia, hypothyroidism, back ache, anxiety/depression, allergic rhinitis  Daily medications: Synthroid, lexapro, amitriptyline Smoking status: Nonsmoker, no h/o alcohol or drug use    Objective:  Physical Exam BP 132/64 mmHg  Pulse 79  Temp(Src) 98.2 F (36.8 C) (Oral)  SpO2 98%  LMP 02/03/2009 GEN: NAD CV: RRR, no m/r/g PULM: CTAB, normal effort, no wheezing or crackles SKIN: No rash or cyanosis, no skin changes over area on chest of tenderness CHEST: TTP left costochondral margin with sternum EXTR: No LE edema or calf tenderness ABD: S/NT/ND, nabs NEURO: Grossly normal, normal speech and gait PSYCH: Mood and affect mildly anxious HEENT: AT/[place], sclera clear, EOMI, TMs clear bilaterally, neck supple, no LAD, no thyromegaly, o/p clear, MMM  EKG - NSR with unchanged T wave inversions in V1 from that of 01/2014  and 2012     Assessment:     Jamie Patrick is a 59 y.o. female here for chest pain.    Plan:     # See problem list and after visit summary for problem-specific plans.   # Health Maintenance: Not discussed  Follow-up: Follow up with PCP in 1 week for f/u of chest pain.   Hilton Sinclair, MD [place]

## 2014-09-26 NOTE — Assessment & Plan Note (Addendum)
Chest pain that is atypical for cardiovascular cause and typical for MSK with reproducibility on exam, consistent with costochondritis. No fevers, dyspnea, vital sign abnormalities, and normal exam. EKG stable from previous with unchanged T wave inversions in V1 (compared to 01/2014 and 2012). Not consistent with GI process given no epigastric TTP - Conservative management with rest, heating pad, tylenol PRN. - Return precautions reviewed in AVS. - Re-eval / regular f/u for anxiety as possible contributor. Also if persistent, consider PPI. - PCP to be sure following risk stratification labs (A1c, lipid panel) - will not order in effort to not duplicate anything already done. Will fwd note to PCP. - Seen by Dr Lindell Noe who agrees with a/p. **ADDENDUM** - Seen 1 hour after given ibuprofen, with some resolution in symptoms. Later in the day, return of pain and given GI cocktail, with resolution of symptoms. Asked pt to refrain from ibuprofen due to this and use tylenol PRN instead.

## 2014-09-27 ENCOUNTER — Encounter: Payer: Self-pay | Admitting: Internal Medicine

## 2014-09-27 ENCOUNTER — Ambulatory Visit (INDEPENDENT_AMBULATORY_CARE_PROVIDER_SITE_OTHER): Payer: 59 | Admitting: Internal Medicine

## 2014-09-27 VITALS — BP 116/64 | HR 73 | Temp 98.2°F | Ht <= 58 in | Wt 141.2 lb

## 2014-09-27 DIAGNOSIS — F418 Other specified anxiety disorders: Secondary | ICD-10-CM

## 2014-09-27 DIAGNOSIS — G4700 Insomnia, unspecified: Secondary | ICD-10-CM | POA: Diagnosis not present

## 2014-09-27 DIAGNOSIS — F329 Major depressive disorder, single episode, unspecified: Secondary | ICD-10-CM

## 2014-09-27 DIAGNOSIS — R0789 Other chest pain: Secondary | ICD-10-CM | POA: Diagnosis not present

## 2014-09-27 DIAGNOSIS — F419 Anxiety disorder, unspecified: Secondary | ICD-10-CM

## 2014-09-27 DIAGNOSIS — F32A Depression, unspecified: Secondary | ICD-10-CM

## 2014-09-27 NOTE — Progress Notes (Signed)
Pre visit review using our clinic review tool, if applicable. No additional management support is needed unless otherwise documented below in the visit note. 

## 2014-09-27 NOTE — Progress Notes (Signed)
Subjective:    Patient ID: Jamie Patrick, female    DOB: 03-26-1956, 58 y.o.   MRN: [id number]  DOS:  09/27/2014 Type of visit - description : acute Interval history: Yesterday was at work, not exercising, developed sharp, anterior chest pain without radiation, no associated nausea, vomiting, difficulty breathing or diaphoresis. 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 Shortly after the pain resurface and a GI cocktail make it better. No further chest pain since then. On looking back in the last few weeks, she has been active doing a stationary bike without any symptoms. Yesterday she was felt to have costochondritis as she was TTP at the chest wall.  Also having on and off headaches for 2 weeks, mild, frontal, not the worst of her life, decrease with ibuprofen and rest.    Review of Systems  Anxiety and insomnia well controlled. Denies fever chills No lower extremity edema or palpitations No epigastric pain No airplane trip or prolonged car trip.   Past Medical History  Diagnosis Date  . Anxiety and depression   . Hypothyroidism   . Allergic rhinitis   . CTS (carpal tunnel syndrome)     question of  . Vitamin D deficiency   . Osteopenia     as reported by pt, DEXA @ gyn  . Colon polyps     BENIGN  . Dysplasia of cervix, low grade (CIN 1)     h/o CIN-1/CIN2  . Prediabetes 09/01/2012    Past Surgical History  Procedure Laterality Date  . Cholecystectomy  2010  . Tonsillectomy    . Uterine polyps  2003    Dr.Fernadez/ RESECTOSCOPIC POLYPECTOMY  . Cryotherapy      FOR CERVICAL DYSPLASIA CIN-1/CIN-2  . Colonoscopy      repeat in 5 years  . Appendectomy      History   Social History  . Marital Status: Married    Spouse Name: N/A  . Number of Children: 0  . Years of Education: N/A   Occupational History  . 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    [employer]     Social History Main Topics  .  Smoking status: Never Smoker   . Smokeless tobacco: Never Used  . Alcohol Use: No     Comment:    . Drug Use: No  . Sexual Activity: No   Other Topics Concern  . Not on file   Social History Narrative   Original from [place]-          Medication List       This list is accurate as of: 09/27/14 11:59 PM.  Always use your most recent med list.               amitriptyline 25 MG tablet  Commonly known as:  ELAVIL  Take 1 tablet (25 mg total) by mouth at bedtime.     CALCIUM + D PO  Take 1 tablet by mouth daily.     escitalopram 20 MG tablet  Commonly known as:  LEXAPRO  Take 20 mg by mouth daily.     fish oil-omega-3 fatty acids 1000 MG capsule  Take 2 g by mouth daily.     levothyroxine 50 MCG tablet  Commonly known as:  SYNTHROID, LEVOTHROID  Take 1 tablet (50 mcg total) by mouth daily.     lidocaine-hydrocortisone 3-0.5 % Crea  Commonly known  as:  ANAMANTEL HC  Use as needed     multivitamin tablet  Take 1 tablet by mouth daily.     polyethylene glycol packet  Commonly known as:  MIRALAX / GLYCOLAX  Take 17 g by mouth as needed.           Objective:   Physical Exam BP 116/64 mmHg  Pulse 73  Temp(Src) 98.2 F (36.8 C) (Oral)  Ht 4\' 10"  (1.473 m)  Wt 141 lb 4 oz (64.071 kg)  BMI 29.53 kg/m2  SpO2 97%  LMP 02/03/2009 General:   Well developed, well nourished . NAD.  HEENT:  Normocephalic . Face symmetric, atraumatic Lungs:  CTA B Normal respiratory effort, no intercostal retractions, no accessory muscle use. Chest wall: Slightly TTP frontal chest wall Heart: RRR,  no murmur.  no pretibial edema bilaterally  Abdomen:  Not distended, soft, non-tender. No rebound or rigidity.   Skin: Not pale. Not jaundice Neurologic:  alert & oriented X3.  Speech normal, gait appropriate for age and unassisted [place] and judgment appear intact.  Cooperative with normal attention span and concentration.  Behavior appropriate. No anxious  or depressed appearing.       Assessment & Plan:    Mild headache on and off, recommend tylenol instead of ibuprofen and observation.

## 2014-09-27 NOTE — Assessment & Plan Note (Signed)
Currently not taking Xanax, on amitriptyline only and doing well

## 2014-09-27 NOTE — Assessment & Plan Note (Signed)
Reports symptoms are currently well controlled, she even decreased Lexapro to 20 mg one tablet daily. Plan: No change

## 2014-09-27 NOTE — Patient Instructions (Signed)
Tylenol  500 mg OTC 2 tabs a day every 8 hours as needed for pain  Please call if you have severe, different  or persistent headaches or chest pains.

## 2014-09-27 NOTE — Assessment & Plan Note (Signed)
Had an episode of chest pain yesterday, on clinical grounds not likely cardiac, MSK? GERD?Marland Kitchen She is asymptomatic today, recommend observation for now.

## 2014-09-29 ENCOUNTER — Ambulatory Visit: Payer: 59 | Admitting: Family Medicine

## 2014-11-24 ENCOUNTER — Telehealth: Payer: Self-pay | Admitting: Internal Medicine

## 2014-11-24 NOTE — Telephone Encounter (Signed)
pre visit letter [facility] 11/24/14

## 2014-12-04 ENCOUNTER — Encounter: Payer: Self-pay | Admitting: Family Medicine

## 2014-12-04 ENCOUNTER — Ambulatory Visit (INDEPENDENT_AMBULATORY_CARE_PROVIDER_SITE_OTHER): Payer: 59 | Admitting: Family Medicine

## 2014-12-04 VITALS — BP 110/91 | HR 97 | Ht <= 58 in | Wt 146.0 lb

## 2014-12-04 DIAGNOSIS — M25551 Pain in right hip: Secondary | ICD-10-CM

## 2014-12-04 MED ORDER — MELOXICAM 15 MG PO TABS: 15.0000 mg | ORAL_TABLET | Freq: Every day | ORAL | Status: DC

## 2014-12-04 NOTE — Patient Instructions (Signed)
Please start taking one pill of meloxicam daily for inflammation. DO NOT take ibuprofen or advil with this--you CAN take tylenol with this. Please go by outpatient radiology and get the films done I will talk with you about the x rays and the next step after I get the x rays back.

## 2014-12-05 ENCOUNTER — Ambulatory Visit (HOSPITAL_COMMUNITY)
Admission: RE | Admit: 2014-12-05 | Discharge: 2014-12-05 | Disposition: A | Discharge status: Home / Self Care | Payer: 59 | Source: Ambulatory Visit | Attending: Family Medicine | Admitting: Family Medicine

## 2014-12-05 DIAGNOSIS — M25551 Pain in right hip: Secondary | ICD-10-CM | POA: Insufficient documentation

## 2014-12-05 NOTE — Progress Notes (Signed)
Patient ID: Jamie Patrick, female   DOB: 1955-10-31, 59 y.o.   MRN: [id number]  Jamie Patrick - 59 y.o. female MRN [id number]  Date of birth: November 13, 1955    SUBJECTIVE:     Right anterior hip pain. Has had [place] with this before about 8 years ago but it improved. In the last 2 months the pain is returned and increased in frequency as well as severity. Now the pain is constant, 6-8 out of 10 at its worst. Worse with standing, prolonged sitting, walking. She's having to modify some of her daily activities of walking because of the pain. She's quite worried about this.  Several years ago she had similar type hip pain after she danced for a long time at a wedding reception. She's not had any other specific injury to her hip. ROS:     No unusual weight change. No abdominal pain, no change in bowel or bladder habits. No swelling of the hip. As noted no erythema or warmth of the hip.  PERTINENT  PMH / PSH FH / / SH:  Past Medical, Surgical, Social, and Family History Reviewed & Updated in the EMR.  Pertinent findings include:  History of thyroid disorder, osteopenia, prediabetes anxiety and depression, unspecified viral hepatitis carrier.  OBJECTIVE: BP 110/91 mmHg  Pulse 97  Ht 4\' 10"  (1.473 m)  Wt 146 lb (66.225 kg)  BMI 30.52 kg/m2  LMP 02/03/2009  Physical Exam:  Vital signs are reviewed. GEN.: Well-developed female no acute distress HIPS: Internal/external rotation bilaterally is full and painless. She has normal flexion and extension at the hip with normal strength of hip flexors and hip extensors. Tender to palpation over the anterior hip in a very specific area and this reproduces her pain. So as test negative. KNEE: The knee bilaterally has full range of motion in flexion extension ABDOMEN: Soft, no rebound and no guarding no masses. ASSESSMENT & PLAN:  Right anterior hip pain. Seems very focal. I wonder if this is similar to a hip pointer. I do think we should get films before we proceed  further. When she has her x-rays I'll discuss with her. Could consider injection under ultrasound guidance if we see nothing on the films. We'll also start her on anti-inflammatory we discussed red flags such as stomach upset for that.

## 2014-12-07 ENCOUNTER — Ambulatory Visit: Payer: 59 | Admitting: Family Medicine

## 2014-12-07 ENCOUNTER — Encounter: Payer: Self-pay | Admitting: Family Medicine

## 2014-12-13 ENCOUNTER — Telehealth: Payer: Self-pay | Admitting: Behavioral Health

## 2014-12-13 NOTE — Telephone Encounter (Signed)
Unable to reach patient at time of Pre-Visit Call.  Left message for patient to return call when available.    

## 2014-12-15 ENCOUNTER — Ambulatory Visit: Payer: 59 | Admitting: Family Medicine

## 2014-12-15 ENCOUNTER — Ambulatory Visit (INDEPENDENT_AMBULATORY_CARE_PROVIDER_SITE_OTHER): Payer: 59 | Admitting: Internal Medicine

## 2014-12-15 ENCOUNTER — Ambulatory Visit (INDEPENDENT_AMBULATORY_CARE_PROVIDER_SITE_OTHER): Payer: 59 | Admitting: Family Medicine

## 2014-12-15 ENCOUNTER — Encounter: Payer: Self-pay | Admitting: Internal Medicine

## 2014-12-15 VITALS — BP 122/64 | HR 87 | Temp 98.5°F | Ht <= 58 in | Wt 143.4 lb

## 2014-12-15 VITALS — Ht <= 58 in | Wt 145.0 lb

## 2014-12-15 DIAGNOSIS — G4700 Insomnia, unspecified: Secondary | ICD-10-CM | POA: Diagnosis not present

## 2014-12-15 DIAGNOSIS — Z0000 Encounter for general adult medical examination without abnormal findings: Secondary | ICD-10-CM | POA: Diagnosis not present

## 2014-12-15 DIAGNOSIS — M25551 Pain in right hip: Secondary | ICD-10-CM | POA: Diagnosis not present

## 2014-12-15 DIAGNOSIS — Z09 Encounter for follow-up examination after completed treatment for conditions other than malignant neoplasm: Secondary | ICD-10-CM

## 2014-12-15 DIAGNOSIS — Z114 Encounter for screening for human immunodeficiency virus [HIV]: Secondary | ICD-10-CM

## 2014-12-15 LAB — LIPID PANEL
CHOL/HDL RATIO: 4
CHOLESTEROL: 206 mg/dL — AB (ref 0–200)
HDL: 48.1 mg/dL (ref >39.00)
LDL Cholesterol: 145 mg/dL — ABNORMAL HIGH (ref 0–99)
NonHDL: 158.31
TRIGLYCERIDES: 66 mg/dL (ref 0.0–149.0)
VLDL: 13.2 mg/dL (ref 0.0–40.0)

## 2014-12-15 LAB — BASIC METABOLIC PANEL
BUN: 12 mg/dL (ref 6–23)
CO2: 24 meq/L (ref 19–32)
CREATININE: 0.56 mg/dL (ref 0.40–1.20)
Calcium: 9.5 mg/dL (ref 8.4–10.5)
Chloride: 104 mEq/L (ref 96–112)
GFR: 118.13 mL/min (ref >60.00)
Glucose, Bld: 133 mg/dL — ABNORMAL HIGH (ref 70–99)
Potassium: 4.1 mEq/L (ref 3.5–5.1)
Sodium: 137 mEq/L (ref 135–145)

## 2014-12-15 LAB — AST: AST: 34 U/L (ref 0–37)

## 2014-12-15 LAB — ALT: ALT: 37 U/L — ABNORMAL HIGH (ref 0–35)

## 2014-12-15 MED ORDER — ALPRAZOLAM 0.5 MG PO TABS: 0.5000 mg | ORAL_TABLET | Freq: Every evening | ORAL | Status: DC | PRN

## 2014-12-15 NOTE — Progress Notes (Signed)
Patient ID: Jamie Patrick, female   DOB: [birth year], 59 y.o.   MRN: [id number] Follow-up right anterior hip pain. We had gotten her x-rays back and they were negative. I had talked with her at last office visit about possible soft tissue  injection as this seems to be localized inflammatory reaction. Under ultrasound today I looked at her anterior hip and frown area of small pseudo-bursa.  INJECTION: Patient was given informed consent, signed copy in the chart. Appropriate time out was taken. Area prepped and draped in usual sterile fashion. 1 cc of methylprednisolone 40 mg/ml plus  2 pseudo-bursa of the right anterior hip cc of 1% lidocaine without epinephrine was injected into thearea of pseudo bursa a(n) ultraound guided  approach. The patient tolerated the procedure well. There were no complications. Post procedure instructions were given.

## 2014-12-15 NOTE — Assessment & Plan Note (Signed)
Insomnia: Currently on Elavil, not working, in the past xanax helped. Switch back to xanax Psych: symptoms currently well controlled with Lexapro Allergies: mucus in the throat  most mornings, recommend Flonase.

## 2014-12-15 NOTE — Progress Notes (Signed)
Subjective:    Patient ID: Jamie Patrick, female    DOB: Dec 15, 1955, 59 y.o.   MRN: [id number]  DOS:  12/15/2014 Type of visit - description : CPX, here with her husband Interval history: Recently had hip pain, follow-up by sports medicine  Review of Systems Constitutional: No fever. No chills. No unexplained wt changes. usually sweats significantly when she is anxious  HEENT: No dental problems, no ear discharge, no facial swelling, no voice changes. No eye discharge, no eye  redness , no  intolerance to light . Most mornings has mucus accumulation at the throat, denies postnasal dripping  Respiratory: No wheezing , no  difficulty breathing. No cough , no mucus production  Cardiovascular: No CP, no leg swelling , no  Palpitations  GI: no nausea, no vomiting, no diarrhea , no  abdominal pain.  No blood in the stools. No dysphagia, no odynophagia    Endocrine: No polyphagia, no polyuria , no polydipsia  GU: No dysuria, gross hematuria, difficulty urinating. No urinary urgency, no frequency.  Musculoskeletal: No joint swellings or unusual aches or pains  Skin: No change in the color of the skin, palor , no  Rash  Allergic, immunologic: No environmental allergies , no  food allergies  Neurological: No dizziness no  syncope. Had some headaches   2 weeks ago, mild. Resolved.  No diplopia, no slurred, no slurred speech, no motor deficits, no facial  Numbness  Hematological: No enlarged lymph nodes, no easy bruising , no unusual bleedings  Psychiatry: No suicidal ideas, no hallucinations, no beavior problems, no confusion.  No unusual/severe anxiety, no depression   Past Medical History  Diagnosis Date  . Anxiety and depression   . Hypothyroidism   . Allergic rhinitis   . CTS (carpal tunnel syndrome)     question of  . Vitamin D deficiency   . Osteopenia     as reported by pt, DEXA @ gyn  . Colon polyps     BENIGN  . Dysplasia of cervix, low grade (CIN 1)     h/o  CIN-1/CIN2  . Prediabetes 09/01/2012    Past Surgical History  Procedure Laterality Date  . Cholecystectomy  2010  . Tonsillectomy    . Uterine polyps  2003    Dr.Fernadez/ RESECTOSCOPIC POLYPECTOMY  . Cryotherapy      FOR CERVICAL DYSPLASIA CIN-1/CIN-2  . Colonoscopy      repeat in 5 years  . Appendectomy      Social History   Social History  . Marital Status: Married    Spouse Name: N/A  . Number of Children: 0  . Years of Education: N/A   Occupational History  . 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    [employer]land [employer]     Social History Main Topics  . Smoking status: Never Smoker   . Smokeless tobacco: Never Used  . Alcohol Use: No     Comment:    . Drug Use: No  . Sexual Activity: No   Other Topics Concern  . Not on file   Social History Narrative   Original from [place]-       Family History  Problem Relation Age of Onset  . Diabetes Brother     uncles, GM  . Hypertension Brother   . Heart attack Neg Hx   . Colon cancer Neg Hx   . Breast cancer Other     distant cousins  . Osteoporosis Sister  2 sisters       Medication List       This list is accurate as of: 12/15/14  5:14 PM.  Always use your most recent med list.               ALPRAZolam 0.5 MG tablet  Commonly known as:  XANAX  Take 1 tablet (0.5 mg total) by mouth at bedtime as needed for sleep.     CALCIUM + D PO  Take 1 tablet by mouth daily.     escitalopram 20 MG tablet  Commonly known as:  LEXAPRO  Take 20 mg by mouth daily.     fish oil-omega-3 fatty acids 1000 MG capsule  Take 2 g by mouth daily.     levothyroxine 50 MCG tablet  Commonly known as:  SYNTHROID, LEVOTHROID  Take 1 tablet (50 mcg total) by mouth daily.     lidocaine-hydrocortisone 3-0.5 % Crea  Commonly known as:  ANAMANTEL HC  Use as needed     meloxicam 15 MG tablet  Commonly known as:  MOBIC  Take 1 tablet (15 mg total) by mouth daily.     multivitamin tablet  Take 1 tablet by mouth daily.     polyethylene  glycol packet  Commonly known as:  MIRALAX / GLYCOLAX  Take 17 g by mouth as needed.           Objective:   Physical Exam BP 122/64 mmHg  Pulse 87  Temp(Src) 98.5 F (36.9 C) (Oral)  Ht 4\' 10"  (1.473 m)  Wt 143 lb 6 oz (65.034 kg)  BMI 29.97 kg/m2  SpO2 93%  LMP 02/03/2009 General:   Well developed, well nourished . NAD.  Neck:  no  thyromegaly   HEENT:  Normocephalic . Face symmetric, atraumatic. Throat symmetric, no red. Nose not congested Lungs:  CTA B Normal respiratory effort, no intercostal retractions, no accessory muscle use. Heart: RRR,  no murmur.  No pretibial edema bilaterally  Abdomen:  Not distended, soft, non-tender. No rebound or rigidity.  Skin: Exposed areas without rash. Not pale. Not jaundice Neurologic:  alert & oriented X3.  Speech normal, gait appropriate for age and unassisted Strength symmetric and appropriate for age.  Psych: Cognition and judgment appear intact.  Cooperative with normal attention span and concentration.  Behavior appropriate. No anxious or depressed appearing.    Assessment & Plan:   Problem List > Prediabetes Hypothyroidism Anxiety , depression , insomnia  Intolerant to Abilify,Viibrid, used to see Dr Dorothey Baseman xanax, trazadone (didn't work), elavil (stopped working), Microbiologist (++s/e) Allergic rhinitis Osteopenia -- per gyn Vitamin D deficiency Menopausal  h/o dysplasia of the cervix CN1/CN2     A/P Insomnia: Currently on Elavil, not working, in the past xanax helped. Switch back to xanax Psych: symptoms currently well controlled with Lexapro Allergies: mucus in the throat  most mornings, recommend Flonase.

## 2014-12-15 NOTE — Progress Notes (Signed)
Pre visit review using our clinic review tool, if applicable. No additional management support is needed unless otherwise documented below in the visit note. 

## 2014-12-15 NOTE — Patient Instructions (Signed)
Get your blood work before you leave   Stop amitriptyline, start a Xanax at bedtime  Flonase 2 sprays in each side of the nose every night    Next visit  for a follow-up in 6 months, 15 minutes Please schedule an appointment at the front desk Fasting is optional

## 2014-12-15 NOTE — Assessment & Plan Note (Addendum)
Td 2007  ; zostavax-- pt's husband immunosuppressed, unable to take ; will get flu shot at work Female care per gyn Elon Alas) [phone]  MMG (-) Cscope Dr Collene Mares, aprox 2006 and again 04-2010, next in 5 years per report   counseled ref diet - exercise  Labs BMP LFTs chol  HIV

## 2014-12-16 LAB — HIV ANTIBODY (ROUTINE TESTING W REFLEX): HIV: NONREACTIVE

## 2014-12-22 ENCOUNTER — Encounter: Payer: Self-pay | Admitting: Family Medicine

## 2015-01-03 ENCOUNTER — Emergency Department (HOSPITAL_COMMUNITY)
Admission: EM | Admit: 2015-01-03 | Discharge: 2015-01-03 | Disposition: A | Discharge status: Home / Self Care | Payer: 59 | Source: Home / Self Care | Attending: Family Medicine | Admitting: Family Medicine

## 2015-01-03 ENCOUNTER — Encounter (HOSPITAL_COMMUNITY): Payer: Self-pay | Admitting: Family Medicine

## 2015-01-03 DIAGNOSIS — J069 Acute upper respiratory infection, unspecified: Secondary | ICD-10-CM

## 2015-01-03 MED ORDER — IPRATROPIUM BROMIDE 0.06 % NA SOLN: 2.0000 | Freq: Four times a day (QID) | NASAL | Status: DC

## 2015-01-03 MED ORDER — FLUTICASONE PROPIONATE 50 MCG/ACT NA SUSP: 2.0000 | Freq: Every day | NASAL | Status: DC

## 2015-01-03 MED ORDER — GUAIFENESIN-CODEINE 100-10 MG/5ML PO SOLN: 5.0000 mL | Freq: Four times a day (QID) | ORAL | Status: DC | PRN

## 2015-01-03 NOTE — ED Notes (Signed)
Pt has been having URI symptoms for 4-5 days including sinus pain and congestion, fatigue, sore throat, cough, chest pain from the cough, and greenish-yellow phlegm.  Pt denies a fever but does report some chills and sweats.

## 2015-01-03 NOTE — Discharge Instructions (Signed)
Her symptoms are likely due to a viral infection. Please start using ibuprofen 600 mg every 6-8 hours for pain and inflammation, Mucinex to help break up mucus secretions, Robitussin-AC for cough symptoms and as a sleep aid, nasal Atrovent to dry nasal secretions, Flonase at night before bed to help with sinus pain and pressure, and an nightly allergy medicine such as Zyrtec. Her symptoms should resolve over the next 1-3 days.

## 2015-01-03 NOTE — ED Provider Notes (Signed)
CSN: [id number]     Arrival date & time 01/03/15  1606 History   First MD Initiated Contact with Patient 01/03/15 1729     Chief Complaint  Patient presents with  . URI   (Consider location/radiation/quality/duration/timing/severity/associated sxs/prior Treatment) HPI  Post nasal drip, rhinorrhea, mild cough and throat irritation. Started boxing 4 days ago. Associated with occasional. Phlegm production. No sick contacts. Described chills but no fevers. Alka-Seltzer with improvement. Occasionally patient states that she has some chest discomfort with bilateral coughing. Denies palpitations, shortness of breath, wheezing, nausea, vomiting, diarrhea. Symptoms are intermittent, worse at night.    Past Medical History  Diagnosis Date  . Anxiety and depression   . Hypothyroidism   . Allergic rhinitis   . CTS (carpal tunnel syndrome)     question of  . Vitamin D deficiency   . Osteopenia     as reported by pt, DEXA @ gyn  . Colon polyps     BENIGN  . Dysplasia of cervix, low grade (CIN 1)     h/o CIN-1/CIN2  . Prediabetes 09/01/2012   Past Surgical History  Procedure Laterality Date  . Cholecystectomy  2010  . Tonsillectomy    . Uterine polyps  2003    Dr.Fernadez/ RESECTOSCOPIC POLYPECTOMY  . Cryotherapy      FOR CERVICAL DYSPLASIA CIN-1/CIN-2  . Colonoscopy      repeat in 5 years  . Appendectomy     Family History  Problem Relation Age of Onset  . Diabetes Brother     uncles, GM  . Hypertension Brother   . Heart attack Neg Hx   . Colon cancer Neg Hx   . Breast cancer Other     distant cousins  . Osteoporosis Sister     2 sisters    Social History  Substance Use Topics  . Smoking status: Never Smoker   . Smokeless tobacco: Never Used  . Alcohol Use: No     Comment:     OB History    Gravida Para Term Preterm AB TAB SAB Ectopic Multiple Living   0              Review of Systems Per HPI with all other pertinent systems negative.    Allergies  Ceftriaxone  sodium; Cephalosporins; and Sulfonamide derivatives  Home Medications   Prior to Admission medications   Medication Sig Start Date End Date Taking? Authorizing Provider  ALPRAZolam Duanne Moron) 0.5 MG tablet Take 1 tablet (0.5 mg total) by mouth at bedtime as needed for sleep. 12/15/14   Colon Branch, MD  Calcium Carbonate-Vitamin D (CALCIUM + D PO) Take 1 tablet by mouth daily.     Historical Provider, MD  escitalopram (LEXAPRO) 20 MG tablet Take 20 mg by mouth daily.    Historical Provider, MD  fish oil-omega-3 fatty acids 1000 MG capsule Take 2 g by mouth daily.    Historical Provider, MD  fluticasone (FLONASE) 50 MCG/ACT nasal spray Place 2 sprays into both nostrils at bedtime. 01/03/15   Waldemar Dickens, MD  guaiFENesin-codeine 100-10 MG/5ML syrup Take 5-10 mLs by mouth every 6 (six) hours as needed for cough. 01/03/15   Waldemar Dickens, MD  ipratropium (ATROVENT) 0.06 % nasal spray Place 2 sprays into both nostrils 4 (four) times daily. 01/03/15   Waldemar Dickens, MD  levothyroxine (SYNTHROID, LEVOTHROID) 50 MCG tablet Take 1 tablet (50 mcg total) by mouth daily. 08/03/14   Colon Branch, MD  Multiple Vitamin (MULTIVITAMIN)  tablet Take 1 tablet by mouth daily.    Historical Provider, MD  polyethylene glycol (MIRALAX / GLYCOLAX) packet Take 17 g by mouth as needed.    Historical Provider, MD   Meds Ordered and Administered this Visit  Medications - No data to display  BP 129/78 mmHg  Pulse 88  Temp(Src) 97.1 F (36.2 C) (Oral)  Resp 18  SpO2 96%  LMP 02/03/2009 No data found.   Physical Exam Physical Exam  Constitutional: oriented to person, place, and time. appears well-developed and well-nourished. No distress.  HENT:  Head: Normocephalic and atraumatic.  Left-sided swollen boggy nasal turbinates Eyes: EOMI. PERRL.  Neck: Normal range of motion.  Cardiovascular: RRR, no m/r/g, 2+ distal pulses,  Pulmonary/Chest: Effort normal and breath sounds normal. No respiratory distress.   Abdominal: Soft. Bowel sounds are normal. NonTTP, no distension.  Musculoskeletal: Normal range of motion. Non ttp, no effusion.  Neurological: alert and oriented to person, place, and time.  Skin: Skin is warm. No rash noted. non diaphoretic.  Psychiatric: normal mood and affect. behavior is normal. Judgment and thought content normal.   ED Course  Procedures (including critical care time)  Labs Review Labs Reviewed - No data to display  Imaging Review No results found.   Visual Acuity Review  Right Eye Distance:   Left Eye Distance:   Bilateral Distance:    Right Eye Near:   Left Eye Near:    Bilateral Near:         MDM   1. Viral URI    Postnasal drip causing injury patient's symptoms. Nasal saline, nasal Atrovent, Flonase, Zyrtec, NSAIDs. Robitussin-AC at night No need for antibiotic at this time.    Waldemar Dickens, MD 01/03/15 [phone]

## 2015-01-23 ENCOUNTER — Encounter: Payer: Self-pay | Admitting: Internal Medicine

## 2015-01-23 ENCOUNTER — Other Ambulatory Visit: Payer: Self-pay | Admitting: Internal Medicine

## 2015-02-02 ENCOUNTER — Other Ambulatory Visit: Payer: Self-pay

## 2015-02-02 DIAGNOSIS — Z1231 Encounter for screening mammogram for malignant neoplasm of breast: Secondary | ICD-10-CM

## 2015-02-07 ENCOUNTER — Encounter: Payer: 59 | Admitting: Internal Medicine

## 2015-02-14 ENCOUNTER — Encounter: Payer: Self-pay | Admitting: Internal Medicine

## 2015-02-17 ENCOUNTER — Emergency Department (HOSPITAL_COMMUNITY)
Admission: EM | Admit: 2015-02-17 | Discharge: 2015-02-17 | Disposition: A | Discharge status: Home / Self Care | Payer: 59 | Source: Home / Self Care | Attending: Family Medicine | Admitting: Family Medicine

## 2015-02-17 ENCOUNTER — Encounter (HOSPITAL_COMMUNITY): Payer: Self-pay | Admitting: Emergency Medicine

## 2015-02-17 DIAGNOSIS — K529 Noninfective gastroenteritis and colitis, unspecified: Secondary | ICD-10-CM | POA: Diagnosis not present

## 2015-02-17 LAB — POCT I-STAT, CHEM 8
BUN: 15 mg/dL (ref 6–20)
CHLORIDE: 102 mmol/L (ref 101–111)
Calcium, Ion: 1.15 mmol/L (ref 1.12–1.23)
Creatinine, Ser: 0.6 mg/dL (ref 0.44–1.00)
Glucose, Bld: 111 mg/dL — ABNORMAL HIGH (ref 65–99)
HEMATOCRIT: 37 % (ref 36.0–46.0)
HEMOGLOBIN: 12.6 g/dL (ref 12.0–15.0)
POTASSIUM: 3.3 mmol/L — AB (ref 3.5–5.1)
Sodium: 140 mmol/L (ref 135–145)
TCO2: 24 mmol/L (ref 0–100)

## 2015-02-17 MED ORDER — SODIUM CHLORIDE 0.9 % IV BOLUS (SEPSIS)
1000.0000 mL | Freq: Once | INTRAVENOUS | Status: AC
Start: 2015-02-17 — End: 2015-02-17
  Administered 2015-02-17: 1000 mL via INTRAVENOUS

## 2015-02-17 MED ORDER — ONDANSETRON HCL 4 MG/2ML IJ SOLN
4.0000 mg | Freq: Once | INTRAMUSCULAR | Status: AC
  Administered 2015-02-17: 4 mg via INTRAVENOUS

## 2015-02-17 MED ORDER — ONDANSETRON HCL 4 MG/2ML IJ SOLN
INTRAMUSCULAR | Status: AC
  Filled 2015-02-17: qty 2

## 2015-02-17 MED ORDER — ONDANSETRON 4 MG PREPACK (~~LOC~~): 1.0000 | ORAL_TABLET | Freq: Three times a day (TID) | ORAL | Status: DC | PRN

## 2015-02-17 MED ORDER — ONDANSETRON HCL 4 MG/2ML IJ SOLN: 4.0000 mg | Freq: Once | INTRAMUSCULAR | Status: DC

## 2015-02-17 NOTE — ED Notes (Signed)
i liter  Ns  [place]  [address]  [address]  [place]  l  arm

## 2015-02-17 NOTE — Discharge Instructions (Signed)
It was a pleasure ot see you today.  I believe your nausea/vomiting and diarrhea are caused by a viral gastroenteritis.   We gave you Zofran for the nausea/vomiting, and 1L of IV fluids (normal saline).    I recommend you to continue taking small amounts of clear fluids frequently (every 15-30 minutes), to prevent distention of your stomach.   I am prescribing Zofran 4mg  tablets, which you may take every 8 hours as needed for worsening nausea/recurrent vomiting.  Please return to the Urgent [place] or the Emergency department if you resume with intractable vomiting.  Advance your diet as you tolerate.

## 2015-02-17 NOTE — ED Notes (Signed)
The patient presented to the [facility] with family with a complaint of N/V/D and generalized body aches that started today at 2am.

## 2015-02-17 NOTE — ED Provider Notes (Addendum)
CSN: [id number]     Arrival date & time 02/17/15  1633 History   First MD Initiated Contact with Patient 02/17/15 1754     Chief Complaint  Patient presents with  . Nausea  . Emesis  . Generalized Body Aches   (Consider location/radiation/quality/duration/timing/severity/associated sxs/prior Treatment) Patient is a 59 y.o. female presenting with vomiting. The history is provided by the patient and the spouse. No language interpreter was used.  Emesis Associated symptoms: diarrhea   Associated symptoms: no abdominal pain and no chills   Patient here for complaint of sudden onset nausea/vomiting and diarrhea, which began at 230am today and has been intractable. Several episodes of N/V/D during the day. Unable to keep down applesauce, jell-o or liquids. No observed fevers. Taken no medications today.   PSurgHx; s/p CCY @6  yrs ago.  No other surgeries.   Past Medical History  Diagnosis Date  . Anxiety and depression   . Hypothyroidism   . Allergic rhinitis   . CTS (carpal tunnel syndrome)     question of  . Vitamin D deficiency   . Osteopenia     as reported by pt, DEXA @ gyn  . Colon polyps     BENIGN  . Dysplasia of cervix, low grade (CIN 1)     h/o CIN-1/CIN2  . Prediabetes 09/01/2012   Past Surgical History  Procedure Laterality Date  . Cholecystectomy  2010  . Tonsillectomy    . Uterine polyps  2003    Dr.Fernadez/ RESECTOSCOPIC POLYPECTOMY  . Cryotherapy      FOR CERVICAL DYSPLASIA CIN-1/CIN-2  . Colonoscopy      repeat in 5 years  . Appendectomy     Family History  Problem Relation Age of Onset  . Diabetes Brother     uncles, GM  . Hypertension Brother   . Heart attack Neg Hx   . Colon cancer Neg Hx   . Breast cancer Other     distant cousins  . Osteoporosis Sister     2 sisters    Social History  Substance Use Topics  . Smoking status: Never Smoker   . Smokeless tobacco: Never Used  . Alcohol Use: No     Comment:     OB History    Gravida Para  Term Preterm AB TAB SAB Ectopic Multiple Living   0              Review of Systems  Constitutional: Positive for fatigue. Negative for fever, chills and appetite change.  HENT: Positive for ear pain.        C/o right ear pain today.   Gastrointestinal: Positive for nausea, vomiting and diarrhea. Negative for abdominal pain.  Genitourinary: Negative for dysuria, hematuria, enuresis and difficulty urinating.    Allergies  Ceftriaxone sodium; Cephalosporins; and Sulfonamide derivatives  Home Medications   Prior to Admission medications   Medication Sig Start Date End Date Taking? Authorizing Provider  ALPRAZolam Duanne Moron) 0.5 MG tablet Take 1 tablet (0.5 mg total) by mouth at bedtime as needed for sleep. 12/15/14   Colon Branch, MD  Calcium Carbonate-Vitamin D (CALCIUM + D PO) Take 1 tablet by mouth daily.     Historical Provider, MD  escitalopram (LEXAPRO) 20 MG tablet Take 20 mg by mouth daily.    Historical Provider, MD  fish oil-omega-3 fatty acids 1000 MG capsule Take 2 g by mouth daily.    Historical Provider, MD  fluticasone (FLONASE) 50 MCG/ACT nasal spray Place 2 sprays into  both nostrils at bedtime. 01/03/15   Waldemar Dickens, MD  guaiFENesin-codeine 100-10 MG/5ML syrup Take 5-10 mLs by mouth every 6 (six) hours as needed for cough. 01/03/15   Waldemar Dickens, MD  ipratropium (ATROVENT) 0.06 % nasal spray Place 2 sprays into both nostrils 4 (four) times daily. 01/03/15   Waldemar Dickens, MD  levothyroxine (SYNTHROID, LEVOTHROID) 50 MCG tablet Take 1 tablet (50 mcg total) by mouth daily before breakfast. 01/23/15   Colon Branch, MD  Multiple Vitamin (MULTIVITAMIN) tablet Take 1 tablet by mouth daily.    Historical Provider, MD  polyethylene glycol (MIRALAX / GLYCOLAX) packet Take 17 g by mouth as needed.    Historical Provider, MD   Meds Ordered and Administered this Visit   Medications  sodium chloride 0.9 % bolus 1,000 mL (not administered)  ondansetron (ZOFRAN) injection 4 mg (not  administered)    BP 129/79 mmHg  Pulse 95  Temp(Src) 99.8 F (37.7 C) (Oral)  SpO2 99%  LMP 02/03/2009 No data found.   Physical Exam  Constitutional:  Alert, mildly ill appearing.   HENT:  Right Ear: External ear normal.  Left Ear: External ear normal.  Mouth/Throat: No oropharyngeal exudate.  Dry mucus membranes.  R TM clear, no evidence of serous drainage.   Neck: Normal range of motion. Neck supple. No tracheal deviation present.  Cardiovascular: Normal rate and regular rhythm.   No murmur heard. Pulmonary/Chest: Effort normal and breath sounds normal. No respiratory distress. She has no wheezes. She has no rales. She exhibits no tenderness.  Abdominal: Soft. Bowel sounds are normal. She exhibits no distension and no mass. There is no tenderness. There is no rebound and no guarding.  Lymphadenopathy:    She has no cervical adenopathy.    ED Course  Procedures (including critical care time)  Labs Review Labs Reviewed - No data to display  Imaging Review No results found.   Visual Acuity Review  Right Eye Distance:   Left Eye Distance:   Bilateral Distance:    Right Eye Near:   Left Eye Near:    Bilateral Near:         MDM   1. Gastroenteritis    Patient with apparent viral gastroenteritis which had onset this morning (15 hours ago).  To treat with IVF bolus 1L; IM zofran; to check Chem8.  Mild hypokalemia otherwise unremarkable Chem8.    Willeen Niece, MD 02/17/15 1814  Willeen Niece, MD 02/17/15 [phone]

## 2015-03-06 ENCOUNTER — Ambulatory Visit
Admission: RE | Admit: 2015-03-06 | Discharge: 2015-03-06 | Disposition: A | Discharge status: Home / Self Care | Payer: 59 | Source: Ambulatory Visit

## 2015-03-06 DIAGNOSIS — Z1231 Encounter for screening mammogram for malignant neoplasm of breast: Secondary | ICD-10-CM

## 2015-03-09 ENCOUNTER — Telehealth: Payer: Self-pay | Admitting: Family Medicine

## 2015-03-09 ENCOUNTER — Encounter: Payer: Self-pay | Admitting: Family Medicine

## 2015-03-09 MED ORDER — METRONIDAZOLE 0.75 % EX CREA: TOPICAL_CREAM | Freq: Two times a day (BID) | CUTANEOUS | Status: DC

## 2015-03-09 NOTE — Telephone Encounter (Signed)
Patient having issues with rosacea. She had not been able to get in with her PCP for medication refill. I refilled her med. Jamie Patrick approved me seeing her today.

## 2015-03-19 ENCOUNTER — Encounter: Payer: Self-pay | Admitting: Internal Medicine

## 2015-03-19 MED ORDER — ESCITALOPRAM OXALATE 20 MG PO TABS: 20.0000 mg | ORAL_TABLET | Freq: Every day | ORAL | Status: DC

## 2015-03-23 ENCOUNTER — Encounter: Payer: 59 | Admitting: Women's Health

## 2015-03-23 ENCOUNTER — Telehealth: Payer: Self-pay

## 2015-03-23 NOTE — Telephone Encounter (Signed)
Rec'd from [facility] forward 34 pages to GI Historical Provider

## 2015-03-26 ENCOUNTER — Telehealth: Payer: Self-pay | Admitting: Internal Medicine

## 2015-03-27 ENCOUNTER — Encounter: Payer: Self-pay | Admitting: Women's Health

## 2015-03-27 ENCOUNTER — Other Ambulatory Visit (HOSPITAL_COMMUNITY)
Admission: RE | Admit: 2015-03-27 | Discharge: 2015-03-27 | Disposition: A | Discharge status: Home / Self Care | Payer: 59 | Source: Ambulatory Visit | Attending: Women's Health | Admitting: Women's Health

## 2015-03-27 ENCOUNTER — Ambulatory Visit (INDEPENDENT_AMBULATORY_CARE_PROVIDER_SITE_OTHER): Payer: 59 | Admitting: Women's Health

## 2015-03-27 VITALS — BP 118/80 | Ht <= 58 in | Wt 138.0 lb

## 2015-03-27 DIAGNOSIS — K640 First degree hemorrhoids: Secondary | ICD-10-CM

## 2015-03-27 DIAGNOSIS — Z01419 Encounter for gynecological examination (general) (routine) without abnormal findings: Secondary | ICD-10-CM

## 2015-03-27 DIAGNOSIS — M899 Disorder of bone, unspecified: Secondary | ICD-10-CM

## 2015-03-27 DIAGNOSIS — Z1151 Encounter for screening for human papillomavirus (HPV): Secondary | ICD-10-CM | POA: Insufficient documentation

## 2015-03-27 DIAGNOSIS — Z13820 Encounter for screening for osteoporosis: Secondary | ICD-10-CM | POA: Diagnosis not present

## 2015-03-27 DIAGNOSIS — M8580 Other specified disorders of bone density and structure, unspecified site: Secondary | ICD-10-CM

## 2015-03-27 MED ORDER — HYDROCORTISONE ACE-PRAMOXINE 2.5-1 % RE CREA: 1.0000 "application " | TOPICAL_CREAM | Freq: Three times a day (TID) | RECTAL | Status: DC

## 2015-03-27 NOTE — Patient Instructions (Signed)
Menopause is a normal process in which your reproductive ability comes to an end. This process happens gradually over a span of months to years, usually between the ages of 48 and 58. Menopause is complete when you have missed 12 consecutive menstrual periods. It is important to talk with your health care provider about some of the most common conditions that affect postmenopausal women, such as heart disease, cancer, and bone loss (osteoporosis). Adopting a healthy lifestyle and getting preventive care can help to promote your health and wellness. Those actions can also lower your chances of developing some of these common conditions. WHAT SHOULD I KNOW ABOUT MENOPAUSE? During menopause, you may experience a number of symptoms, such as:  Moderate-to-severe hot flashes.  Night sweats.  Decrease in sex drive.  Mood swings.  Headaches.  Tiredness.  Irritability.  Memory problems.  Insomnia. Choosing to treat or not to treat menopausal changes is an individual decision that you make with your health care provider. WHAT SHOULD I KNOW ABOUT HORMONE REPLACEMENT THERAPY AND SUPPLEMENTS? Hormone therapy products are effective for treating symptoms that are associated with menopause, such as hot flashes and night sweats. Hormone replacement carries certain risks, especially as you become older. If you are thinking about using estrogen or estrogen with progestin treatments, discuss the benefits and risks with your health care provider. WHAT SHOULD I KNOW ABOUT HEART DISEASE AND STROKE? Heart disease, heart attack, and stroke become more likely as you age. This may be due, in part, to the hormonal changes that your body experiences during menopause. These can affect how your body processes dietary fats, triglycerides, and cholesterol. Heart attack and stroke are both medical emergencies. There are many things that you can do to help prevent heart disease and stroke:  Have your blood pressure  checked at least every 1-2 years. High blood pressure causes heart disease and increases the risk of stroke.  If you are 58-79 years old, ask your health care provider if you should take aspirin to prevent a heart attack or a stroke.  Do not use any tobacco products, including cigarettes, chewing tobacco, or electronic cigarettes. If you need help quitting, ask your health care provider.  It is important to eat a healthy diet and maintain a healthy weight.  Be sure to include plenty of vegetables, fruits, low-fat dairy products, and lean protein.  Avoid eating foods that are high in solid fats, added sugars, or salt (sodium).  Get regular exercise. This is one of the most important things that you can do for your health.  Try to exercise for at least 150 minutes each week. The type of exercise that you do should increase your heart rate and make you sweat. This is known as moderate-intensity exercise.  Try to do strengthening exercises at least twice each week. Do these in addition to the moderate-intensity exercise.  Know your numbers.Ask your health care provider to check your cholesterol and your blood glucose. Continue to have your blood tested as directed by your health care provider. WHAT SHOULD I KNOW ABOUT CANCER SCREENING? There are several types of cancer. Take the following steps to reduce your risk and to catch any cancer development as early as possible. Breast Cancer  Practice breast self-awareness.  This means understanding how your breasts normally appear and feel.  It also means doing regular breast self-exams. Let your health care provider know about any changes, no matter how small.  If you are 40 or older, have a clinician do a   breast exam (clinical breast exam or CBE) every year. Depending on your age, family history, and medical history, it may be recommended that you also have a yearly breast X-ray (mammogram).  If you have a family history of breast cancer,  talk with your health care provider about genetic screening.  If you are at high risk for breast cancer, talk with your health care provider about having an MRI and a mammogram every year.  Breast cancer (BRCA) gene test is recommended for women who have family members with BRCA-related cancers. Results of the assessment will determine the need for genetic counseling and BRCA1 and for BRCA2 testing. BRCA-related cancers include these types:  Breast. This occurs in males or females.  Ovarian.  Tubal. This may also be called fallopian tube cancer.  Cancer of the abdominal or pelvic lining (peritoneal cancer).  Prostate.  Pancreatic. Cervical, Uterine, and Ovarian Cancer Your health care provider may recommend that you be screened regularly for cancer of the pelvic organs. These include your ovaries, uterus, and vagina. This screening involves a pelvic exam, which includes checking for microscopic changes to the surface of your cervix (Pap test).  For women ages 58-65, health care providers may recommend a pelvic exam and a Pap test every three years. For women ages 58-65, they may recommend the Pap test and pelvic exam, combined with testing for human papilloma virus (HPV), every five years. Some types of HPV increase your risk of cervical cancer. Testing for HPV may also be done on women of any age who have unclear Pap test results.  Other health care providers may not recommend any screening for nonpregnant women who are considered low risk for pelvic cancer and have no symptoms. Ask your health care provider if a screening pelvic exam is right for you.  If you have had past treatment for cervical cancer or a condition that could lead to cancer, you need Pap tests and screening for cancer for at least 20 years after your treatment. If Pap tests have been discontinued for you, your risk factors (such as having a new sexual partner) need to be reassessed to determine if you should start having  screenings again. Some women have medical problems that increase the chance of getting cervical cancer. In these cases, your health care provider may recommend that you have screening and Pap tests more often.  If you have a family history of uterine cancer or ovarian cancer, talk with your health care provider about genetic screening.  If you have vaginal bleeding after reaching menopause, tell your health care provider.  There are currently no reliable tests available to screen for ovarian cancer. Lung Cancer Lung cancer screening is recommended for adults 3-70 years old who are at high risk for lung cancer because of a history of smoking. A yearly low-dose CT scan of the lungs is recommended if you:  Currently smoke.  Have a history of at least 30 pack-years of smoking and you currently smoke or have quit within the past 15 years. A pack-year is smoking an average of one pack of cigarettes per day for one year. Yearly screening should:  Continue until it has been 15 years since you quit.  Stop if you develop a health problem that would prevent you from having lung cancer treatment. Colorectal Cancer  This type of cancer can be detected and can often be prevented.  Routine colorectal cancer screening usually begins at age 38 and continues through age 12.  If you have  risk factors for colon cancer, your health care provider may recommend that you be screened at an earlier age.  If you have a family history of colorectal cancer, talk with your health care provider about genetic screening.  Your health care provider may also recommend using home test kits to check for hidden blood in your stool.  A small camera at the end of a tube can be used to examine your colon directly (sigmoidoscopy or colonoscopy). This is done to check for the earliest forms of colorectal cancer.  Direct examination of the colon should be repeated every 5-10 years until age 67. However, if early forms of  precancerous polyps or small growths are found or if you have a family history or genetic risk for colorectal cancer, you may need to be screened more often. Skin Cancer  Check your skin from head to toe regularly.  Monitor any moles. Be sure to tell your health care provider:  About any new moles or changes in moles, especially if there is a change in a mole's shape or color.  If you have a mole that is larger than the size of a pencil eraser.  If any of your family members has a history of skin cancer, especially at a D[place]ann [place]ne age, talk with your health care provider about genetic screening.  Always use sunscreen. Apply sunscreen liberally and repeatedly throughout the day.  Whenever you are outside, protect yourself by wearing long sleeves, pants, a wide-brimmed hat, and sunglasses. WHAT SHOULD I KNOW ABOUT OSTEOPOROSIS? Osteoporosis is a condition in which bone destruction happens more quickly than new bone creation. After menopause, you may be at an increased risk for osteoporosis. To help prevent osteoporosis or the bone fractures that can happen because of osteoporosis, the following is recommended:  If you are 39-61 years old, get at least 1,000 mg of calcium and at least 600 mg of vitamin D per day.  If you are older than age 16 but younger than age 7, get at least 1,200 mg of calcium and at least 600 mg of vitamin D per day.  If you are older than age 47, get at least 1,200 mg of calcium and at least 800 mg of vitamin D per day. Smoking and excessive alcohol intake increase the risk of osteoporosis. Eat foods that are rich in calcium and vitamin D, and do weight-bearing exercises several times each week as directed by your health care provider. WHAT SHOULD I KNOW ABOUT HOW MENOPAUSE AFFECTS [place]? Depression may occur at any age, but it is more common as you become older. Common symptoms of depression include:  Low or sad mood.  Changes in sleep patterns.  Changes  in appetite or eating patterns.  Feeling an overall lack of motivation or enjoyment of activities that you previously enjoyed.  Frequent crying spells. Talk with your health care provider if you think that you are experiencing depression. WHAT SHOULD I KNOW ABOUT IMMUNIZATIONS? It is important that you get and maintain your immunizations. These include:  Tetanus, diphtheria, and pertussis (Tdap) booster vaccine.  Influenza every year before the flu season begins.  Pneumonia vaccine.  Shingles vaccine. Your health care provider may also recommend other immunizations.   This information is not intended to replace advice given to you by your health care provider. Make sure you discuss any questions you have with your health care provider.   Document Released: 05/16/2005 Document Revised: 04/14/2014 Document Reviewed: 11/24/2013 Elsevier Interactive Patient Education 2016 Elsevier  Inc. Hemorrhoids Hemorrhoids are swollen veins around the rectum or anus. There are two types of hemorrhoids:   Internal hemorrhoids. These occur in the veins just inside the rectum. They may poke through to the outside and become irritated and painful.  External hemorrhoids. These occur in the veins outside the anus and can be felt as a painful swelling or hard lump near the anus. CAUSES  Pregnancy.   Obesity.   Constipation or diarrhea.   Straining to have a bowel movement.   Sitting for long periods on the toilet.  Heavy lifting or other activity that caused you to strain.  Anal intercourse. SYMPTOMS   Pain.   Anal itching or irritation.   Rectal bleeding.   Fecal leakage.   Anal swelling.   One or more lumps around the anus.  DIAGNOSIS  Your caregiver may be able to diagnose hemorrhoids by visual examination. Other examinations or tests that may be performed include:   Examination of the rectal area with a gloved hand (digital rectal exam).   Examination of anal canal  using a small tube (scope).   A blood test if you have lost a significant amount of blood.  A test to look inside the colon (sigmoidoscopy or colonoscopy). TREATMENT Most hemorrhoids can be treated at home. However, if symptoms do not seem to be getting better or if you have a lot of rectal bleeding, your caregiver may perform a procedure to help make the hemorrhoids get smaller or remove them completely. Possible treatments include:   Placing a rubber band at the base of the hemorrhoid to cut off the circulation (rubber band ligation).   Injecting a chemical to shrink the hemorrhoid (sclerotherapy).   Using a tool to burn the hemorrhoid (infrared light therapy).   Surgically removing the hemorrhoid (hemorrhoidectomy).   Stapling the hemorrhoid to block blood flow to the tissue (hemorrhoid stapling).  HOME CARE INSTRUCTIONS   Eat foods with fiber, such as whole grains, beans, nuts, fruits, and vegetables. Ask your doctor about taking products with added fiber in them (fibersupplements).  Increase fluid intake. Drink enough water and fluids to keep your urine clear or pale yellow.   Exercise regularly.   Go to the bathroom when you have the urge to have a bowel movement. Do not wait.   Avoid straining to have bowel movements.   Keep the anal area dry and clean. Use wet toilet paper or moist towelettes after a bowel movement.   Medicated creams and suppositories may be used or applied as directed.   Only take over-the-counter or prescription medicines as directed by your caregiver.   Take warm sitz baths for 15-20 minutes, 3-4 times a day to ease pain and discomfort.   Place ice packs on the hemorrhoids if they are tender and swollen. Using ice packs between sitz baths may be helpful.   Put ice in a plastic bag.   Place a towel between your skin and the bag.   Leave the ice on for 15-20 minutes, 3-4 times a day.   Do not use a donut-shaped pillow or sit  on the toilet for long periods. This increases blood pooling and pain.  SEEK MEDICAL CARE IF:  You have increasing pain and swelling that is not controlled by treatment or medicine.  You have uncontrolled bleeding.  You have difficulty or you are unable to have a bowel movement.  You have pain or inflammation outside the area of the hemorrhoids. MAKE SURE YOU:  Understand   these instructions.  Will watch your condition.  Will get help right away if you are not doing well or get worse.   This information is not intended to replace advice given to you by your health care provider. Make sure you discuss any questions you have with your health care provider.   Document Released: 03/21/2000 Document Revised: 03/10/2012 Document Reviewed: 01/27/2012 Elsevier Interactive Patient Education [employer].

## 2015-03-27 NOTE — Progress Notes (Signed)
Jamie Patrick [birth year] [id number]    History:    Presents for annual exam.  Postmenopausal on no HRT. 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 Normal mammogram history. Hypothyroid, anxiety and depression managed by primary care. 2014 DEXA -1.6 with FRAX of 14%/0.8%. Not sexually active/husband prostate cancer. 2012 negative colonoscopy.  Past medical history, past surgical history, family history and social history were all reviewed and documented in the EPIC chart. Works in Hotel manager, bilingual, Astronomer. Brother diabetes and hypertension.  ROS:  A ROS was performed and pertinent positives and negatives are included.  Exam:  Filed Vitals:   03/27/15 1123  BP: 118/80    General appearance:  Normal Thyroid:  Symmetrical, normal in size, without palpable masses or nodularity. Respiratory  Auscultation:  Clear without wheezing or rhonchi Cardiovascular  Auscultation:  Regular rate, without rubs, murmurs or gallops  Edema/varicosities:  Not grossly evident Abdominal  Soft,nontender, without masses, guarding or rebound.  Liver/spleen:  No organomegaly noted  Hernia:  None appreciated  Skin  Inspection:  Grossly normal   Breasts: Examined lying and sitting.     Right: Without masses, retractions, discharge or axillary adenopathy.     Left: Without masses, retractions, discharge or axillary adenopathy. Gentitourinary   Inguinal/mons:  Normal without inguinal adenopathy  External genitalia:  Normal  BUS/Urethra/Skene's glands:  Normal  Vagina:  Atrophic  Cervix:  Normal  Uterus:  normal in size, shape and contour.  Midline and mobile  Adnexa/parametria:     Rt: Without masses or tenderness.   Lt: Without masses or tenderness.  Anus and perineum: Normal  Digital rectal exam: Normal sphincter tone without palpated masses or tenderness, no bleeding with exam  Assessment/Plan:  59 y.o. MHF G0 for annual exam with complaint of bright red  rectal bleeding occasionally with bowel movements, constipation, history of hemorrhoids.  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 In the meantime instructed to take stool softener, increase fluids and fiber rich foods in diet. SBE's, continue annual screening mammogram, calcium rich diet, vitamin D 2000 daily. Instructed to have vitamin D level checked with next blood draw.  DEXA instructed to schedule. Home safety, fall prevention and importance of weightbearing exercise reviewed.. UA, Pap with HR HPV typing. Screening guidelines reviewed.    [place], 1:01 PM 03/27/2015

## 2015-03-28 LAB — URINALYSIS W MICROSCOPIC + REFLEX CULTURE
BACTERIA UA: NONE SEEN [HPF]
BILIRUBIN URINE: NEGATIVE
CASTS: NONE SEEN [LPF]
CRYSTALS: NONE SEEN [HPF]
Glucose, UA: NEGATIVE
HGB URINE DIPSTICK: NEGATIVE
KETONES UR: NEGATIVE
Leukocytes, UA: NEGATIVE
Nitrite: NEGATIVE
PROTEIN: NEGATIVE
RBC / HPF: NONE SEEN RBC/HPF (ref <=2)
Specific Gravity, Urine: 1.004 (ref 1.001–1.035)
Squamous Epithelial / LPF: NONE SEEN [HPF] (ref <=5)
WBC UA: NONE SEEN WBC/HPF (ref <=5)
Yeast: NONE SEEN [HPF]
pH: 7 (ref 5.0–8.0)

## 2015-03-29 LAB — CYTOLOGY - PAP

## 2015-03-30 ENCOUNTER — Encounter: Payer: 59 | Admitting: Women's Health

## 2015-04-03 ENCOUNTER — Other Ambulatory Visit: Payer: Self-pay | Admitting: Internal Medicine

## 2015-04-03 NOTE — Telephone Encounter (Signed)
Okay #30 and 2 refills. I can sign upon return sick leave or if you find another provider to sign that is okay

## 2015-04-03 NOTE — Telephone Encounter (Signed)
Pt is requesting refill on Alprazolam.  Last OV: 12/15/2014 Last Fill: 12/15/2014 #30 and 2RF UDS: 08/03/2014 Low risk  Please advise.

## 2015-04-03 NOTE — Telephone Encounter (Signed)
Rx printed, awaiting MD signature.  

## 2015-04-05 NOTE — Telephone Encounter (Addendum)
Rx faxed to [place].

## 2015-04-11 ENCOUNTER — Encounter: Payer: Self-pay | Admitting: Internal Medicine

## 2015-04-16 ENCOUNTER — Encounter: Payer: Self-pay | Admitting: Physician Assistant

## 2015-04-26 ENCOUNTER — Ambulatory Visit (INDEPENDENT_AMBULATORY_CARE_PROVIDER_SITE_OTHER): Payer: 59 | Admitting: Internal Medicine

## 2015-04-26 ENCOUNTER — Encounter: Payer: Self-pay | Admitting: Internal Medicine

## 2015-04-26 VITALS — BP 116/64 | HR 72 | Ht <= 58 in | Wt 139.4 lb

## 2015-04-26 DIAGNOSIS — Z860100 Personal history of colon polyps, unspecified: Secondary | ICD-10-CM

## 2015-04-26 DIAGNOSIS — K625 Hemorrhage of anus and rectum: Secondary | ICD-10-CM | POA: Diagnosis not present

## 2015-04-26 DIAGNOSIS — K602 Anal fissure, unspecified: Secondary | ICD-10-CM

## 2015-04-26 DIAGNOSIS — K6289 Other specified diseases of anus and rectum: Secondary | ICD-10-CM

## 2015-04-26 MED ORDER — NA SULFATE-K SULFATE-MG SULF 17.5-3.13-1.6 GM/177ML PO SOLN: 1.0000 | Freq: Once | ORAL | Status: DC

## 2015-04-26 NOTE — Progress Notes (Signed)
HISTORY OF PRESENT ILLNESS:  Jamie Patrick is a 60 y.o. female , native of [place], who is self-referred at the recommendation of a friend for evaluation of rectal pain, rectal bleeding, and the need for surveillance colonoscopy. Previously a GI patient of Dr. Collene Mares. Multiple outside records reviewed. Patient underwent colonoscopy with Dr. Collene Mares in 2006 with a tubular adenoma removed. Follow-up colonoscopy 2012 negative. Repeat exam in 5 years recommended. Patient also been seen previously for abdominal bloating. Most recent problem began about one year ago. She developed burning with defecation and associated bleeding. Diagnosed with anal fissure. Initially prescribe diltiazem cream and lidocaine ointment. It sounds like she did not tolerate the ointment. Not clear how often or how she uses the cream. Early not helpful. Has been introducing fiber into her diet. Seems to help some. Occasional problems with constipation treated with vigorous hydration and occasional MiraLAX. Recent gynecology evaluation favorable. Prescribed hydrocortisone based cream for her rectal complaints. No other complaints. Accompanied by her husband.  REVIEW OF SYSTEMS:  All non-GI ROS negative except for anxiety, insomnia  Past Medical History  Diagnosis Date  . Anxiety and depression   . Hypothyroidism   . Allergic rhinitis   . CTS (carpal tunnel syndrome)     question of  . Vitamin D deficiency   . Osteopenia     as reported by pt, DEXA @ gyn  . Colon polyps     BENIGN  . Dysplasia of cervix, low grade (CIN 1)     h/o CIN-1/CIN2  . Prediabetes 09/01/2012  . Anal fissure     Past Surgical History  Procedure Laterality Date  . Cholecystectomy  2010  . Tonsillectomy    . Uterine polyps  2003    Dr.Fernadez/ RESECTOSCOPIC POLYPECTOMY  . Cryotherapy      FOR CERVICAL DYSPLASIA CIN-1/CIN-2  . Colonoscopy      repeat in 5 years  . Appendectomy      Social History Jamie Patrick  reports that she has never smoked.  She has never used smokeless tobacco. She reports that she does not drink alcohol or use illicit drugs.  family history includes Breast cancer in her other; Diabetes in her brother; Hypertension in her brother; Osteoporosis in her sister. There is no history of Heart attack or Colon cancer.  Allergies  Allergen Reactions  . Ceftriaxone Sodium     REACTION: nausea  . Cephalosporins Nausea And Vomiting  . Sulfonamide Derivatives     REACTION: near syncope, N\T\V       PHYSICAL EXAMINATION: Vital signs: BP 116/64 mmHg  Pulse 72  Ht 4' 9.5" (1.461 m)  Wt 139 lb 6 oz (63.22 kg)  BMI 29.62 kg/m2  LMP 02/03/2009  Constitutional: generally well-appearing, no acute distress Psychiatric: alert and oriented x3, cooperative Eyes: extraocular movements intact, anicteric, conjunctiva pink Mouth: oral pharynx moist, no lesions Neck: supple without thyromegaly Lymph: no lymphadenopathy Cardiovascular: heart regular rate and rhythm, no murmur Lungs: clear to auscultation bilaterally Abdomen: soft, obese, nontender, nondistended, no obvious ascites, no peritoneal signs, normal bowel sounds, no organomegaly Rectal: Deferred until colonoscopy Extremities: no clubbing cyanosis or lower extremity edema bilaterally Skin: no lesions on visible extremities Neuro: No focal deficits. Deep tendon reflexes intact. No asterixis.   ASSESSMENT:  #1. Rectal pain and bleeding likely secondary to anal fissure. Ongoing  #2. History of adenomatous colon polyp. Due for surveillance   PLAN:  #1. Discussion today on anal fissure. #2. Colonoscopy for colon polyp surveillance and  to rule out other causes rectal bleeding.The nature of the procedure, as well as the risks, benefits, and alternatives were carefully and thoroughly reviewed with the patient. Ample time for discussion and questions allowed. The patient understood, was satisfied, and agreed to proceed. #3. Readdress treatment of fissure post  colonoscopy

## 2015-04-26 NOTE — Patient Instructions (Signed)
You have been scheduled for a colonoscopy. Please follow written instructions given to you at your visit today.  Please pick up your prep supplies at the pharmacy within the next 1-3 days. If you use inhalers (even only as needed), please bring them with you on the day of your procedure.   

## 2015-04-27 ENCOUNTER — Encounter: Payer: Self-pay | Admitting: Internal Medicine

## 2015-05-07 ENCOUNTER — Telehealth: Payer: Self-pay | Admitting: *Deleted

## 2015-05-07 NOTE — Telephone Encounter (Signed)
Will forward to provider's CMA 05/08/15//SLS

## 2015-05-07 NOTE — Telephone Encounter (Signed)
Patient scheduled for 05/11/2015 at 8:45am

## 2015-05-07 NOTE — Telephone Encounter (Signed)
Received fax from [facility] requesting Attending Physician's Report to be completed for "diagnosis of/or treatment with Anxiety", last [3] OV notes are from 2016 and the Anxiety was nort the main reason seen in office; per Dr. Larose Kells, patient will need OV to have these forms completed and also, she needs to sign the medical release form [HIPAA]. LMOM with contact name and number for return call RE: need to schedule OV to have forms completed per provider instructions/SLS 01/30

## 2015-05-09 ENCOUNTER — Encounter: Payer: Self-pay | Admitting: Internal Medicine

## 2015-05-11 ENCOUNTER — Encounter: Payer: Self-pay | Admitting: Internal Medicine

## 2015-05-11 ENCOUNTER — Ambulatory Visit (INDEPENDENT_AMBULATORY_CARE_PROVIDER_SITE_OTHER): Payer: 59 | Admitting: Internal Medicine

## 2015-05-11 VITALS — BP 108/66 | HR 67 | Temp 97.9°F | Ht <= 58 in | Wt 138.0 lb

## 2015-05-11 DIAGNOSIS — E039 Hypothyroidism, unspecified: Secondary | ICD-10-CM | POA: Diagnosis not present

## 2015-05-11 DIAGNOSIS — F32A Depression, unspecified: Secondary | ICD-10-CM

## 2015-05-11 DIAGNOSIS — F418 Other specified anxiety disorders: Secondary | ICD-10-CM | POA: Diagnosis not present

## 2015-05-11 DIAGNOSIS — J3089 Other allergic rhinitis: Secondary | ICD-10-CM

## 2015-05-11 DIAGNOSIS — F419 Anxiety disorder, unspecified: Secondary | ICD-10-CM

## 2015-05-11 DIAGNOSIS — F329 Major depressive disorder, single episode, unspecified: Secondary | ICD-10-CM

## 2015-05-11 LAB — TSH: TSH: 0.8 u[IU]/mL (ref 0.35–4.50)

## 2015-05-11 NOTE — Progress Notes (Signed)
Subjective:    Patient ID: Jamie Patrick, female    DOB: 02/18/1956, 60 y.o.   MRN: [id number]  DOS:  05/11/2015 Type of visit - description : Routine checkup Interval history: needs FMLA which is completed Insomnia: on Xanax, needs a UDS and contract. Anxiety: On Lexapro, continue w/  stress about family issues. Allergies? Has a lot of mucus pooling in the throat , needs to clear it frequently Hypothyroid : good med compliance, due for labs    Review of Systems Denies fever chills No itchy eyes or nose No GERD symptoms No cough or sputum production per se  Past Medical History  Diagnosis Date  . Anxiety and depression   . Hypothyroidism   . Allergic rhinitis   . CTS (carpal tunnel syndrome)     question of  . Vitamin D deficiency   . Osteopenia     as reported by pt, DEXA @ gyn  . Colon polyps     BENIGN  . Dysplasia of cervix, low grade (CIN 1)     h/o CIN-1/CIN2  . Prediabetes 09/01/2012  . Anal fissure     Past Surgical History  Procedure Laterality Date  . Cholecystectomy  2010  . Tonsillectomy    . Uterine polyps  2003    Dr.Fernadez/ RESECTOSCOPIC POLYPECTOMY  . Cryotherapy      FOR CERVICAL DYSPLASIA CIN-1/CIN-2  . Colonoscopy      repeat in 5 years  . Appendectomy      Social History   Social History  . Marital Status: Married    Spouse Name: N/A  . Number of Children: 0  . Years of Education: N/A   Occupational History  . working for a clinic @ [employer]  . Smoking status: Never Smoker   . Smokeless tobacco: Never Used  . Alcohol Use: No     Comment:    . Drug Use: No  . Sexual Activity: No   Other Topics Concern  . Not on file   Social History Narrative   Original from [place]-          Medication List       This list is accurate as of: 05/11/15  5:36 PM.  Always use your most recent med list.               ALPRAZolam 0.5 MG tablet  Commonly known as:  XANAX  Take 1 tablet (0.5 mg total)  by mouth at bedtime as needed for sleep.     CALCIUM + D PO  Take 1 tablet by mouth daily.     escitalopram 20 MG tablet  Commonly known as:  LEXAPRO  Take 1 tablet (20 mg total) by mouth daily.     fish oil-omega-3 fatty acids 1000 MG capsule  Take 2 g by mouth daily.     hydrocortisone 2.5 % rectal cream  Commonly known as:  ANUSOL-HC  Place 1 application rectally 2 (two) times daily.     levothyroxine 50 MCG tablet  Commonly known as:  SYNTHROID, LEVOTHROID  Take 1 tablet (50 mcg total) by mouth daily before breakfast.     MAGNESIUM PO  Take 275 mg by mouth.     metroNIDAZOLE 0.75 % cream  Commonly known as:  METROCREAM  Apply topically 2 (two) times daily.     Na Sulfate-K Sulfate-Mg Sulf Soln  Take 1 kit by mouth once.     polyethylene  glycol packet  Commonly known as:  MIRALAX / GLYCOLAX  Take 17 g by mouth as needed.           Objective:   Physical Exam BP 108/66 mmHg  Pulse 67  Temp(Src) 97.9 F (36.6 C) (Oral)  Ht '4\' 10"'  (1.473 m)  Wt 138 lb (62.596 kg)  BMI 28.85 kg/m2  SpO2 97%  LMP 02/03/2009 General:   Well developed, well nourished . NAD.  HEENT:  Normocephalic . Face symmetric, atraumatic. Nose not congested Lungs:  CTA B Normal respiratory effort, no intercostal retractions, no accessory muscle use. Heart: RRR,  no murmur.  No pretibial edema bilaterally  Skin: Not pale. Not jaundice Neurologic:  alert & oriented X3.  Speech normal, gait appropriate for age and unassisted Psych--  Cognition and judgment appear intact.  Cooperative with normal attention span and concentration.  Behavior appropriate. No anxious or depressed appearing.      Assessment & Plan:   Assessment  Prediabetes Hypothyroidism Anxiety , depression , insomnia  Intolerant to Abilify,Viibrid, used to see Dr Toy Care trazadone (didn't work), elavil (stopped working), Microbiologist (++s/e) Currently on xanax  Allergic rhinitis Osteopenia -- per gyn Vitamin D  deficiency Menopausal  h/o dysplasia of the cervix CN1/CN2 Rosacea-- sees derm     PLAN: Paper work: FMLA completed.Extensive paper work for [employer]  completed Anxiety depression insomnia: Continue with Lexapro and Xanax, no change (out of all the medications she try this combination works the best). UDS and contract today. Hypothyroidism: due for labs , RF PRN Allergic rhinitis: See history of present illness, recommend to continue OTC Allegra or Claritin and consistent use of Flonase.  RTC 12-2015, CPX

## 2015-05-11 NOTE — Progress Notes (Signed)
Pre visit review using our clinic review tool, if applicable. No additional management support is needed unless otherwise documented below in the visit note. 

## 2015-05-11 NOTE — Patient Instructions (Signed)
GO TO THE LAB : Get the blood work    GO TO THE FRONT DESK Schedule a complete physical exam to be done by 12-2015  Please be fasting     Allergies: Continue with Allegra or Claritin OTC Flonase 2 sprays in each side of the nose every day

## 2015-05-11 NOTE — Telephone Encounter (Signed)
Patient in for OV, gave her copy of [employer] FMLA forms faxed on 05/07/14/SLS 02/03 Sent to scan.02/03 Rockwell [place], CMA at 05/09/2015 8:32 AM     Status: Signed       Expand All Collapse All   Received [employer] FMLA paperwork regarding Mr. Birner medical condition, Leukemia, and to have paperwork submitted for his spouse to have time out to take care of him via [employer]; forms picked-up from provider's signed folder and faxed to [employer], copy to scan; will mail original/SLS 01/31       Chaya Jan, CMA at 04/27/2015 11:52 AM     Status: Signed       Expand All Collapse All   Spoke with pt's wife this morning. She states the forms are for her to care for pt and take him to appts (intermittent FMLA) due to his leukemia. I informed pt that I will get the forms to Dr. Larose Kells and our turn around time is 5-7 business days. She would like a call back to her work number at [phone] when forms have been faxed to [employer].   I also informed her if pt or her needs FMLA forms for his upcoming surgery, that they will need to get them from the surgeon. She verbalized understanding.             Margot Ables at 04/26/2015 11:59 AM     Status: Signed       Expand All Collapse All   Call b/w 1:30pm and 2:30pm today if possible regarding [employer] claim and conditions. Wife Aleena ph # [phone].        Chaya Jan, CMA at 04/17/2015 1:19 PM     Status: Signed       Expand All Collapse All   FMLA forms received from [employer] for pt's spouse to care for him. I called to verify what condition these are for. If for surgery, the surgeon will need to fill them out. No answer received and lm to call back. JG//CMA

## 2015-05-17 ENCOUNTER — Encounter: Payer: 59 | Admitting: Internal Medicine

## 2015-05-29 ENCOUNTER — Telehealth: Payer: Self-pay | Admitting: Internal Medicine

## 2015-05-29 ENCOUNTER — Ambulatory Visit: Payer: 59 | Admitting: Family Medicine

## 2015-05-29 ENCOUNTER — Encounter: Payer: Self-pay | Admitting: Internal Medicine

## 2015-05-30 NOTE — Telephone Encounter (Signed)
Patient calling back regarding this. Best # [phone]

## 2015-05-30 NOTE — Telephone Encounter (Signed)
Lm with patient that I left a Suprep sample up front for her to pick up.

## 2015-06-01 ENCOUNTER — Ambulatory Visit (AMBULATORY_SURGERY_CENTER): Payer: 59 | Admitting: Internal Medicine

## 2015-06-01 ENCOUNTER — Encounter: Payer: Self-pay | Admitting: Internal Medicine

## 2015-06-01 VITALS — BP 103/50 | HR 61 | Temp 97.2°F | Resp 15 | Ht <= 58 in | Wt 139.0 lb

## 2015-06-01 DIAGNOSIS — E039 Hypothyroidism, unspecified: Secondary | ICD-10-CM | POA: Diagnosis not present

## 2015-06-01 DIAGNOSIS — E669 Obesity, unspecified: Secondary | ICD-10-CM | POA: Diagnosis not present

## 2015-06-01 DIAGNOSIS — K6289 Other specified diseases of anus and rectum: Secondary | ICD-10-CM | POA: Diagnosis not present

## 2015-06-01 DIAGNOSIS — Z86010 Personal history of colonic polyps: Secondary | ICD-10-CM

## 2015-06-01 DIAGNOSIS — K625 Hemorrhage of anus and rectum: Secondary | ICD-10-CM

## 2015-06-01 DIAGNOSIS — D123 Benign neoplasm of transverse colon: Secondary | ICD-10-CM

## 2015-06-01 DIAGNOSIS — F341 Dysthymic disorder: Secondary | ICD-10-CM | POA: Diagnosis not present

## 2015-06-01 MED ORDER — SODIUM CHLORIDE 0.9 % IV SOLN: 500.0000 mL | INTRAVENOUS | Status: DC

## 2015-06-01 NOTE — Patient Instructions (Signed)
YOU HAD AN ENDOSCOPIC PROCEDURE TODAY AT [facility]:   Refer to the procedure report that was given to you for any specific questions about what was found during the examination.  If the procedure report does not answer your questions, please call your gastroenterologist to clarify.  If you requested that your care partner not be given the details of your procedure findings, then the procedure report has been included in a sealed envelope for you to review at your convenience later.  YOU SHOULD EXPECT: Some feelings of bloating in the abdomen. Passage of more gas than usual.  Walking can help get rid of the air that was put into your GI tract during the procedure and reduce the bloating. If you had a lower endoscopy (such as a colonoscopy or flexible sigmoidoscopy) you may notice spotting of blood in your stool or on the toilet paper. If you underwent a bowel prep for your procedure, you may not have a normal bowel movement for a few days.  Please Note:  You might notice some irritation and congestion in your nose or some drainage.  This is from the oxygen used during your procedure.  There is no need for concern and it should clear up in a day or so.  SYMPTOMS TO REPORT IMMEDIATELY:   Following lower endoscopy (colonoscopy or flexible sigmoidoscopy):  Excessive amounts of blood in the stool  Significant tenderness or worsening of abdominal pains  Swelling of the abdomen that is new, acute  Fever of 100F or higher  For urgent or emergent issues, a gastroenterologist can be reached at any hour by calling [phone].  DIET: Your first meal following the procedure should be a small meal and then it is ok to progress to your normal diet. Heavy or fried foods are harder to digest and may make you feel nauseous or bloated.  Likewise, meals heavy in dairy and vegetables can increase bloating.  Drink plenty of fluids but you should avoid alcoholic beverages for 24 hours.  ACTIVITY:   You should plan to take it easy for the rest of today and you should NOT DRIVE or use heavy machinery until tomorrow (because of the sedation medicines used during the test).    FOLLOW UP: Our staff will call the number listed on your records the next business day following your procedure to check on you and address any questions or concerns that you may have regarding the information given to you following your procedure. If we do not reach you, we will leave a message.  However, if you are feeling well and you are not experiencing any problems, there is no need to return our call.  We will assume that you have returned to your regular daily activities without incident.  If any biopsies were taken you will be contacted by phone or by letter within the next 1-3 weeks.  Please call us at [phone] if you have not heard about the biopsies in 3 weeks.    SIGNATURES/CONFIDENTIALITY: You and/or your care partner have signed paperwork which will be entered into your electronic medical record.  These signatures attest to the fact that that the information above on your After Visit Summary has been reviewed and is understood.  Full responsibility of the confidentiality of this discharge information lies with you and/or your care-partner.  Await pathology- please read over handout about polyps  Continue your normal medications  Next colonoscopy in 5 years

## 2015-06-01 NOTE — Progress Notes (Signed)
Report to PACU, RN, vss, BBS= Clear.  

## 2015-06-01 NOTE — Op Note (Signed)
[facility]  [address] [place], [id number]   COLONOSCOPY PROCEDURE REPORT  PATIENT: Jamie Patrick, Jamie Patrick  MR#: [id number] BIRTHDATE: Aug 27, 1955 , 42  yrs. old GENDER: female ENDOSCOPIST: Eustace Quail, MD REFERRED [id number] Larose Kells, M.D. PROCEDURE DATE:  06/01/2015 PROCEDURE:   Colonoscopy, surveillance and Colonoscopy with snare polypectomy First Screening Colonoscopy - Avg.  risk and is 50 yrs.  old or older - No.  Prior Negative Screening - Now for repeat screening. N/A  History of Adenoma - Now for follow-up colonoscopy & has been > or = to 3 yrs.  Yes hx of adenoma.  Has been 3 or more years since last colonoscopy.  Polyps removed today? Yes ASA CLASS:   Class II INDICATIONS:Surveillance due to prior colonic neoplasia and PH Colon Adenoma.. Prior examinations elsewhere 2006 (tubular adenoma) in 2012 (negative). Has had intermittent problems with symptomatic anal fissure MEDICATIONS: Monitored anesthesia care and Propofol 200 mg IV  DESCRIPTION OF PROCEDURE:   After the risks benefits and alternatives of the procedure were thoroughly explained, informed consent was obtained.  The digital rectal exam revealed no abnormalities of the rectum.   The LB [id number]  endoscope was introduced through the anus and advanced to the cecum, which was identified by both the appendix and ileocecal valve. No adverse events experienced.   The quality of the prep was excellent. (Suprep was used)  The instrument was then slowly withdrawn as the colon was fully examined. Estimated blood loss is zero unless otherwise noted in this procedure report.   COLON FINDINGS: A single polyp measuring 5 mm in size was found in the transverse colon.  A polypectomy was performed with a cold snare.  The resection was complete, the polyp tissue was completely retrieved and sent to histology.   The examination was otherwise normal.  Retroflexed views revealed internal hemorrhoids. The  time to cecum = 2.7 Withdrawal time = 9.7   The scope was withdrawn and the procedure completed. COMPLICATIONS: There were no immediate complications.  ENDOSCOPIC IMPRESSION: 1.   Single polyp was found in the transverse colon; polypectomy was performed with a cold snare 2.   The examination was otherwise normal  RECOMMENDATIONS: 1. Follow up colonoscopy in 5 years  eSigned:  Eustace Quail, MD 06/01/2015 2:34 PM   cc: The Patient and Kathlene November, MD

## 2015-06-02 ENCOUNTER — Ambulatory Visit (INDEPENDENT_AMBULATORY_CARE_PROVIDER_SITE_OTHER): Payer: 59 | Admitting: Emergency Medicine

## 2015-06-02 VITALS — BP 116/68 | HR 73 | Temp 98.7°F | Resp 12 | Ht <= 58 in | Wt 139.0 lb

## 2015-06-02 DIAGNOSIS — J029 Acute pharyngitis, unspecified: Secondary | ICD-10-CM

## 2015-06-02 LAB — POCT RAPID STREP A (OFFICE): RAPID STREP A SCREEN: NEGATIVE

## 2015-06-02 MED ORDER — AZELASTINE HCL 0.1 % NA SOLN: NASAL | Status: DC

## 2015-06-02 MED ORDER — FLUTICASONE PROPIONATE 50 MCG/ACT NA SUSP: 2.0000 | Freq: Every day | NASAL | Status: DC

## 2015-06-02 NOTE — Patient Instructions (Signed)
Allergic Rhinitis Allergic rhinitis is when the mucous membranes in the nose respond to allergens. Allergens are particles in the air that cause your body to have an allergic reaction. This causes you to release allergic antibodies. Through a chain of events, these eventually cause you to release histamine into the blood stream. Although meant to protect the body, it is this release of histamine that causes your discomfort, such as frequent sneezing, congestion, and an itchy, runny nose.  CAUSES Seasonal allergic rhinitis (hay fever) is caused by pollen allergens that may come from grasses, trees, and weeds. Year-round allergic rhinitis (perennial allergic rhinitis) is caused by allergens such as house dust mites, pet dander, and mold spores. SYMPTOMS  Nasal stuffiness (congestion).  Itchy, runny nose with sneezing and tearing of the eyes. DIAGNOSIS Your health care provider can help you determine the allergen or allergens that trigger your symptoms. If you and your health care provider are unable to determine the allergen, skin or blood testing may be used. Your health care provider will diagnose your condition after taking your health history and performing a physical exam. Your health care provider may assess you for other related conditions, such as asthma, pink eye, or an ear infection. TREATMENT Allergic rhinitis does not have a cure, but it can be controlled by:  Medicines that block allergy symptoms. These may include allergy shots, nasal sprays, and oral antihistamines.  Avoiding the allergen. Hay fever may often be treated with antihistamines in pill or nasal spray forms. Antihistamines block the effects of histamine. There are over-the-counter medicines that may help with nasal congestion and swelling around the eyes. Check with your health care provider before taking or giving this medicine. If avoiding the allergen or the medicine prescribed do not work, there are many new medicines  your health care provider can prescribe. Stronger medicine may be used if initial measures are ineffective. Desensitizing injections can be used if medicine and avoidance does not work. Desensitization is when a patient is given ongoing shots until the body becomes less sensitive to the allergen. Make sure you follow up with your health care provider if problems continue. HOME CARE INSTRUCTIONS It is not possible to completely avoid allergens, but you can reduce your symptoms by taking steps to limit your exposure to them. It helps to know exactly what you are allergic to so that you can avoid your specific triggers. SEEK MEDICAL CARE IF:  You have a fever.  You develop a cough that does not stop easily (persistent).  You have shortness of breath.  You start wheezing.  Symptoms interfere with normal daily activities.   This information is not intended to replace advice given to you by your health care provider. Make sure you discuss any questions you have with your health care provider.   Document Released: 12/17/2000 Document Revised: 04/14/2014 Document Reviewed: 11/29/2012 Elsevier Interactive Patient Education 2016 [employer]  

## 2015-06-02 NOTE — Progress Notes (Signed)
Patient ID: Jamie Patrick, female   DOB: [birth year], 60 y.o.   MRN: [id number]    This chart was scribed for Nena Jordan, MD by [facility], medical scribe at [facility].The patient was seen in exam room 14 and the patient's care was started at 11:58 AM.  Chief Complaint:  Chief Complaint  Patient presents with  . Ear Pain    both ear 1 week  . Cough    green /yellow yesterday  . OTHER    had colonoscopy yesterday   HPI: Jamie Patrick is a 60 y.o. female who reports to [facility] today complaining of bilateral ear pain for one week. Rhinorrhea and nasal congestion for the past two days. Also she has had a sore throat since last night. She took allegra yesterday around 5:00 PM, then she tried [employer] plus, finally before bed she tried nyquil and flonase for no relief. Hx of allergies around this time. No muscle aches, or fever. She is currently enrolled in swimming classes and she is concerned her ears are infected.  Past Medical History  Diagnosis Date  . Anxiety and depression   . Hypothyroidism   . Allergic rhinitis   . CTS (carpal tunnel syndrome)     question of  . Vitamin D deficiency   . Osteopenia     as reported by pt, DEXA @ gyn  . Colon polyps     BENIGN  . Dysplasia of cervix, low grade (CIN 1)     h/o CIN-1/CIN2  . Prediabetes 09/01/2012  . Anal fissure   . Depression   . Anxiety   . Allergy    Past Surgical History  Procedure Laterality Date  . Cholecystectomy  2010  . Tonsillectomy    . Uterine polyps  2003    Dr.Fernadez/ RESECTOSCOPIC POLYPECTOMY  . Cryotherapy      FOR CERVICAL DYSPLASIA CIN-1/CIN-2  . Colonoscopy      repeat in 5 years  . Appendectomy     Social History   Social History  . Marital Status: Married    Spouse Name: N/A  . Number of Children: 0  . Years of Education: N/A   Occupational History  . 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 Smoking status: Never Smoker   .  Smokeless tobacco: Never Used  . Alcohol Use: No     Comment:    . Drug Use: No  . Sexual Activity: No   Other Topics Concern  . None   Social History Narrative   Original from [place]-     Family History  Problem Relation Age of Onset  . Diabetes Brother     uncles, GM  . Hypertension Brother   . Heart attack Neg Hx   . Colon cancer Neg Hx   . Breast cancer Other     distant cousins  . Osteoporosis Sister     2 sisters    Allergies  Allergen Reactions  . Ceftriaxone Sodium     REACTION: nausea  . Cephalosporins Nausea And Vomiting  . Sulfonamide Derivatives     REACTION: near syncope, N\T\V   Prior to Admission medications   Medication Sig Start Date End Date Taking? Authorizing Provider  ALPRAZolam Duanne Moron) 0.5 MG tablet Take 1 tablet (0.5 mg total) by mouth at bedtime as needed for sleep. 04/03/15  Yes Colon Branch, MD  Calcium Carbonate-Vitamin D (CALCIUM + D  PO) Take 1 tablet by mouth daily.    Yes Historical Provider, MD  escitalopram (LEXAPRO) 20 MG tablet Take 1 tablet (20 mg total) by mouth daily. 03/19/15  Yes Colon Branch, MD  fish oil-omega-3 fatty acids 1000 MG capsule Take 2 g by mouth daily.   Yes Historical Provider, MD  hydrocortisone (ANUSOL-HC) 2.5 % rectal cream Place 1 application rectally 2 (two) times daily. Reported on 06/01/2015   Yes Historical Provider, MD  levothyroxine (SYNTHROID, LEVOTHROID) 50 MCG tablet Take 1 tablet (50 mcg total) by mouth daily before breakfast. 01/23/15  Yes Colon Branch, MD  MAGNESIUM PO Take 275 mg by mouth.   Yes Historical Provider, MD  metroNIDAZOLE (METROCREAM) 0.75 % cream Apply topically 2 (two) times daily. 03/09/15  Yes Kinnie Feil, MD  polyethylene glycol (MIRALAX / GLYCOLAX) packet Take 17 g by mouth as needed. Reported on 06/02/2015    Historical Provider, MD   ROS: The patient denies fevers, chills, night sweats, unintentional weight loss, chest pain, palpitations, wheezing, dyspnea on exertion, nausea, vomiting,  abdominal pain, dysuria, hematuria, melena, numbness, weakness, or tingling.  All other systems have been reviewed and were otherwise negative with the exception of those mentioned in the HPI and as above.    PHYSICAL EXAM: Filed Vitals:   06/02/15 1123  BP: 116/68  Pulse: 73  Temp: 98.7 F (37.1 C)  Resp: 12   Body mass index is 30.07 kg/(m^2).  General: Alert, no acute distress HEENT:  Normocephalic, atraumatic, oropharynx patent. Eye: Juliette Mangle [place] Cardiovascular:  Regular rate and rhythm, no rubs murmurs or gallops.  No Carotid bruits, radial pulse intact. No pedal edema.  Respiratory: Clear to auscultation bilaterally.  No wheezes, rales, or rhonchi.  No cyanosis, no use of accessory musculature Abdominal: No organomegaly, abdomen is soft and non-tender, positive bowel sounds.  No masses. Musculoskeletal: Gait intact. No edema, tenderness Skin: No rashes. Neurologic: Facial musculature symmetric. Psychiatric: Patient acts appropriately throughout our interaction. Lymphatic: No cervical or submandibular lymphadenopathy Genitourinary/Anorectal: No acute findings  LABS: Results for orders placed or performed in visit on 06/02/15  POCT rapid strep A  Result Value Ref Range   Rapid Strep A Screen Negative Negative    ASSESSMENT/PLAN: We'll treat with Claritin and Flonase. If she wants to she can try Allegra instead of Claritin. I also prescribed some Astelin nasal spray. Gross sideeffects, risk and benefits, and alternatives of medications d/w patient. Patient is aware that all medications have potential sideeffects and we are unable to predict every sideeffect or drug-drug interaction that may occur.  By signing my name below, I, Nadim Abuhashem, attest that this documentation has been prepared under the direction and in the presence of Nena Jordan, MD.  Electronically Signed: Lora Havens, medical scribe. 06/02/2015, 12:16 PM.  Arlyss Queen MD 06/02/2015 11:58  AM

## 2015-06-04 ENCOUNTER — Telehealth: Payer: Self-pay

## 2015-06-04 NOTE — Telephone Encounter (Signed)
  Follow up Call-  Call back number 06/01/2015  Post procedure Call Back phone  # [phone]  Permission to leave phone message Yes    Patient was called for follow up after her procedure on 06/01/2015. No answer at the number given for follow up phone call. A message was left on the answering machine.

## 2015-06-07 ENCOUNTER — Encounter: Payer: Self-pay | Admitting: Internal Medicine

## 2015-06-15 ENCOUNTER — Ambulatory Visit: Payer: 59 | Admitting: Internal Medicine

## 2015-06-19 ENCOUNTER — Encounter: Payer: Self-pay | Admitting: Family Medicine

## 2015-06-19 ENCOUNTER — Ambulatory Visit (INDEPENDENT_AMBULATORY_CARE_PROVIDER_SITE_OTHER): Payer: 59 | Admitting: Family Medicine

## 2015-06-19 VITALS — Ht <= 58 in | Wt 136.2 lb

## 2015-06-19 DIAGNOSIS — E663 Overweight: Secondary | ICD-10-CM

## 2015-06-19 NOTE — Progress Notes (Signed)
Medical Nutrition Therapy:  Appt start time: 1130 end time:  1230. PCP: Colon Branch, MD  Assessment:  Primary concerns today: Weight management.  Jamie Patrick has also been diagnosed with pre-DM (2014).    Jamie Patrick's weight is down about 10 lb since last last August.  She took the 6-wk Weigh to Wellness class, which ended last week.  She has made some meaningful changes such as cutting down on the sugar and cream in her coffee (has it black every day except for 1 day a week), getting veg's consistently, usually getting.  Jamie Patrick has started going to the [facility], 60 min exercise class twice a week, and swimming 1 X wk.  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    Difficulty sleeping is adding to Jamie Patrick's stress levels, and she battles the inclination to eat at night when she cannot sleep.    Progress Towards Goal(s):  In progress.   Nutritional Diagnosis: Progress noted on [place]-3.3 Overweight/obesity As related to energy balance.  As evidenced by weight loss of >5 lb since June.    Intervention:  Nutrition education.  Handouts given during visit include:  AVS  Goals Sheet  Sleep Handout  Demonstrated degree of understanding via:  Teach Back   Monitoring/Evaluation:  Dietary intake, exercise, and body weight in 6 week(s).

## 2015-06-19 NOTE — Patient Instructions (Addendum)
Goals: 1. Continue to get a LOT of veg's at both lunch and dinner. 2. Physical activity:  At least 60 min 3 X wk.   3. When having trouble sleeping at night, ONLY non-food activities, such as reading, Sudoku, prayer, writing.  (Make a list of activities.)     Bring your Goals Sheet to follow-up.    Find at least one thing you can try from the Sleep Handout provided today.

## 2015-07-06 NOTE — Telephone Encounter (Signed)
Pt was seen on 06-01-15

## 2015-07-13 ENCOUNTER — Encounter: Payer: Self-pay | Admitting: Family Medicine

## 2015-07-13 ENCOUNTER — Other Ambulatory Visit: Payer: Self-pay | Admitting: Family Medicine

## 2015-07-13 MED ORDER — CIPROFLOXACIN-DEXAMETHASONE 0.3-0.1 % OT SUSP: 4.0000 [drp] | Freq: Two times a day (BID) | OTIC | Status: AC

## 2015-07-13 MED ORDER — CIPROFLOXACIN-DEXAMETHASONE 0.3-0.1 % OT SUSP: 4.0000 [drp] | Freq: Two times a day (BID) | OTIC | Status: DC

## 2015-07-13 NOTE — Telephone Encounter (Signed)
Patient seen at the clinic today for left ear ache which started today. She denies fever,no cough. Denies ear discharge. She issues with seasonal allergies for which she uses Astelin, she is uncertain if her symptom is related to allergy. On exam, her left ear is red and inflamed with no discharge, TM is inflamed as well. I will treated her for otitis externa. Ciprodex prescribed. F/U with PCP if no improvement.

## 2015-07-13 NOTE — Progress Notes (Unsigned)
Patient ID: Jamie Patrick, female   DOB: 1956-01-27, 60 y.o.   MRN: [id number] Left ear

## 2015-07-23 ENCOUNTER — Other Ambulatory Visit: Payer: Self-pay | Admitting: Internal Medicine

## 2015-07-23 NOTE — Telephone Encounter (Signed)
Rx faxed to [place].

## 2015-07-23 NOTE — Telephone Encounter (Signed)
Pt is requesting refill on Alprazolam.  Last OV: 05/11/2015 Last Fill: 04/03/2015 #30 and 2RF UDS: Pending  Please advise.

## 2015-07-23 NOTE — Telephone Encounter (Signed)
Okay 30 and 2 refills 

## 2015-07-23 NOTE — Telephone Encounter (Signed)
Rx printed, awaiting MD signature.  

## 2015-07-26 ENCOUNTER — Ambulatory Visit (INDEPENDENT_AMBULATORY_CARE_PROVIDER_SITE_OTHER): Payer: 59

## 2015-07-26 DIAGNOSIS — M899 Disorder of bone, unspecified: Secondary | ICD-10-CM | POA: Diagnosis not present

## 2015-07-26 DIAGNOSIS — Z13820 Encounter for screening for osteoporosis: Secondary | ICD-10-CM

## 2015-07-26 DIAGNOSIS — M8580 Other specified disorders of bone density and structure, unspecified site: Secondary | ICD-10-CM

## 2015-08-03 ENCOUNTER — Encounter: Payer: Self-pay | Admitting: Internal Medicine

## 2015-08-06 ENCOUNTER — Ambulatory Visit (INDEPENDENT_AMBULATORY_CARE_PROVIDER_SITE_OTHER): Payer: 59 | Admitting: Family Medicine

## 2015-08-06 ENCOUNTER — Encounter: Payer: Self-pay | Admitting: Family Medicine

## 2015-08-06 VITALS — Ht <= 58 in | Wt 139.0 lb

## 2015-08-06 DIAGNOSIS — E663 Overweight: Secondary | ICD-10-CM

## 2015-08-06 NOTE — Patient Instructions (Addendum)
-   Aim for getting most calcium from your food rather than from a supplement, BUT do get a calcium supplement of about 500-600 mg per day.    - Take your calcium supplement at times when you are not also eating a high-calcium food (like calcium-fortified) soy milk, so you will absorb the most calcium.    - Also avoid both coffee and a lot of fiber with your calcium supplement.   - Vitamin D:  Make sure you are getting at least 1000 IU per day.  When you see your PCP next, ask for a test of vitamin D, so we really know how much vit D you need daily.    - You will get the best absorption of vitamin if taken with dietary fat.  - Keep in mind that eating more fruits and veg's will also be beneficial to your bone health.    Goals: 1. Eat at least 3 REAL meals and 1-2 snacks per day.  Aim for no more than 5 hours between eating.  Eat breakfast within one hour of getting up.   - A REAL meal includes protein, starch, and veg's and/or fruit.    - Protein foods include:  Meat, fish, poultry, beans, eggs, dairy foods, soy foods.    - Starchy foods include:  Corn, potatoes, rice, pasta, bread, cereal, oatmeal, any grain, crackers, bagels, muffins, all bakery products.    - Veg's include all veg's that are NOT starchy (like corn and portatoes).    - If you prefer a smaller meals at dinner time, that's fine, but still get at least these 3 components.   2. Continue to get physical activity at least 45 min 3 X wk AND your daily walk to the credit union.   3. When having trouble sleeping at night, do ONLY non-food activities, such as reading, Sudoku, reading, prayer, writing. MAKE A LIST OF ACTIVITIES YOU CAN DO AT THESE TIMES.  Check out some podcasts on your smart phone or computer.

## 2015-08-06 NOTE — Progress Notes (Signed)
Medical Nutrition Therapy:  Appt start time: 1130 end time:  1230. PCP: Colon Branch, MD  Assessment:  Primary concerns today: Weight management.  Jamie Patrick has also been diagnosed with pre-DM (2014).    [name] said she has backslid on eating behaviors recently.  She feels stressed for a number of reasons, and this often translates into poorer food choices and night snacking.  The [employer] program for cancer survivors that allowed [facility] to participate with her husband is over, but she is still going to the [employer] 45 min 3 X wk.  She has signed up for the next session of swim lessons, which start in late May.  She is still walking to the credit union in the hospital, a 15-20-min walk daily M-F.    [name] got results of her bone density scan, which indicated she is osteopenic.  Has not had a vitamin D measured.    24-hr recall:  (Up at 8 AM) B (8:15 AM)-  1 c Sp K w/ almonds,10 oz almond milk Snk (11:30)-  1 c coffee, 1 T flvrd creamer L (1 PM)-  1 banana, 1 slc hi-fiber bread, water Snk (2:20)-  1 slc mushrm pizza, water Snk (3 PM)-  2 str chs, 10 Wheat Thins D (6 PM)-  1/2 c chx livers, gravy, 1/4 c 7-layer salad, few sips swt tea, water Snk (7 PM)-  1 slc apple pie, 1/3 c ice crm, water Snk (9 PM)-  Miralax in 4 oz juice diluted with water Typical day? Yes.   Usually has three real meals in addn to snacks.  Seldom having desserts, but has a small chocolate in afternoon ~5 X wk.    Progress Towards Goal(s):  In progress.   Nutritional Diagnosis: Some regression on [place]-3.3 Overweight/obesity As related to energy balance.  As evidenced by weight gain of ~3 lb in 6 weeks.    Intervention:  Nutrition education.  Handouts given during visit include:  AVS  Demonstrated degree of understanding via:  Teach Back   Monitoring/Evaluation:  Dietary intake, exercise, and body weight in 6 week(s).

## 2015-09-16 ENCOUNTER — Ambulatory Visit (INDEPENDENT_AMBULATORY_CARE_PROVIDER_SITE_OTHER): Payer: 59

## 2015-09-16 ENCOUNTER — Ambulatory Visit (HOSPITAL_COMMUNITY)
Admission: EM | Admit: 2015-09-16 | Discharge: 2015-09-16 | Disposition: A | Discharge status: Home / Self Care | Payer: 59 | Attending: Family Medicine | Admitting: Family Medicine

## 2015-09-16 ENCOUNTER — Encounter (HOSPITAL_COMMUNITY): Payer: Self-pay | Admitting: Emergency Medicine

## 2015-09-16 DIAGNOSIS — S92352A Displaced fracture of fifth metatarsal bone, left foot, initial encounter for closed fracture: Secondary | ICD-10-CM | POA: Diagnosis not present

## 2015-09-16 DIAGNOSIS — S92302A Fracture of unspecified metatarsal bone(s), left foot, initial encounter for closed fracture: Secondary | ICD-10-CM

## 2015-09-16 MED ORDER — IBUPROFEN 800 MG PO TABS: 800.0000 mg | ORAL_TABLET | Freq: Three times a day (TID) | ORAL | Status: DC

## 2015-09-16 MED ORDER — HYDROCODONE-ACETAMINOPHEN 5-325 MG PO TABS: 2.0000 | ORAL_TABLET | ORAL | Status: DC | PRN

## 2015-09-16 NOTE — Discharge Instructions (Signed)
Fractura metatarsiana (Metatarsal Fracture) Ardelia Mems fractura metatarsiana es una quebradura que se produce en un hueso metatarsiano. Estos huesos [employer] de los dedos de los pies con los del [place]. CAUSAS Este tipo de fractura puede deberse a lo siguiente:  La torcedura repentina del pie.  La cada [employer]V.  El uso excesivo de esa zona o el ejercicio repetitivo. FACTORES DE RIESGO Es ms probable que esta afeccin se manifieste en las personas que:  Therapist, occupational deportes de Diplomatic Services operational officer.  Tienen una enfermedad [employer].  Tienen bajos niveles de calcio. SNTOMAS Los sntomas de esta afeccin incluyen lo siguiente:  Dolor que empeora al caminar o al ponerse de pie.  Dolor al ejercer presin [employer]V o al [employer] dedos.  Hinchazn.  Hematomas en la parte superior o inferior del pie.  Un pie que parece ms corto que el otro. DIAGNSTICO Esta afeccin se diagnostica mediante un examen fsico. Adems, pueden hacerle estudios de diagnstico por imgenes, por ejemplo:  Radiografas.  Tomografa computarizada.  Resonancia magntica. TRATAMIENTO El tratamiento de esta afeccin depender de la gravedad y de si el hueso se ha desplazado. El tratamiento puede incluir lo siguiente:  Reposo.  El uso de un aparato ortopdico en el pie, como un yeso, una frula o una bota durante varias semanas.  El uso de [place].  Ciruga para reacomodar los [employer] posicin normal. Por lo general, se necesitar una ciruga si hay muchos fragmentos de hueso roto o si los [employer] se han desplazado mucho (fractura con desplazamiento).  Fisioterapia. Esta puede ayudar a recuperar toda la movilidad y la fuerza en el pie. Hasta que la fractura se consolide, deber ver de nuevo al mdico para que le tome radiografas. El Animator las radiografas para asegurarse de que el pie se est recuperando como corresponde. INSTRUCCIONES PARA EL CUIDADO EN EL HOGAR  Si tiene un  yeso:  No introduzca nada adentro del yeso para rascarse la piel. Esto puede aumentar el riesgo de tener infecciones.  [place] piel de alrededor del yeso. Informe al mdico cualquier inquietud que tenga. Puede aplicar una locin en la piel seca alrededor de los bordes del yeso. No aplique locin en la piel por debajo del yeso.  Mantenga el yeso seco y limpio. Si tiene una frula o una bota ortopdica:  selas como se lo haya indicado el mdico. Quteselas solamente como se lo haya indicado el mdico.  Afloje la frula si los dedos se le entumecen, si siente hormigueos o si se le enfran y se tornan de Optician, dispensing.  Mantngalo limpio y seco. El bao  No tome baos de inmersin, no nade ni use el jacuzzi hasta que el mdico lo autorice. Pregntele al mdico si puede ducharse. Thurston Pounds solo le permitan tomar baos de [place].  Si el mdico lo autoriza a que se bae y se duche, [place] el yeso o la frula con una bolsa de plstico hermtica para protegerlos del agua. No permita que el yeso o la frula se mojen. Control del dolor, la rigidez y la hinchazn  Si se lo indican, colquese hielo en la zona de la lesin (si tiene una frula, no un yeso).  Ponga el hielo en una bolsa plstica.  Coloque una toalla entre la piel y la bolsa de hielo.  Coloque el hielo durante 84minutos, 2 a 3veces por Training and development officer.  Mueva los dedos de los pies con frecuencia para evitar que se entumezcan y  para reducir la hinchazn.  Cuando est sentado o acostado, eleve la zona de la lesin por encima del nivel del corazn. Conducir  No conduzca ni opere maquinaria pesada mientras toma analgsicos.  No conduzca mientras use un soporte en el pie que utiliza para conducir. Actividad  Reanude sus actividades normales como se lo haya indicado el mdico. Pregntele al mdico qu actividades son seguras para usted.  Haga ejercicio como se lo haya indicado el fisioterapeuta o el mdico. Seguridad  No  apoye el peso del cuerpo sobre el pie lesionado hasta que lo autorice el mdico. Use las [employer] se lo haya indicado el mdico. Instrucciones generales  No ejerza presin en ninguna parte del yeso o de la frula hasta que se hayan endurecido. Esto puede tomar [employer].  No consuma ningn producto que contenga tabaco, lo que incluye cigarrillos, tabaco de Higher education careers adviser o Psychologist, sport and exercise. El tabaco puede retardar la consolidacin de la fractura. Si necesita ayuda para dejar de fumar, consulte al mdico.  Tome los medicamentos solamente como se lo haya indicado el mdico.  Concurra a todas las visitas de control como se lo haya indicado el mdico. Esto es importante. SOLICITE ATENCIN MDICA SI:  Jaclynn Guarneri.  El yeso, la frula o la bota estn muy ajustados o muy flojos.  El yeso, la frula o la bota se daan.  El medicamento no Production designer, theatre/television/film.  Siente dolor, hormigueo o entumecimiento en el pie, y estos sntomas no desaparecen. SOLICITE ATENCIN MDICA DE INMEDIATO SI:  Siente dolor intenso.  Tiene hormigueo o entumecimiento que [employer].  Tiene el pie fro o adormecido.  El pie cambia de color.   Esta informacin no tiene Marine scientist el consejo del mdico. Asegrese de hacerle al mdico cualquier pregunta que tenga.   Document Released: 01/01/2005 Document Revised: 08/08/2014 Elsevier Interactive Patient Education [employer].

## 2015-09-16 NOTE — ED Provider Notes (Signed)
CSN: [id number]     Arrival date & time 09/16/15  1502 History   None    No chief complaint on file.  (Consider location/radiation/quality/duration/timing/severity/associated sxs/prior Treatment) Patient is a 60 y.o. female presenting with foot injury.  Foot Injury Location:  Foot Time since incident:  1 hour Foot location:  L foot Pain details:    Quality:  Aching   Radiates to:  Does not radiate   Severity:  Moderate   Onset quality:  Sudden   Duration:  1 hour   Timing:  Constant   Progression:  Unchanged Chronicity:  New Dislocation: no   Foreign body present:  No foreign bodies Prior injury to area:  No Relieved by:  Rest Worsened by:  Bearing weight Ineffective treatments:  None tried Associated symptoms: stiffness     Past Medical History  Diagnosis Date  . Anxiety and depression   . Hypothyroidism   . Allergic rhinitis   . CTS (carpal tunnel syndrome)     question of  . Vitamin D deficiency   . Osteopenia     as reported by pt, DEXA @ gyn  . Colon polyps     BENIGN  . Dysplasia of cervix, low grade (CIN 1)     h/o CIN-1/CIN2  . Prediabetes 09/01/2012  . Anal fissure   . Depression   . Anxiety   . Allergy    Past Surgical History  Procedure Laterality Date  . Cholecystectomy  2010  . Tonsillectomy    . Uterine polyps  2003    Dr.Fernadez/ RESECTOSCOPIC POLYPECTOMY  . Cryotherapy      FOR CERVICAL DYSPLASIA CIN-1/CIN-2  . Colonoscopy      repeat in 5 years  . Appendectomy     Family History  Problem Relation Age of Onset  . Diabetes Brother     uncles, GM  . Hypertension Brother   . Heart attack Neg Hx   . Colon cancer Neg Hx   . Breast cancer Other     distant cousins  . Osteoporosis Sister     2 sisters    Social History  Substance Use Topics  . Smoking status: Never Smoker   . Smokeless tobacco: Never Used  . Alcohol Use: No     Comment:     OB History    Gravida Para Term Preterm AB TAB SAB Ectopic Multiple Living   0               Review of Systems  Constitutional: Negative.   HENT: Negative.   Eyes: Negative.   Cardiovascular: Negative.   Gastrointestinal: Negative.   Endocrine: Negative.   Genitourinary: Negative.   Musculoskeletal: Positive for arthralgias and stiffness.  Skin: Negative.   Allergic/Immunologic: Negative.   Neurological: Negative.   Hematological: Negative.   Psychiatric/Behavioral: Negative.     Allergies  Ceftriaxone sodium; Cephalosporins; and Sulfonamide derivatives  Home Medications   Prior to Admission medications   Medication Sig Start Date End Date Taking? Authorizing Provider  ALPRAZolam Duanne Moron) 0.5 MG tablet Take 1 tablet (0.5 mg total) by mouth at bedtime as needed for sleep. 07/23/15   Colon Branch, MD  azelastine (ASTELIN) 0.1 % nasal spray 1-2 sprays in each nostril twice a day. 06/02/15   Darlyne Russian, MD  Calcium Carbonate-Vitamin D (CALCIUM + D PO) Take 1 tablet by mouth daily.     Historical Provider, MD  escitalopram (LEXAPRO) 20 MG tablet Take 1 tablet (20 mg total) by mouth  daily. 03/19/15   Colon Branch, MD  fish oil-omega-3 fatty acids 1000 MG capsule Take 2 g by mouth daily.    Historical Provider, MD  fluticasone (FLONASE) 50 MCG/ACT nasal spray Place 2 sprays into both nostrils daily. 06/02/15   Darlyne Russian, MD  hydrocortisone (ANUSOL-HC) 2.5 % rectal cream Place 1 application rectally 2 (two) times daily. Reported on 06/01/2015    Historical Provider, MD  levothyroxine (SYNTHROID, LEVOTHROID) 50 MCG tablet Take 1 tablet (50 mcg total) by mouth daily before breakfast. 01/23/15   Colon Branch, MD  MAGNESIUM PO Take 275 mg by mouth.    Historical Provider, MD  metroNIDAZOLE (METROCREAM) 0.75 % cream Apply topically 2 (two) times daily. 03/09/15   Kinnie Feil, MD  polyethylene glycol (MIRALAX / GLYCOLAX) packet Take 17 g by mouth as needed. Reported on 06/02/2015    Historical Provider, MD   Meds Ordered and Administered this Visit  Medications - No data to  display  LMP 02/03/2009 No data found.   Physical Exam  Constitutional: She appears well-developed and well-nourished.  HENT:  Head: Normocephalic and atraumatic.  Eyes: Pupils are equal, round, and reactive to light.  Cardiovascular: Normal rate, regular rhythm and normal heart sounds.   Pulmonary/Chest: Effort normal and breath sounds normal.  Musculoskeletal: She exhibits tenderness. She exhibits no edema.  TTP left 5th metatarsal and lateral foot pain.  No swelling or deformity.    ED Course  Procedures (including critical care time)  Labs Review Labs Reviewed - No data to display  Imaging Review No results found.   Visual Acuity Review  Right Eye Distance:   Left Eye Distance:   Bilateral Distance:    Right Eye Near:   Left Eye Near:    Bilateral Near:         MDM   Left 5th metatarsal fracture - Initially rx'd hydrocodone but she states she is allergic to vicodin.  She states she feels high when she takes this. Norco rx is tore up and DC'd.  Motrin 800mg  po tid prn pain #21.  Refer to Orthopedics Dr. Sharol Given.   Gave patient Dr. Sharol Given address and phone number. Ortho shoe and crutches.  Explained not to bear weight on left foot.     Lysbeth Penner, FNP 09/16/15 1601

## 2015-09-16 NOTE — ED Notes (Signed)
The patient presented to the [facility] with a complaint of an injury to her left foot secondary to twisting it while walking.

## 2015-09-17 ENCOUNTER — Ambulatory Visit: Payer: 59 | Admitting: Family Medicine

## 2015-09-17 DIAGNOSIS — S92354A Nondisplaced fracture of fifth metatarsal bone, right foot, initial encounter for closed fracture: Secondary | ICD-10-CM | POA: Diagnosis not present

## 2015-10-01 DIAGNOSIS — S92354D Nondisplaced fracture of fifth metatarsal bone, right foot, subsequent encounter for fracture with routine healing: Secondary | ICD-10-CM | POA: Diagnosis not present

## 2015-10-03 ENCOUNTER — Encounter: Payer: Self-pay | Admitting: Internal Medicine

## 2015-10-15 ENCOUNTER — Encounter: Payer: Self-pay | Admitting: Women's Health

## 2015-10-15 DIAGNOSIS — S92354D Nondisplaced fracture of fifth metatarsal bone, right foot, subsequent encounter for fracture with routine healing: Secondary | ICD-10-CM | POA: Diagnosis not present

## 2015-10-24 ENCOUNTER — Encounter: Payer: Self-pay | Admitting: Internal Medicine

## 2015-10-25 ENCOUNTER — Other Ambulatory Visit: Payer: Self-pay | Admitting: Internal Medicine

## 2015-10-29 ENCOUNTER — Ambulatory Visit (INDEPENDENT_AMBULATORY_CARE_PROVIDER_SITE_OTHER): Payer: 59 | Admitting: Family Medicine

## 2015-10-29 ENCOUNTER — Encounter: Payer: Self-pay | Admitting: Family Medicine

## 2015-10-29 DIAGNOSIS — E663 Overweight: Secondary | ICD-10-CM | POA: Diagnosis not present

## 2015-10-29 NOTE — Progress Notes (Signed)
Medical Nutrition Therapy:  Appt start time: 1130 end time:  1230. PCP: Colon Branch, MD  Assessment:  Primary concerns today: Weight management.  Jamie Patrick has also been diagnosed with pre-DM (2014).    [name] broke her toe on June 11, so has been unable to exercise since then.  She hopes to get her boot off July 31, and is concerned about taking more time off for PT appts.  I encouraged her to follow up with PT to optimize rehab, however, which will likely result in quicker return to normal activity.  This is a goal of Jamie Patrick's to help with weight loss.    She brought in a copy of the body comp analysis she had done through [employer] program, indicating she is currently ~39% body fat.    24-hr recall:  (Up at 7 AM) B (8:30 AM)-  2 scrmbld eggs, coffee, 1 t sugar, 1 T crm Snk (12 PM)-  3-4 oz Coke, 3 cc cookies L (2 PM)-  2 skinless chx legs, 1 c swt pot casserole, 1/4 c broc casserole, 1/2 devilled egg, 1/2 c rice, 1/2 c pinappl casserole, 6 oz Sprite Snk ( PM)-  6 oz juice, diluted with water Snk (7:30)-  1 1/2 c ice crm, water Snk ( PM)-  --- Typical day? No. Went to a family reunion yesterday.  Usually has Strawberry Special K with vanilla soy milk.  Still bringing lunch from home, i.e., dinner leftovers.    Progress Towards Goal(s):  In progress.   Nutritional Diagnosis: Some regression on [place]-3.3 Overweight/obesity As related to energy balance.  As evidenced by weight gain of ~1 lb in past couple months.    Intervention:  Nutrition education.  Handouts given during visit include:  AVS  Demonstrated degree of understanding via:  Teach Back   Monitoring/Evaluation:  Dietary intake, exercise, and body weight in 10 week(s).

## 2015-10-29 NOTE — Patient Instructions (Addendum)
-   Ask your orthopedist what physical activity you can do as soon as you get your boot off.    - Definitely follow up with physical therapy once you get your boot off.    - Ask for what exercises you can do at home, if you can't go to appts there.    - Ask the PT what exercises you can do to increase your activity in addn to exercises recommended for rehab.    - To keep your metabolism up, you'll want to make sure you:   1. Get regular exercise weekly   2. Move intermittently throughout the day  3. Eat within the first hour of getting up  4. Eat at regular intervals throughout the day (at least every 5 hours)  5. Eat plenty of low-calorie-dense foods, such as lean protein foods and high-fiber carb foods (vegetables!).    From your last nutrition appt:   - Aim for getting most calcium from your food rather than from a supplement, BUT do get a calcium supplement of about 500-600 mg per day.   - Take your calcium supplement at times when you are not also eating a high-calcium food (like calcium-fortified) soy milk, so you will absorb the most calcium.   - Avoid both coffee and a lot of fiber with your calcium supplement.   - Vitamin D:  Make sure you are getting at least 1000 IU per day.  When you see your PCP next, ask for a test of vitamin D, so we really know how much vit D you need daily.   - You will get the best absorption of vitamin D if taken with dietary fat.  - Keep in mind that eating more fruits and veg's is also beneficial to your bone health.

## 2015-11-05 DIAGNOSIS — S92354D Nondisplaced fracture of fifth metatarsal bone, right foot, subsequent encounter for fracture with routine healing: Secondary | ICD-10-CM | POA: Diagnosis not present

## 2015-11-08 ENCOUNTER — Telehealth: Payer: Self-pay | Admitting: Internal Medicine

## 2015-11-08 NOTE — Telephone Encounter (Signed)
Rx printed, awaiting MD signature.  

## 2015-11-08 NOTE — Telephone Encounter (Signed)
Jamie Patrick-Please call Pt, she is due for routine follow-up at her convenience. Thank you.

## 2015-11-08 NOTE — Telephone Encounter (Signed)
Pt is requesting refill on Alprazolam.  Last OV: 05/11/2015 Last Fill: 07/23/2015 #30 and 2RF UDS: 05/15/2015 Insufficient quantity, will need recollect  Please advise.

## 2015-11-08 NOTE — Telephone Encounter (Signed)
Okay #30 and one refill. Recollect a  UDS at the earliest patient convenience

## 2015-11-08 NOTE — Telephone Encounter (Signed)
LVM for pt to return call to schedule an routine follow-up appt.

## 2015-11-08 NOTE — Telephone Encounter (Signed)
Rx faxed to [place].

## 2015-11-26 DIAGNOSIS — S92354D Nondisplaced fracture of fifth metatarsal bone, right foot, subsequent encounter for fracture with routine healing: Secondary | ICD-10-CM | POA: Diagnosis not present

## 2015-11-28 ENCOUNTER — Encounter: Payer: Self-pay | Admitting: Internal Medicine

## 2015-12-11 ENCOUNTER — Other Ambulatory Visit: Payer: Self-pay | Admitting: Internal Medicine

## 2015-12-19 ENCOUNTER — Ambulatory Visit (INDEPENDENT_AMBULATORY_CARE_PROVIDER_SITE_OTHER): Payer: 59 | Admitting: Internal Medicine

## 2015-12-19 ENCOUNTER — Encounter: Payer: Self-pay | Admitting: Internal Medicine

## 2015-12-19 VITALS — BP 108/62 | HR 69 | Temp 98.0°F | Resp 12 | Ht <= 58 in | Wt 138.1 lb

## 2015-12-19 DIAGNOSIS — F418 Other specified anxiety disorders: Secondary | ICD-10-CM

## 2015-12-19 DIAGNOSIS — F329 Major depressive disorder, single episode, unspecified: Secondary | ICD-10-CM

## 2015-12-19 DIAGNOSIS — Z09 Encounter for follow-up examination after completed treatment for conditions other than malignant neoplasm: Secondary | ICD-10-CM | POA: Diagnosis not present

## 2015-12-19 DIAGNOSIS — R1310 Dysphagia, unspecified: Secondary | ICD-10-CM

## 2015-12-19 DIAGNOSIS — R1032 Left lower quadrant pain: Secondary | ICD-10-CM

## 2015-12-19 DIAGNOSIS — F32A Depression, unspecified: Secondary | ICD-10-CM

## 2015-12-19 DIAGNOSIS — R739 Hyperglycemia, unspecified: Secondary | ICD-10-CM | POA: Diagnosis not present

## 2015-12-19 DIAGNOSIS — M545 Low back pain: Secondary | ICD-10-CM | POA: Diagnosis not present

## 2015-12-19 DIAGNOSIS — Z0000 Encounter for general adult medical examination without abnormal findings: Secondary | ICD-10-CM

## 2015-12-19 DIAGNOSIS — F419 Anxiety disorder, unspecified: Secondary | ICD-10-CM

## 2015-12-19 LAB — POCT URINALYSIS DIPSTICK
BILIRUBIN UA: NEGATIVE
Blood, UA: NEGATIVE
Glucose, UA: NEGATIVE
KETONES UA: NEGATIVE
LEUKOCYTES UA: NEGATIVE
Nitrite, UA: NEGATIVE
PH UA: 7.5
SPEC GRAV UA: 1.02
Urobilinogen, UA: 0.2

## 2015-12-19 LAB — LIPID PANEL
CHOL/HDL RATIO: 5
CHOLESTEROL: 208 mg/dL — AB (ref 0–200)
HDL: 44 mg/dL (ref >39.00)
LDL CALC: 138 mg/dL — AB (ref 0–99)
NonHDL: 164.37
TRIGLYCERIDES: 130 mg/dL (ref 0.0–149.0)
VLDL: 26 mg/dL (ref 0.0–40.0)

## 2015-12-19 LAB — CBC WITH DIFFERENTIAL/PLATELET
BASOS ABS: 0 10*3/uL (ref 0.0–0.1)
Basophils Relative: 0.5 % (ref 0.0–3.0)
EOS ABS: 0.1 10*3/uL (ref 0.0–0.7)
Eosinophils Relative: 2.2 % (ref 0.0–5.0)
HEMATOCRIT: 38.8 % (ref 36.0–46.0)
HEMOGLOBIN: 13.1 g/dL (ref 12.0–15.0)
LYMPHS PCT: 46.9 % — AB (ref 12.0–46.0)
Lymphs Abs: 2.3 10*3/uL (ref 0.7–4.0)
MCHC: 33.7 g/dL (ref 30.0–36.0)
MCV: 90.5 fl (ref 78.0–100.0)
MONOS PCT: 5.8 % (ref 3.0–12.0)
Monocytes Absolute: 0.3 10*3/uL (ref 0.1–1.0)
Neutro Abs: 2.2 10*3/uL (ref 1.4–7.7)
Neutrophils Relative %: 44.6 % (ref 43.0–77.0)
Platelets: 206 10*3/uL (ref 150.0–400.0)
RBC: 4.29 Mil/uL (ref 3.87–5.11)
RDW: 15.6 % — ABNORMAL HIGH (ref 11.5–15.5)
WBC: 5 10*3/uL (ref 4.0–10.5)

## 2015-12-19 LAB — COMPREHENSIVE METABOLIC PANEL
ALBUMIN: 4.3 g/dL (ref 3.5–5.2)
ALT: 20 U/L (ref 0–35)
AST: 22 U/L (ref 0–37)
Alkaline Phosphatase: 72 U/L (ref 39–117)
BILIRUBIN TOTAL: 1 mg/dL (ref 0.2–1.2)
BUN: 11 mg/dL (ref 6–23)
CALCIUM: 9.4 mg/dL (ref 8.4–10.5)
CHLORIDE: 105 meq/L (ref 96–112)
CO2: 28 meq/L (ref 19–32)
CREATININE: 0.72 mg/dL (ref 0.40–1.20)
GFR: 88.08 mL/min (ref >60.00)
Glucose, Bld: 100 mg/dL — ABNORMAL HIGH (ref 70–99)
Potassium: 4.5 mEq/L (ref 3.5–5.1)
SODIUM: 139 meq/L (ref 135–145)
Total Protein: 7.6 g/dL (ref 6.0–8.3)

## 2015-12-19 LAB — URINALYSIS, ROUTINE W REFLEX MICROSCOPIC
BILIRUBIN URINE: NEGATIVE
KETONES UR: NEGATIVE
LEUKOCYTES UA: NEGATIVE
NITRITE: NEGATIVE
Specific Gravity, Urine: 1.015 (ref 1.000–1.030)
TOTAL PROTEIN, URINE-UPE24: NEGATIVE
URINE GLUCOSE: NEGATIVE
UROBILINOGEN UA: 0.2 (ref 0.0–1.0)
pH: 7 (ref 5.0–8.0)

## 2015-12-19 LAB — HEMOGLOBIN A1C: Hgb A1c MFr Bld: 5.5 % (ref 4.6–6.5)

## 2015-12-19 LAB — TSH: TSH: 2.29 u[IU]/mL (ref 0.35–4.50)

## 2015-12-19 MED ORDER — CYCLOBENZAPRINE HCL 10 MG PO TABS: 10.0000 mg | ORAL_TABLET | Freq: Every evening | ORAL | 0 refills | Status: DC | PRN

## 2015-12-19 MED ORDER — ALPRAZOLAM 0.5 MG PO TABS: 0.5000 mg | ORAL_TABLET | Freq: Every evening | ORAL | 5 refills | Status: DC | PRN

## 2015-12-19 NOTE — Assessment & Plan Note (Addendum)
Prediabetes: Check A1c Hypothyroidism: Continue Synthroid, check a TSH Anxiety depression insomnia: Continue Xanax, Lexapro,   sign a contract, UDS on RTC Back pain: Worse with certain movements, feeling stiff in the back. Likely MSK issue. Recommend to discontinue ibuprofen, prescribed Flexeril, Tylenol, heating pad. Reassess in few weeks. Left-sided abdominal pain: Sx started 2 days ago, mild tenderness at the left side, symptoms not classic for urolithiasis. Udip trace protein. Check a UA-UCX, reassess in 2 weeks. Dysphagia: Sporadic symptoms, only to fluids. Unclear etiology. No weight loss. Recommend to DC Motrin and take OTC Nexium. Reassess in 2 weeks RTC 2 weeks as already scheduled

## 2015-12-19 NOTE — Progress Notes (Signed)
Pre visit review using our clinic review tool, if applicable. No additional management support is needed unless otherwise documented below in the visit note. 

## 2015-12-19 NOTE — Progress Notes (Signed)
Subjective:    Patient ID: Jamie Patrick, female    DOB: 1956-03-03, 60 y.o.   MRN: [id number]  DOS:  12/19/2015 Type of visit - description : cpx Interval history: In addition to CPX, we discussed other issues: Hypothyroidism: Good medication compliance Anxiety: Well-controlled, needs a UDS and contract Back pain: Several weeks history of back pain, very noticeable in the morning, mostly at the mid low back and left side. She feels very stiff in the morning, ibuprofen helps to some extent. No radiation, no lower extremity paresthesias. 2 days ago developed a pain at the left side of the abdomen as well, mild, persistent, no coliky; denies dysuria, gross hematuria, fever, chills or a rash.     Review of Systems Constitutional: No fever. No chills. No unexplained wt changes. No unusual sweats  HEENT: No dental problems, no ear discharge, no facial swelling, no voice changes. No eye discharge, no eye  redness , no  intolerance to light   Respiratory: No wheezing , no  difficulty breathing. No cough , no mucus production  Cardiovascular: No CP, no leg swelling , no  Palpitations  GI: no nausea, no vomiting, no diarrhea , no  abdominal pain.  No blood in the stools.  Reports difficulty swallowing, states that sporadically saliva getting stuck in the throat as well as water, symptoms not happening during eating sometimes "while I watch TV". Never happen with solids, denies weight loss or odynophagia. Endocrine: No polyphagia, no polyuria , no polydipsia  GU: No dysuria, gross hematuria, difficulty urinating. No urinary urgency, no frequency.  Musculoskeletal: No joint swellings or unusual aches or pains  Skin: No change in the color of the skin, palor , no  Rash  Allergic, immunologic: No environmental allergies , no  food allergies  Neurological: No dizziness no  syncope. No headaches. No diplopia, no slurred, no slurred speech, no motor deficits, no facial   Numbness  Hematological: No enlarged lymph nodes, no easy bruising , no unusual bleedings  Psychiatry: No suicidal ideas, no hallucinations, no beavior problems, no confusion.  No unusual/severe anxiety, no depression   Past Medical History:  Diagnosis Date  . Allergic rhinitis   . Allergy   . Anal fissure   . Anxiety   . Anxiety and depression   . Colon polyps    BENIGN  . CTS (carpal tunnel syndrome)    question of  . Depression   . Dysplasia of cervix, low grade (CIN 1)    h/o CIN-1/CIN2  . Hypothyroidism   . Osteopenia    as reported by pt, DEXA @ gyn  . Prediabetes 09/01/2012  . Vitamin D deficiency     Past Surgical History:  Procedure Laterality Date  . APPENDECTOMY    . CHOLECYSTECTOMY  2010  . CRYOTHERAPY     FOR CERVICAL DYSPLASIA CIN-1/CIN-2  . TONSILLECTOMY    . uterine polyps  2003   Dr.Fernadez/ RESECTOSCOPIC POLYPECTOMY    Social History   Social History  . Marital status: Married    Spouse name: N/A  . Number of children: 0  . Years of education: N/A   Occupational History  . working for a clinic @ [employer] Topics  . Smoking status: Never Smoker  . Smokeless tobacco: Never Used  . Alcohol use No     Comment:    . Drug use: No  . Sexual activity: No   Other Topics Concern  . Not  on file   Social History Narrative   Original from [place]-       Family History  Problem Relation Age of Onset  . Diabetes Brother     uncles, GM  . Hypertension Brother   . Breast cancer Other     distant cousins  . Osteoporosis Sister     2 sisters   . Heart attack Neg Hx   . Colon cancer Neg Hx        Medication List       Accurate as of 12/19/15  4:39 PM. Always use your most recent med list.          ALPRAZolam 0.5 MG tablet Commonly known as:  XANAX Take 1 tablet (0.5 mg total) by mouth at bedtime as needed for sleep.   CALCIUM + D PO Take 1 tablet by mouth daily.   cyclobenzaprine 10 MG  tablet Commonly known as:  FLEXERIL Take 1 tablet (10 mg total) by mouth at bedtime as needed for muscle spasms.   escitalopram 20 MG tablet Commonly known as:  LEXAPRO Take 1 tablet (20 mg total) by mouth daily.   fish oil-omega-3 fatty acids 1000 MG capsule Take 2 g by mouth daily.   levothyroxine 50 MCG tablet Commonly known as:  SYNTHROID, LEVOTHROID Take 1 tablet (50 mcg total) by mouth daily before breakfast.   MAGNESIUM PO Take 275 mg by mouth.   metroNIDAZOLE 0.75 % cream Commonly known as:  METROCREAM Apply topically 2 (two) times daily.   polyethylene glycol packet Commonly known as:  MIRALAX / GLYCOLAX Take 17 g by mouth as needed. Reported on 06/02/2015          Objective:   Physical Exam BP 108/62 (BP Location: Left Arm, Patient Position: Sitting, Cuff Size: Small)   Pulse 69   Temp 98 F (36.7 C) (Oral)   Resp 12   Ht 4\' 10"  (1.473 m)   Wt 138 lb 2 oz (62.7 kg)   LMP 02/03/2009   SpO2 98%   BMI 28.87 kg/m    General:   Well developed, well nourished . NAD.  Neck: No  thyromegaly  HEENT:  Normocephalic . Face symmetric, atraumatic Lungs:  CTA B Normal respiratory effort, no intercostal retractions, no accessory muscle use. Heart: RRR,  no murmur.  No pretibial edema bilaterally  Abdomen:  Not distended, soft. Mild tenderness at the left side of the abdomen without mass or rebound. Normal bowel sounds. MSK: No TTP at the axial back, slightly TTP and the sacroiliac area.  Did report pain when she tried to get up from the examining table  Skin: Exposed areas without rash. Not pale. Not jaundice Neurologic:  alert & oriented X3.  Speech normal, gait appropriate for age and unassisted. Not antalgic. Strength symmetric and appropriate for age. DTRs symmetric. Psych: Cognition and judgment appear intact.  Cooperative with normal attention span and concentration.  Behavior appropriate. No anxious or depressed appearing.    Assessment & Plan:     Assessment  Prediabetes Hypothyroidism Anxiety , depression , insomnia  Intolerant to Abilify,Viibrid, used to see Dr Toy Care trazadone (didn't work), elavil (stopped working), Microbiologist (++s/e) Currently on xanax -lexapro Allergic rhinitis Osteopenia -- per gyn, last DEXA 07-2015 Vitamin D deficiency: On oral supplements Menopausal  h/o dysplasia of the cervix CN1/CN2 Rosacea-- sees derm     PLAN: Prediabetes: Check A1c Hypothyroidism: Continue Synthroid, check a TSH Anxiety depression insomnia: Continue Xanax, Lexapro,   sign a contract, UDS  on RTC Back pain: Worse with certain movements, feeling stiff in the back. Likely MSK issue. Recommend to discontinue ibuprofen, prescribed Flexeril, Tylenol, heating pad. Reassess in few weeks. Left-sided abdominal pain: Sx started 2 days ago, mild tenderness at the left side, symptoms not classic for urolithiasis. Udip trace protein. Check a UA-UCX, reassess in 2 weeks. Dysphagia: Sporadic symptoms, only to fluids. Unclear etiology. No weight loss. Recommend to DC Motrin and take OTC Nexium. Reassess in 2 weeks RTC 2 weeks as already scheduled   In addition on her CPX, I spent more than 22  min with the patient: >50% of the time counseling regards chronic problems and 3 acute issues: Back pain, left-sided abdominal pain and dysphagia. Multiple questions answered to the best of my ability

## 2015-12-19 NOTE — Assessment & Plan Note (Addendum)
Td 2007  ; zostavax-- pt's husband immunosuppressed, unable to take; Will get flu shot at work Female [facility] per gyn Elon Alas) [id number] (-) Cscope Dr Collene Mares, aprox 2006 and again 04-2010, next in 5 years per report   diet-- sees a nutritionist exercise currently limited by acute back pain

## 2015-12-19 NOTE — Patient Instructions (Signed)
GO TO THE LAB : Get the blood work      For pain in the back: Heating pad twice a day Stop ibuprofen, Tylenol 500 mg OTC 2 tablets every 8 hours as needed  Start Nexium 20 mg OTC 1 tablet before breakfast

## 2015-12-20 LAB — URINE CULTURE: ORGANISM ID, BACTERIA: NO GROWTH

## 2015-12-23 ENCOUNTER — Encounter: Payer: Self-pay | Admitting: Internal Medicine

## 2016-01-03 ENCOUNTER — Encounter: Payer: Self-pay | Admitting: Internal Medicine

## 2016-01-03 ENCOUNTER — Ambulatory Visit: Payer: 59 | Admitting: Internal Medicine

## 2016-01-03 ENCOUNTER — Ambulatory Visit (HOSPITAL_BASED_OUTPATIENT_CLINIC_OR_DEPARTMENT_OTHER)
Admission: RE | Admit: 2016-01-03 | Discharge: 2016-01-03 | Disposition: A | Discharge status: Home / Self Care | Payer: 59 | Source: Ambulatory Visit | Attending: Internal Medicine | Admitting: Internal Medicine

## 2016-01-03 ENCOUNTER — Ambulatory Visit (INDEPENDENT_AMBULATORY_CARE_PROVIDER_SITE_OTHER): Payer: 59 | Admitting: Internal Medicine

## 2016-01-03 VITALS — BP 118/68 | HR 80 | Temp 98.2°F | Resp 14 | Ht <= 58 in | Wt 139.2 lb

## 2016-01-03 DIAGNOSIS — R1310 Dysphagia, unspecified: Secondary | ICD-10-CM | POA: Diagnosis not present

## 2016-01-03 DIAGNOSIS — M545 Low back pain: Secondary | ICD-10-CM | POA: Diagnosis not present

## 2016-01-03 DIAGNOSIS — M549 Dorsalgia, unspecified: Secondary | ICD-10-CM | POA: Diagnosis not present

## 2016-01-03 DIAGNOSIS — M47816 Spondylosis without myelopathy or radiculopathy, lumbar region: Secondary | ICD-10-CM | POA: Diagnosis not present

## 2016-01-03 DIAGNOSIS — M47896 Other spondylosis, lumbar region: Secondary | ICD-10-CM | POA: Insufficient documentation

## 2016-01-03 MED ORDER — CYCLOBENZAPRINE HCL 10 MG PO TABS: 10.0000 mg | ORAL_TABLET | Freq: Every evening | ORAL | 4 refills | Status: DC | PRN

## 2016-01-03 NOTE — Progress Notes (Signed)
Pre visit review using our clinic review tool, if applicable. No additional management support is needed unless otherwise documented below in the visit note. 

## 2016-01-03 NOTE — Patient Instructions (Signed)
  GO TO THE FRONT DESK Schedule your next appointment for a  checkup in 4 months  STOP BY THE FIRST FLOOR:  get the XR

## 2016-01-03 NOTE — Progress Notes (Signed)
Subjective:    Patient ID: Jamie Patrick, female    DOB: [birth year], 60 y.o.   MRN: [id number]  DOS:  01/03/2016 Type of visit - description : Follow-up Interval history: Left sided low back pain without radiation: Doing about the same, taking Flexeril at night, does not seem to have any major impact on pain but is definitely helping with insomnia in a way  better than Xanax. Dysphagia: At this time, she reports that occasionally  dry foods get stuck. Did not mention any problems with saliva or liquids.   Review of Systems  No fever, chills or weight loss No chest pain or difficulty breathing No heartburn Some dry mouth.  Past Medical History:  Diagnosis Date  . Allergic rhinitis   . Allergy   . Anal fissure   . Anxiety   . Anxiety and depression   . Colon polyps    BENIGN  . CTS (carpal tunnel syndrome)    question of  . Depression   . Dysplasia of cervix, low grade (CIN 1)    h/o CIN-1/CIN2  . Hypothyroidism   . Osteopenia    as reported by pt, DEXA @ gyn  . Prediabetes 09/01/2012  . Vitamin D deficiency     Past Surgical History:  Procedure Laterality Date  . APPENDECTOMY    . CHOLECYSTECTOMY  2010  . CRYOTHERAPY     FOR CERVICAL DYSPLASIA CIN-1/CIN-2  . TONSILLECTOMY    . uterine polyps  2003   Dr.Fernadez/ RESECTOSCOPIC POLYPECTOMY    Social History   Social History  . Marital status: Married    Spouse name: N/A  . Number of children: 0  . Years of education: N/A   Occupational History  . 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 Smoking status: Never Smoker  . Smokeless tobacco: Never Used  . Alcohol use No     Comment:    . Drug use: No  . Sexual activity: No   Other Topics Concern  . Not on file   Social History Narrative   Original from [place]-          Medication List       Accurate as of 01/03/16  9:03 AM. Always use your most recent med list.          ALPRAZolam 0.5 MG tablet Commonly known as:   XANAX Take 1 tablet (0.5 mg total) by mouth at bedtime as needed for sleep.   CALCIUM + D PO Take 1 tablet by mouth daily.   cyclobenzaprine 10 MG tablet Commonly known as:  FLEXERIL Take 1 tablet (10 mg total) by mouth at bedtime as needed for muscle spasms.   escitalopram 20 MG tablet Commonly known as:  LEXAPRO Take 1 tablet (20 mg total) by mouth daily.   fish oil-omega-3 fatty acids 1000 MG capsule Take 2 g by mouth daily.   levothyroxine 50 MCG tablet Commonly known as:  SYNTHROID, LEVOTHROID Take 1 tablet (50 mcg total) by mouth daily before breakfast.   MAGNESIUM PO Take 275 mg by mouth.   metroNIDAZOLE 0.75 % cream Commonly known as:  METROCREAM Apply topically 2 (two) times daily.   polyethylene glycol packet Commonly known as:  MIRALAX / GLYCOLAX Take 17 g by mouth as needed. Reported on 06/02/2015          Objective:   Physical Exam BP 118/68 (BP Location: Left Arm, Patient Position: Sitting, Cuff Size:  Small)   Pulse 80   Temp 98.2 F (36.8 C) (Oral)   Resp 14   Ht 4\' 10"  (1.473 m)   Wt 139 lb 4 oz (63.2 kg)   LMP 02/03/2009   SpO2 97%   BMI 29.10 kg/m  General:   Well developed, well nourished . NAD.  HEENT:  Normocephalic . Face symmetric, atraumatic Neck: No LADs or mass Lungs:  CTA B Normal respiratory effort, no intercostal retractions, no accessory muscle use. Heart: RRR,  no murmur.  No pretibial edema bilaterally  MSK-neuro: Moderately TTP at the left lower back, DTRs symmetric, gait and posture not antalgic. Straight leg test negative Skin: Not pale. Not jaundice Neurologic:  alert & oriented X3.   Psych--  Cognition and judgment appear intact.  Cooperative with normal attention span and concentration.  Behavior appropriate. No anxious or depressed appearing.      Assessment & Plan:   Assessment  Prediabetes Hypothyroidism Anxiety , depression , insomnia (01-03-16: pt noted flexeril helps better than xanax) Intolerant to  Abilify,Viibrid, used to see Dr Toy Care trazadone (didn't work), elavil (stopped working), Microbiologist (++s/e) Currently on xanax -lexapro Allergic rhinitis Osteopenia -- per gyn, last DEXA 07-2015 Vitamin D deficiency: On oral supplements Menopausal  h/o dysplasia of the cervix CN1/CN2 Rosacea-- sees derm     PLAN: Back pain: No better, again suspect MSK issue, she is a slightly tender at the left lower back. Will recommend the patient to continue Flexeril, get a x-ray and do physical therapy. Dysphagia: Today report different symptoms, dysphagia is only with dry solids. Thus, sx not consistent; has no  red flags, recommend observation for now noting she has a dry mouth, Sjogren's affecting her swallowing?  Also decided not to take Nexium because she does not has heartburn at all. Anxiety depression insomnia: Prescribed Flexeril for back pain, has noted that Flexeril helps better with insomnia than  Xanax. Recommend to stay on Flexeril daily at bedtime for now, do not mix with Xanax. RTC 4 months

## 2016-01-03 NOTE — Assessment & Plan Note (Signed)
Back pain: No better, again suspect MSK issue, she is a slightly tender at the left lower back. Will recommend the patient to continue Flexeril, get a x-ray and do physical therapy. Dysphagia: Today report different symptoms, dysphagia is only with dry solids. Thus, sx not consistent; has no  red flags, recommend observation for now noting she has a dry mouth, Sjogren's affecting her swallowing?  Also decided not to take Nexium because she does not has heartburn at all. Anxiety depression insomnia: Prescribed Flexeril for back pain, has noted that Flexeril helps better with insomnia than  Xanax. Recommend to stay on Flexeril daily at bedtime for now, do not mix with Xanax. RTC 4 months

## 2016-01-07 ENCOUNTER — Ambulatory Visit (INDEPENDENT_AMBULATORY_CARE_PROVIDER_SITE_OTHER): Payer: 59 | Admitting: Family Medicine

## 2016-01-07 ENCOUNTER — Encounter: Payer: Self-pay | Admitting: Family Medicine

## 2016-01-07 DIAGNOSIS — E663 Overweight: Secondary | ICD-10-CM

## 2016-01-07 NOTE — Patient Instructions (Addendum)
-   Review the pdf file I email you (from class) about emotional eating.   - Email Edmonia Lynch what your experience is in trying to follow some of the recommendations (e.g., the three Ds) from that class.   - Make a written list related to the stresses you are currently dealing with, as a starting point for determining solutions.

## 2016-01-07 NOTE — Progress Notes (Signed)
Medical Nutrition Therapy:  Appt start time: 1130 end time:  1230. PCP: Colon Branch, MD  Assessment:  Primary concerns today: Weight management.  Patrice has also been diagnosed with pre-DM (2014).    Catriona did not want to weigh today; eating has not gone well recently.  She has been struggling with emotional eating, feeling much stress in her life at this time.  We talked about what she can control in her life, and how to use some of the tools taught in the Weigh to Wellness class she has participated in.    Progress Towards Goal(s):  In progress.   Nutritional Diagnosis: Some regression on [place]-3.3 Overweight/obesity As related to energy balance.  As evidenced by self-report of night-time snacking related to emotion vs hunger.    Intervention:  Nutrition education.  Handouts given during visit include:  AVS  Demonstrated degree of understanding via:  Teach Back   Monitoring/Evaluation:  Dietary intake, exercise, and body weight in 4 weeks.

## 2016-01-18 ENCOUNTER — Other Ambulatory Visit: Payer: Self-pay | Admitting: Internal Medicine

## 2016-01-18 DIAGNOSIS — Z1231 Encounter for screening mammogram for malignant neoplasm of breast: Secondary | ICD-10-CM

## 2016-01-20 ENCOUNTER — Encounter: Payer: Self-pay | Admitting: Women's Health

## 2016-01-20 ENCOUNTER — Encounter: Payer: Self-pay | Admitting: Internal Medicine

## 2016-01-21 ENCOUNTER — Other Ambulatory Visit: Payer: Self-pay | Admitting: Internal Medicine

## 2016-02-04 DIAGNOSIS — H5203 Hypermetropia, bilateral: Secondary | ICD-10-CM | POA: Diagnosis not present

## 2016-02-11 ENCOUNTER — Encounter: Payer: Self-pay | Admitting: Family Medicine

## 2016-02-11 ENCOUNTER — Ambulatory Visit (INDEPENDENT_AMBULATORY_CARE_PROVIDER_SITE_OTHER): Payer: 59 | Admitting: Family Medicine

## 2016-02-11 DIAGNOSIS — R635 Abnormal weight gain: Secondary | ICD-10-CM | POA: Diagnosis not present

## 2016-02-11 DIAGNOSIS — R7303 Prediabetes: Secondary | ICD-10-CM | POA: Diagnosis not present

## 2016-02-11 NOTE — Progress Notes (Signed)
Medical Nutrition Therapy:  Appt start time: 1130 end time:  1230. PCP: Colon Branch, MD  Assessment:  Primary concerns today: Weight management and Blood sugar control (Wt gain R63.5 & pre-DM R73.03).    Jamie Patrick has started to have problems with carpal tunnel pain for which she will see Dr. Nori Riis Friday.  This will have a slight impact on home food preparation, but her husband was very helpful during her recovery from her injury, so will also be able to help at this time.    She recently watched the DVD about body image, "Embrace."  This seems to have helped her to be realistic about her weight loss, i.e., while she wants to work on her weight loss, she wants to do this in a reasonable way.  Jamie Patrick expressed interest in participating in more of the Weigh to Wellness wt mgmt classes in 2018.    Jamie Patrick and her husband have both agreed to not buy more cookies and candies to keep in the home, and she talked of being highly motivated to avoid DM, which her brother has, and fatty liver, which her sister has.    Progress Towards Goal(s):  In progress.   Nutritional Diagnosis: Some regression on [place]-3.3 Overweight/obesity As related to energy balance.  As evidenced by self-report of night-time snacking related to emotion vs hunger.    Intervention:  Nutrition education.  Handouts given during visit include:  AVS  Goals Sheet (revised)  Demonstrated degree of understanding via:  Teach Back   Monitoring/Evaluation:  Dietary intake, exercise, and body weight in 5 weeks.

## 2016-02-11 NOTE — Patient Instructions (Addendum)
Your lipid results, 12-19-15: Total cholesterol:  208 (should be <200) Triglycerides:  130 HDL:     44 (would be better if higher; will increase with exercise) LDL:    138 (should be <100)  - Weight loss will likely help each of the above numbers.  Other ways to improve your lipids:  Limit saturated fat (most animal fats, especially processed meats); choose mostly olive oil, nuts and seeds, and avocado mainly; limit sweets and starchy foods to keep triglycerides low.    Goals:   1. On at least 5 days a week, obtain twice as many veg's as protein or carbohydrate foods for both lunch and dinner.  - Use luncheon plates to help control portions.    - Make use of frozen veg's (microwaved?) for speed and convenience.   2. Workouts at the gym (at least 30 min) 2 X wk; walk at least 20 minutes 1 X wk.    - If your foot feels ok after the first walk, increase by about 2 minutes per week.    - Consider including some swimming in your [employer] workouts.   Complete your GOALS SHEET, and bring to follow-up on Dec 12 at 11:30.    - Work on changing your taste preference for soda:  The more you get away from having soda (even if it['s only once a week), the less appealing it will become.  You may want to experiment with small amounts of fruit juice diluted with seltzer/club soda.  (Ultimately, a good goal in terms of changing taste preference is to learn to like PLAIN seltzer or sparkling water that does NOT contain any sweeteners.)

## 2016-02-15 ENCOUNTER — Ambulatory Visit (INDEPENDENT_AMBULATORY_CARE_PROVIDER_SITE_OTHER): Payer: 59 | Admitting: Family Medicine

## 2016-02-15 ENCOUNTER — Encounter: Payer: Self-pay | Admitting: Family Medicine

## 2016-02-15 VITALS — BP 103/89 | HR 87 | Ht <= 58 in | Wt 140.0 lb

## 2016-02-15 DIAGNOSIS — G5603 Carpal tunnel syndrome, bilateral upper limbs: Secondary | ICD-10-CM

## 2016-02-15 DIAGNOSIS — M65332 Trigger finger, left middle finger: Secondary | ICD-10-CM | POA: Diagnosis not present

## 2016-02-15 DIAGNOSIS — M25572 Pain in left ankle and joints of left foot: Secondary | ICD-10-CM | POA: Diagnosis not present

## 2016-02-15 NOTE — Progress Notes (Signed)
  Jamie Patrick - 60 y.o. female MRN [id number]  Date of birth: 21-Jul-1955    SUBJECTIVE:     Chief Complaint: #1. Bilateral hand numbness and tingling. Left greater than right #2. Left hand swelling intermittently. #3. Trigger finger on the left. #4.had a MT fracture on LEFT foot earlier this year and is having continued pain with walking.  HPI: Has previously been diagnosed with carpal tunnel syndrome but was unable to get surgery. She has been wearing her night splints which helps some. Over the last couple of months her symptoms have worsened with increasing numbness per degree in the left hand. She's also noted some daytime swelling of the left hand, so significant that she's unable to wear her rings. She's not having swelling in the other hand or feet. No unusual weight gain. #3. Left long finger is getting stuck. Painful to extend.  right hand dominant Used to work as a Secretary/administrator and tha tis when her symptoms started #4. Left fifth metatarsal fracture. Was followed by orthopedics. Has been released from them. Overall is much better but she does a lot of standing or extended walking she'll have some pain. She would like to do more walking for weight loss. Pain is impeding her efforts. ROS:     No unusual weight change, no fevers, sweats, chills. Tingling bilateral hands in the median nerve distribution daily.  PERTINENT  PMH / PSH FH / / SH:  Past Medical, Surgical, Social, and Family History Reviewed & Updated in the EMR.  Pertinent findings include:  Unspecified viral hepatitis carrier Prediabetes. Osteopenia Proximal fifth metatarsal fracture June, 2017.  Nonsmoker  [employer] Full-time as a Research scientist (physical sciences). She is married. Her husband has leukemia.   OBJECTIVE: BP 103/89   Pulse 87   Ht 4\' 10"  (1.473 m)   Wt 140 lb (63.5 kg)   LMP 02/03/2009   BMI 29.26 kg/m   Physical Exam:  Vital signs are reviewed. GEN.: Well-developed female no acute distress WRISTS: Bilaterally full range  of motion flexion extension. Positive Tinel on the left. Bilaterally she has positive Phalen at 15 seconds.  HAND: There is no thenar atrophy noted in the hand.  Left long finger sticks in extension. There is a palpable pop at the A1 oplley area when she tries to extend the flexed finger. Mildly tender to palpation over this area. No notable joint swellings, no joint tenderness or erythema noted in the small joints of the hands.  FOOT: Left. No tenderness to palpation over the metatarsal ray 5. She has mild pes planus bilaterally.   INJECTION: Patient was given informed consent, signed copy in the chart. Appropriate time out was taken. Area prepped and draped in usual sterile fashion. One half cc of methylprednisolone 40 mg/ml plus  one half cc of 1% lidocaine without epinephrine was injected into the A1 pulley system portion of the flexor tendon of the left long finger using a(n) perpendicular approach. The patient tolerated the procedure well. There were no complications. Post procedure instructions were given.  ASSESSMENT & PLAN:  See problem based charting & AVS for pt instructions.

## 2016-02-15 NOTE — Assessment & Plan Note (Signed)
She's been wearing cockup splints at night with some improvement. I suspect this may be contributing to her daytime hand swelling however so I will have her discontinue them for the next 1-2 weeks to monitor her symptoms. I would like to go ahead and get nerve conduction studies see symptoms have been ongoing for quite a while. I'll see her back after the nerve conduction studies.

## 2016-02-15 NOTE — Assessment & Plan Note (Signed)
Left long finger trigger finger. Corticosteroid injection today. Follow-up 3 weeks.

## 2016-02-15 NOTE — Assessment & Plan Note (Signed)
I gave her some different insoles today see if the padding 1 help. If this does not, then I would see her back for custom molded orthotics.

## 2016-02-21 ENCOUNTER — Ambulatory Visit (INDEPENDENT_AMBULATORY_CARE_PROVIDER_SITE_OTHER): Payer: 59 | Admitting: Diagnostic Neuroimaging

## 2016-02-21 ENCOUNTER — Encounter (INDEPENDENT_AMBULATORY_CARE_PROVIDER_SITE_OTHER): Payer: Self-pay | Admitting: Diagnostic Neuroimaging

## 2016-02-21 DIAGNOSIS — M79642 Pain in left hand: Secondary | ICD-10-CM | POA: Diagnosis not present

## 2016-02-21 DIAGNOSIS — Z0289 Encounter for other administrative examinations: Secondary | ICD-10-CM

## 2016-02-21 DIAGNOSIS — M79641 Pain in right hand: Secondary | ICD-10-CM | POA: Diagnosis not present

## 2016-02-21 NOTE — Procedures (Signed)
   [facility]  NCS (NERVE CONDUCTION STUDY) WITH EMG (ELECTROMYOGRAPHY) REPORT   STUDY DATE: 02/21/16 PATIENT NAME: Jamie Patrick DOB: 1955/05/03 MRN: [id number]  ORDERING CLINICIAN: Dorcas Mcmurray  TECHNOLOGIST: Laretta Alstrom  ELECTROMYOGRAPHER: Earlean Polka. Carolie Mcilrath, MD  CLINICAL INFORMATION: 60 year old female with bilateral hand pain.  FINDINGS: NERVE CONDUCTION STUDY: Bilateral median motor responses have prolonged distal latencies (right 7.5 ms, left 4.5 ms), normal amplitudes, normal conduction velocities and normal F-wave latencies.  Bilateral ulnar motor responses and F wave latencies are normal.  Right median sensory response could not be obtained.  Left median sensory response has prolonged latency and normal amplitude.  Bilateral ulnar sensory responses are normal.    NEEDLE ELECTROMYOGRAPHY: Needle examination of right upper extremity deltoid, biceps, triceps, flexor carpi radialis, first dorsal interosseous and right C6-7 paraspinal muscles is normal.   IMPRESSION:  Abnormal study demonstrating: - Bilateral median neuropathies at the wrist consistent with bilateral carpal tunnel syndrome. This is more severe on the right side than compared to left side.    INTERPRETING PHYSICIAN:  Penni Bombard, MD Certified in Neurology, Neurophysiology and Neuroimaging  [facility] [address], [place], [place] [address] [phone]

## 2016-02-22 ENCOUNTER — Telehealth: Payer: Self-pay | Admitting: Family Medicine

## 2016-02-22 ENCOUNTER — Encounter: Payer: Self-pay | Admitting: Family Medicine

## 2016-02-22 NOTE — Telephone Encounter (Signed)
Can you please set up an appointmentfor ortho referral eval for carpal tunnel surgery. Had PNCV yesterday--B CTS. Appt please before 03/28/2016 Jamie Patrick

## 2016-02-22 NOTE — Telephone Encounter (Signed)
Is there a particular place you want her to have the surgery?

## 2016-02-25 ENCOUNTER — Other Ambulatory Visit: Payer: Self-pay | Admitting: *Deleted

## 2016-02-25 DIAGNOSIS — G5603 Carpal tunnel syndrome, bilateral upper limbs: Secondary | ICD-10-CM

## 2016-02-25 NOTE — Telephone Encounter (Signed)
Can u try Dr Dorna Leitz? [facility]! Dorcas Mcmurray

## 2016-02-25 NOTE — Telephone Encounter (Signed)
I got her an appointment with Dr. Berenice Primas on 03/04/16 at 9:15am I spoke with [facility] to let her know.

## 2016-03-04 DIAGNOSIS — G5601 Carpal tunnel syndrome, right upper limb: Secondary | ICD-10-CM | POA: Diagnosis not present

## 2016-03-04 DIAGNOSIS — M65332 Trigger finger, left middle finger: Secondary | ICD-10-CM | POA: Diagnosis not present

## 2016-03-04 DIAGNOSIS — G5602 Carpal tunnel syndrome, left upper limb: Secondary | ICD-10-CM | POA: Diagnosis not present

## 2016-03-05 ENCOUNTER — Other Ambulatory Visit: Payer: Self-pay | Admitting: Orthopedic Surgery

## 2016-03-06 ENCOUNTER — Encounter: Payer: Self-pay | Admitting: Women's Health

## 2016-03-06 ENCOUNTER — Ambulatory Visit
Admission: RE | Admit: 2016-03-06 | Discharge: 2016-03-06 | Disposition: A | Discharge status: Home / Self Care | Payer: 59 | Source: Ambulatory Visit | Attending: Internal Medicine | Admitting: Internal Medicine

## 2016-03-06 DIAGNOSIS — Z1231 Encounter for screening mammogram for malignant neoplasm of breast: Secondary | ICD-10-CM

## 2016-03-11 ENCOUNTER — Encounter (HOSPITAL_COMMUNITY): Payer: Self-pay | Admitting: Emergency Medicine

## 2016-03-11 ENCOUNTER — Ambulatory Visit (HOSPITAL_COMMUNITY)
Admission: EM | Admit: 2016-03-11 | Discharge: 2016-03-11 | Disposition: A | Discharge status: Home / Self Care | Payer: 59 | Attending: Family Medicine | Admitting: Family Medicine

## 2016-03-11 DIAGNOSIS — J069 Acute upper respiratory infection, unspecified: Secondary | ICD-10-CM

## 2016-03-11 MED ORDER — AZITHROMYCIN 250 MG PO TABS: 250.0000 mg | ORAL_TABLET | Freq: Every day | ORAL | 0 refills | Status: DC

## 2016-03-11 MED ORDER — IPRATROPIUM BROMIDE 0.06 % NA SOLN: 2.0000 | Freq: Four times a day (QID) | NASAL | 12 refills | Status: DC

## 2016-03-11 NOTE — ED Provider Notes (Signed)
CSN: [id number]     Arrival date & time 03/11/16  1105 History   None    Chief Complaint  Patient presents with  . URI   (Consider location/radiation/quality/duration/timing/severity/associated sxs/prior Treatment) Patient c/o sore throat green and yellow productive cough and sputum and URI sx's over a week.   The history is provided by the patient.  URI  Presenting symptoms: congestion and sore throat   Severity:  Moderate Onset quality:  Sudden Duration:  1 week Progression:  Worsening Chronicity:  New Relieved by:  OTC medications Worsened by:  Movement Ineffective treatments:  OTC medications Associated symptoms: headaches, sinus pain and sneezing     Past Medical History:  Diagnosis Date  . Allergic rhinitis   . Allergy   . Anal fissure   . Anxiety   . Anxiety and depression   . Colon polyps    BENIGN  . CTS (carpal tunnel syndrome)    question of  . Depression   . Dysplasia of cervix, low grade (CIN 1)    h/o CIN-1/CIN2  . Hypothyroidism   . Osteopenia    as reported by pt, DEXA @ gyn  . Prediabetes 09/01/2012  . Vitamin D deficiency    Past Surgical History:  Procedure Laterality Date  . APPENDECTOMY    . CHOLECYSTECTOMY  2010  . CRYOTHERAPY     FOR CERVICAL DYSPLASIA CIN-1/CIN-2  . TONSILLECTOMY    . uterine polyps  2003   Dr.Fernadez/ RESECTOSCOPIC POLYPECTOMY   Family History  Problem Relation Age of Onset  . Diabetes Brother     uncles, GM  . Hypertension Brother   . Breast cancer Other     distant cousins  . Osteoporosis Sister     2 sisters   . Heart attack Neg Hx   . Colon cancer Neg Hx    Social History  Substance Use Topics  . Smoking status: Never Smoker  . Smokeless tobacco: Never Used  . Alcohol use No     Comment:     OB History    Gravida Para Term Preterm AB Living   0             SAB TAB Ectopic Multiple Live Births                 Review of Systems  Constitutional: Negative.   HENT: Positive for congestion,  sinus pain, sneezing and sore throat.   Respiratory: Negative.   Cardiovascular: Negative.   Gastrointestinal: Negative.   Endocrine: Negative.   Genitourinary: Negative.   Musculoskeletal: Negative.   Allergic/Immunologic: Negative.   Neurological: Positive for headaches.  Hematological: Negative.   Psychiatric/Behavioral: Negative.     Allergies  Ceftriaxone sodium; Cephalosporins; and Sulfonamide derivatives  Home Medications   Prior to Admission medications   Medication Sig Start Date End Date Taking? Authorizing Provider  ALPRAZolam Duanne Moron) 0.5 MG tablet Take 1 tablet (0.5 mg total) by mouth at bedtime as needed for sleep. 12/19/15   Colon Branch, MD  azithromycin (ZITHROMAX) 250 MG tablet Take 1 tablet (250 mg total) by mouth daily. Take first 2 tablets together, then 1 every day until finished. 03/11/16   Lysbeth Penner, FNP  Calcium Carbonate-Vitamin D (CALCIUM + D PO) Take 1 tablet by mouth daily.     Historical Provider, MD  cyclobenzaprine (FLEXERIL) 10 MG tablet Take 1 tablet (10 mg total) by mouth at bedtime as needed (back pain). Patient not taking: Reported on 03/11/2016 01/03/16   [facility]  Ladona Horns, MD  escitalopram (LEXAPRO) 20 MG tablet Take 1 tablet (20 mg total) by mouth daily. 12/11/15   Colon Branch, MD  fish oil-omega-3 fatty acids 1000 MG capsule Take 2 g by mouth daily.    Historical Provider, MD  ipratropium (ATROVENT) 0.06 % nasal spray Place 2 sprays into both nostrils 4 (four) times daily. 03/11/16   Lysbeth Penner, FNP  levothyroxine (SYNTHROID, LEVOTHROID) 50 MCG tablet Take 1 tablet (50 mcg total) by mouth daily before breakfast. 01/21/16   Colon Branch, MD  MAGNESIUM PO Take 275 mg by mouth.    Historical Provider, MD  metroNIDAZOLE (METROCREAM) 0.75 % cream Apply topically 2 (two) times daily. 03/09/15   Kinnie Feil, MD  polyethylene glycol (MIRALAX / GLYCOLAX) packet Take 17 g by mouth as needed. Reported on 06/02/2015    Historical Provider, MD   Meds Ordered  and Administered this Visit  Medications - No data to display  BP (!) 120/46 (BP Location: Left Arm)   Pulse 72   Temp 98.6 F (37 C) (Oral)   Resp 18   LMP 02/03/2009   SpO2 98%  No data found.   Physical Exam  Constitutional: She is oriented to person, place, and time. She appears well-developed and well-nourished.  HENT:  Head: Normocephalic and atraumatic.  Right Ear: External ear normal.  Left Ear: External ear normal.  Nose: Nose normal.  Mouth/Throat: Oropharynx is clear and moist.  Eyes: Conjunctivae and EOM are normal. Pupils are equal, round, and reactive to light.  Neck: Normal range of motion. Neck supple.  Cardiovascular: Normal rate, regular rhythm and normal heart sounds.   Pulmonary/Chest: Effort normal and breath sounds normal.  Abdominal: Soft. Bowel sounds are normal.  Musculoskeletal: Normal range of motion.  Neurological: She is alert and oriented to person, place, and time.  Nursing note and vitals reviewed.   Urgent Care Course   Clinical Course     Procedures (including critical care time)  Labs Review Labs Reviewed - No data to display  Imaging Review No results found.   Visual Acuity Review  Right Eye Distance:   Left Eye Distance:   Bilateral Distance:    Right Eye Near:   Left Eye Near:    Bilateral Near:         MDM   1. Acute upper respiratory infection    Atrovent nasal spray 0.06% 2 sprays per nostril qid prn #56ml Zpak as directed  Push po fluids, rest, tylenol and motrin otc prn as directed for fever, arthralgias, and myalgias.  Follow up prn if sx's continue or persist.    Lysbeth Penner, FNP 03/11/16 1247

## 2016-03-11 NOTE — ED Triage Notes (Signed)
2 day history of green sputum, complains of throat congestion, loss of voice, hot and cold spells.  Denies fever.

## 2016-03-14 ENCOUNTER — Encounter: Payer: Self-pay | Admitting: Family Medicine

## 2016-03-15 ENCOUNTER — Encounter: Payer: Self-pay | Admitting: Family Medicine

## 2016-03-18 ENCOUNTER — Encounter: Payer: Self-pay | Admitting: Family Medicine

## 2016-03-18 ENCOUNTER — Encounter: Payer: Self-pay | Admitting: Internal Medicine

## 2016-03-18 ENCOUNTER — Ambulatory Visit: Payer: 59 | Admitting: Family Medicine

## 2016-03-18 ENCOUNTER — Ambulatory Visit (INDEPENDENT_AMBULATORY_CARE_PROVIDER_SITE_OTHER): Payer: 59 | Admitting: Internal Medicine

## 2016-03-18 VITALS — BP 128/74 | HR 73 | Temp 97.8°F | Resp 12 | Ht <= 58 in | Wt 144.1 lb

## 2016-03-18 DIAGNOSIS — F419 Anxiety disorder, unspecified: Secondary | ICD-10-CM

## 2016-03-18 DIAGNOSIS — F418 Other specified anxiety disorders: Secondary | ICD-10-CM

## 2016-03-18 DIAGNOSIS — E039 Hypothyroidism, unspecified: Secondary | ICD-10-CM

## 2016-03-18 DIAGNOSIS — R1310 Dysphagia, unspecified: Secondary | ICD-10-CM

## 2016-03-18 DIAGNOSIS — F329 Major depressive disorder, single episode, unspecified: Secondary | ICD-10-CM

## 2016-03-18 LAB — TSH: TSH: 2.3 u[IU]/mL (ref 0.35–4.50)

## 2016-03-18 MED ORDER — ESCITALOPRAM OXALATE 20 MG PO TABS: 20.0000 mg | ORAL_TABLET | Freq: Every day | ORAL | 0 refills | Status: DC

## 2016-03-18 MED ORDER — LEVOTHYROXINE SODIUM 50 MCG PO TABS: 50.0000 ug | ORAL_TABLET | Freq: Every day | ORAL | 0 refills | Status: DC

## 2016-03-18 NOTE — Progress Notes (Signed)
Pre visit review using our clinic review tool, if applicable. No additional management support is needed unless otherwise documented below in the visit note. 

## 2016-03-18 NOTE — Assessment & Plan Note (Signed)
Hypothyroidism: Check a TSH per patient request, refill medications. Dysphagia: Ongoing problem, recommend GI referral, patient declined, explained that we need to rule out potential serious things, even cancer. States she can not  do it at this time mostly due to finances. Anxiety depression and insomnia: Slightly worse lately, her job was terminated recently. Denies suicidal ideas. Counseled briefly today, recommend to continue seeing a counselor, cont SSRIs. Additionally, states that she is not using Flexeril anymore, it "stopped working" and is back on Xanax to help insomnia. RTC 3 months.

## 2016-03-18 NOTE — Patient Instructions (Signed)
GO TO THE LAB : Get the blood work     GO TO THE FRONT DESK Schedule your next appointment for a  Check up in 3 months   -----  Rest, fluids , tylenol  For cough:  Take Mucinex DM twice a day as needed until better  For nasal congestion: continue the nasal spray   Avoid decongestants such as  Pseudoephedrine or phenylephrine     Call if not gradually better over the next  10 days

## 2016-03-18 NOTE — Progress Notes (Signed)
Subjective:    Patient ID: Jamie Patrick, female    DOB: 08/22/1955, 60 y.o.   MRN: [id number]  DOS:  03/18/2016 Type of visit - description : acute Interval history: Was seen recently with upper respiratory infection, prescribed a Z-Pak and a nasal spray. Better but not 100%. Still having some sore throat, cough and sputum production. Hypothyroidism: Request a refill and a  TSH because recently lost her job and is afraid she won't be able to do blood work in the next few months. Depression: Lost her job, obviously stressed about it. Continue with dysphagia, to both liquids and solids, symptoms happen early during the swallowing  process.  Review of Systems Denies fever chills. No chest pain, mild difficulty breathing with the onset of the respiratory symptoms Denies any suicidal ideas or weight loss   Past Medical History:  Diagnosis Date  . Allergy   . Anal fissure   . Anxiety   . Colon polyps    BENIGN  . CTS (carpal tunnel syndrome)    question of  . Depression   . Dysplasia of cervix, low grade (CIN 1)    h/o CIN-1/CIN2  . Hypothyroidism   . Osteopenia    as reported by pt, DEXA @ gyn  . Prediabetes 09/01/2012  . Vitamin D deficiency     Past Surgical History:  Procedure Laterality Date  . APPENDECTOMY    . CHOLECYSTECTOMY  2010  . CRYOTHERAPY     FOR CERVICAL DYSPLASIA CIN-1/CIN-2  . TONSILLECTOMY    . uterine polyps  2003   Dr.Fernadez/ RESECTOSCOPIC POLYPECTOMY    Social History   Social History  . Marital status: Married    Spouse name: N/A  . Number of children: 0  . Years of education: N/A   Occupational History  . 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 Smoking status: Never Smoker  . Smokeless tobacco: Never Used  . Alcohol use No     Comment:    . Drug use: No  . Sexual activity: No   Other Topics Concern  . Not on file   Social History Narrative   Original from [place]-          Medication List       Accurate as of 03/18/16  7:13 PM. Always use your most recent med list.          ALPRAZolam 0.5 MG tablet Commonly known as:  XANAX Take 1 tablet (0.5 mg total) by mouth at bedtime as needed for sleep.   CALCIUM + D PO Take 1 tablet by mouth daily.   escitalopram 20 MG tablet Commonly known as:  LEXAPRO Take 1 tablet (20 mg total) by mouth daily.   fish oil-omega-3 fatty acids 1000 MG capsule Take 2 g by mouth daily.   ipratropium 0.06 % nasal spray Commonly known as:  ATROVENT Place 2 sprays into both nostrils 4 (four) times daily.   levothyroxine 50 MCG tablet Commonly known as:  SYNTHROID, LEVOTHROID Take 1 tablet (50 mcg total) by mouth daily before breakfast.   MAGNESIUM PO Take 275 mg by mouth.   metroNIDAZOLE 0.75 % cream Commonly known as:  METROCREAM Apply topically 2 (two) times daily.   polyethylene glycol packet Commonly known as:  MIRALAX / GLYCOLAX Take 17 g by mouth as needed. Reported on 06/02/2015          Objective:   Physical Exam BP 128/74 (  BP Location: Left Arm, Patient Position: Sitting, Cuff Size: Small)   Pulse 73   Temp 97.8 F (36.6 C) (Oral)   Resp 12   Ht 4\' 10"  (1.473 m)   Wt 144 lb 2 oz (65.4 kg)   LMP 02/03/2009   SpO2 97%   BMI 30.12 kg/m  General:   Well developed, well nourished . NAD.  HEENT:  Normocephalic . Face symmetric, atraumatic TMs normal, throat symmetric, no red, sinuses no TTP, nose congested Lungs:  CTA B Normal respiratory effort, no intercostal retractions, no accessory muscle use. Heart: RRR,  no murmur.  No pretibial edema bilaterally  Skin: Not pale. Not jaundice Neurologic:  alert & oriented X3.  Speech normal, gait appropriate for age and unassisted Psych--  Cognition and judgment appear intact.  Cooperative with normal attention span and concentration.  Tearful  =============================================================================  Assessment   Prediabetes Hypothyroidism Anxiety , depression , insomnia Intolerant to Abilify,Viibrid, used to see Dr Toy Care trazadone (didn't work), elavil (stopped working), Microbiologist (++s/e) Currently on xanax -lexapro Allergic rhinitis Osteopenia -- per gyn, last DEXA 07-2015 Vitamin D deficiency: On oral supplements Menopausal  h/o dysplasia of the cervix CN1/CN2 Rosacea-- sees derm     PLAN: Hypothyroidism: Check a TSH per patient request, refill medications. Dysphagia: Ongoing problem, recommend GI referral, patient declined, explained that we need to rule out potential serious things, even cancer. States she can not  do it at this time mostly due to finances. Anxiety depression and insomnia: Slightly worse lately, her job was terminated recently. Denies suicidal ideas. Counseled briefly today, recommend to continue seeing a counselor, cont SSRIs. Additionally, states that she is not using Flexeril anymore, it "stopped working" and is back on Xanax to help insomnia. RTC 3 months.

## 2016-03-19 ENCOUNTER — Encounter: Payer: Self-pay | Admitting: Internal Medicine

## 2016-03-19 ENCOUNTER — Telehealth: Payer: Self-pay | Admitting: Internal Medicine

## 2016-03-19 ENCOUNTER — Encounter: Payer: Self-pay | Admitting: Diagnostic Neuroimaging

## 2016-03-19 NOTE — Telephone Encounter (Signed)
Patient's spouse called stating that the patient was seen yesterday and was supposed to get a prescription for her cough and congestion. Patient's spouse states that she is even worse today than she was yesterday. Please advise.   Phone: [phone]

## 2016-03-19 NOTE — Telephone Encounter (Signed)
MyChart message received from Pt. Forwarded to PCP.

## 2016-03-20 ENCOUNTER — Encounter: Payer: Self-pay | Admitting: Internal Medicine

## 2016-03-20 ENCOUNTER — Telehealth: Payer: Self-pay | Admitting: *Deleted

## 2016-03-20 NOTE — Telephone Encounter (Signed)
Replied to patient per My Chart; Dr Leta Baptist recommends she discuss surgery with PCP and orthopedic surgeon.

## 2016-03-20 NOTE — Telephone Encounter (Signed)
Recommend to discuss with PCP and ortho clinic. -VRP

## 2016-03-21 ENCOUNTER — Encounter: Payer: Self-pay | Admitting: Family Medicine

## 2016-03-21 ENCOUNTER — Telehealth: Payer: Self-pay | Admitting: Internal Medicine

## 2016-03-21 ENCOUNTER — Other Ambulatory Visit: Payer: Self-pay | Admitting: Internal Medicine

## 2016-03-21 ENCOUNTER — Ambulatory Visit (INDEPENDENT_AMBULATORY_CARE_PROVIDER_SITE_OTHER): Payer: 59 | Admitting: Family Medicine

## 2016-03-21 VITALS — BP 105/26 | Ht 59.0 in | Wt 138.0 lb

## 2016-03-21 DIAGNOSIS — G5603 Carpal tunnel syndrome, bilateral upper limbs: Secondary | ICD-10-CM

## 2016-03-21 MED ORDER — AZELASTINE HCL 0.1 % NA SOLN: 2.0000 | Freq: Every evening | NASAL | 3 refills | Status: DC | PRN

## 2016-03-21 MED ORDER — METHYLPREDNISOLONE ACETATE 40 MG/ML IJ SUSP
40.0000 mg | Freq: Once | INTRAMUSCULAR | Status: AC
  Administered 2016-03-21: 40 mg via INTRA_ARTICULAR

## 2016-03-21 NOTE — Telephone Encounter (Signed)
Patient's spouse called stating that patient was seen 03/18/16 and she is not feeling any better. She is still cough even worse and has a lot of mucus. Patient's spouse is wondering if there is anything else she can take that may help? Please advise.  I advised patient's spouse that a mychart message was sent this morning.   Patient phone: [phone]

## 2016-03-21 NOTE — Telephone Encounter (Signed)
See MyChart message

## 2016-03-22 ENCOUNTER — Ambulatory Visit (INDEPENDENT_AMBULATORY_CARE_PROVIDER_SITE_OTHER): Payer: 59 | Admitting: Family Medicine

## 2016-03-22 VITALS — BP 124/68 | HR 84 | Temp 98.3°F | Resp 16 | Ht 59.0 in | Wt 145.0 lb

## 2016-03-22 DIAGNOSIS — J069 Acute upper respiratory infection, unspecified: Secondary | ICD-10-CM

## 2016-03-22 DIAGNOSIS — B9689 Other specified bacterial agents as the cause of diseases classified elsewhere: Secondary | ICD-10-CM

## 2016-03-22 DIAGNOSIS — H109 Unspecified conjunctivitis: Secondary | ICD-10-CM

## 2016-03-22 MED ORDER — ERYTHROMYCIN 5 MG/GM OP OINT: 1.0000 "application " | TOPICAL_OINTMENT | Freq: Every day | OPHTHALMIC | 0 refills | Status: DC

## 2016-03-22 MED ORDER — POLYMYXIN B-TRIMETHOPRIM 10000-0.1 UNIT/ML-% OP SOLN: 1.0000 [drp] | OPHTHALMIC | 0 refills | Status: DC

## 2016-03-22 MED ORDER — ALIGN PO CAPS: 1.0000 | ORAL_CAPSULE | Freq: Every day | ORAL | 0 refills | Status: DC

## 2016-03-22 MED ORDER — FLUCONAZOLE 150 MG PO TABS: 150.0000 mg | ORAL_TABLET | Freq: Once | ORAL | 0 refills | Status: AC

## 2016-03-22 MED ORDER — AMOXICILLIN-POT CLAVULANATE 875-125 MG PO TABS: 1.0000 | ORAL_TABLET | Freq: Two times a day (BID) | ORAL | 0 refills | Status: DC

## 2016-03-22 NOTE — Patient Instructions (Addendum)
     IF you received an x-ray today, you will receive an invoice from [facility]. Please contact [facility] at [phone] with questions or concerns regarding your invoice.   IF you received labwork today, you will receive an invoice from [place]. Please contact [facility] at [phone] with questions or concerns regarding your invoice.   Our billing staff will not be able to assist you with questions regarding bills from these companies.  You will be contacted with the lab results as soon as they are available. The fastest way to get your results is to activate your My Chart account. Instructions are located on the last page of this paperwork. If you have not heard from [place] regarding the results in 2 weeks, please contact this office.      Bacterial Conjunctivitis Introduction Bacterial conjunctivitis is an infection of your conjunctiva. This is the clear membrane that covers the white part of your eye and the inner surface of your eyelid. This condition can make your eye:  Red or pink.  Itchy. This condition is caused by bacteria. This condition spreads very easily from person to person (is contagious) and from one eye to the other eye. Follow these instructions at home: Medicines  Take or apply your antibiotic medicine as told by your doctor. Do not stop taking or applying the antibiotic even if you start to feel better.  Take or apply over-the-counter and prescription medicines only as told by your doctor.  Do not touch your eyelid with the eye drop bottle or the ointment tube. Managing discomfort  Wipe any fluid from your eye with a warm, wet washcloth or a cotton ball.  Place a cool, clean washcloth on your eye. Do this for 10-20 minutes, 3-4 times per day. General instructions  Do not wear contact lenses until the irritation is gone. Wear glasses until your doctor says it is okay to wear contacts.  Do not wear eye makeup until your symptoms are  gone. Throw away any old makeup.  Change or wash your pillowcase every day.  Do not share towels or washcloths with anyone.  Wash your hands often with soap and water. Use paper towels to dry your hands.  Do not touch or rub your eyes.  Do not drive or use heavy machinery if your vision is blurry. Contact a doctor if:  You have a fever.  Your symptoms do not get better after 10 days. Get help right away if:  You have a fever and your symptoms suddenly get worse.  You have very bad pain when you move your eye.  Your face:  Hurts.  Is red.  Is swollen.  You have sudden loss of vision. This information is not intended to replace advice given to you by your health care provider. Make sure you discuss any questions you have with your health care provider. Document Released: 01/01/2008 Document Revised: 08/30/2015 Document Reviewed: 01/04/2015  2017 [employer]vier

## 2016-03-22 NOTE — Progress Notes (Signed)
Chief Complaint  Patient presents with  . Conjunctivitis    both eyes, mostly left, today  . Nasal Congestion    x 2 weeks  . Sore Throat    HPI  She reports that she has burning, watery, red eye with crusty drainage worse on the right than the left She denies pressure in the eye with movement of the eye and denies pain with movement of the eye She reports light sensitivity and some blurry vision.  She states that she has 2 weeks of nasal congestion She has not worked all week because she lost her job She reports that she has been home She reports that was seen 03/11/2016 for acute URI and was given Atrovent nasal spray and zpak    Past Medical History:  Diagnosis Date  . Allergy   . Anal fissure   . Anxiety   . Colon polyps    BENIGN  . CTS (carpal tunnel syndrome)    question of  . Depression   . Dysplasia of cervix, low grade (CIN 1)    h/o CIN-1/CIN2  . Hypothyroidism   . Osteopenia    as reported by pt, DEXA @ gyn  . Prediabetes 09/01/2012  . Vitamin D deficiency     Current Outpatient Prescriptions  Medication Sig Dispense Refill  . ALPRAZolam (XANAX) 0.5 MG tablet Take 1 tablet (0.5 mg total) by mouth at bedtime as needed for sleep. 30 tablet 5  . Calcium Carbonate-Vitamin D (CALCIUM + D PO) Take 1 tablet by mouth daily.     Marland Kitchen escitalopram (LEXAPRO) 20 MG tablet Take 1 tablet (20 mg total) by mouth daily. 90 tablet 0  . fish oil-omega-3 fatty acids 1000 MG capsule Take 2 g by mouth daily.    Marland Kitchen ipratropium (ATROVENT) 0.06 % nasal spray Place 2 sprays into both nostrils 4 (four) times daily. 15 mL 12  . levothyroxine (SYNTHROID, LEVOTHROID) 50 MCG tablet Take 1 tablet (50 mcg total) by mouth daily before breakfast. 90 tablet 0  . MAGNESIUM PO Take 275 mg by mouth.    . metroNIDAZOLE (METROCREAM) 0.75 % cream Apply topically 2 (two) times daily. 45 g 0  . amoxicillin-clavulanate (AUGMENTIN) 875-125 MG tablet Take 1 tablet by mouth 2 (two) times daily. 20 tablet  0  . azelastine (ASTELIN) 0.1 % nasal spray Place 2 sprays into both nostrils at bedtime as needed for rhinitis. Use in each nostril as directed (Patient not taking: Reported on 03/22/2016) 30 mL 3  . bifidobacterium infantis (ALIGN) capsule Take 1 capsule by mouth daily. 30 capsule 0  . fluconazole (DIFLUCAN) 150 MG tablet Take 1 tablet (150 mg total) by mouth once. Take 2nd tablet in 3 days 2 tablet 0  . polyethylene glycol (MIRALAX / GLYCOLAX) packet Take 17 g by mouth as needed. Reported on 06/02/2015    . trimethoprim-polymyxin b (POLYTRIM) ophthalmic solution Place 1 drop into both eyes every 4 (four) hours. 10 mL 0   No current facility-administered medications for this visit.     Allergies:  Allergies  Allergen Reactions  . Ceftriaxone Sodium     REACTION: nausea  . Cephalosporins Nausea And Vomiting  . Sulfonamide Derivatives     REACTION: near syncope, N\T\V    Past Surgical History:  Procedure Laterality Date  . APPENDECTOMY    . CHOLECYSTECTOMY  2010  . CRYOTHERAPY     FOR CERVICAL DYSPLASIA CIN-1/CIN-2  . TONSILLECTOMY    . uterine polyps  2003   Dr.Fernadez/  RESECTOSCOPIC POLYPECTOMY    Social History   Social History  . Marital status: Married    Spouse name: N/A  . Number of children: 0  . Years of education: N/A   Occupational History  . 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 Smoking status: Never Smoker  . Smokeless tobacco: Never Used  . Alcohol use No     Comment:    . Drug use: No  . Sexual activity: No   Other Topics Concern  . None   Social History Narrative   Original from [place]-      Review of Systems  Constitutional: Positive for chills. Negative for fever.  HENT: Positive for congestion and sinus pain. Negative for ear discharge, ear pain, sore throat and tinnitus.   Eyes: Positive for blurred vision, photophobia, discharge and redness. Negative for double vision and pain.  Respiratory: Negative for  cough, shortness of breath and wheezing.   Cardiovascular: Negative for chest pain and palpitations.  Gastrointestinal: Negative for abdominal pain, nausea and vomiting.  Skin: Negative for itching and rash.    Objective: Vitals:   03/22/16 1519  BP: 124/68  Pulse: 84  Resp: 16  Temp: 98.3 F (36.8 C)  Weight: 145 lb (65.8 kg)  Height: 4\' 11"  (1.499 m)    Physical Exam General: alert, oriented, in NAD Head: normocephalic, atraumatic, no sinus tenderness Eyes: EOM intact, no scleral icterus, +bilateral conjunctival injection, right eye with purulent drainage Ears: TM clear bilaterally Throat: no pharyngeal exudate or erythema Lymph: no posterior auricular, submental or cervical lymph adenopathy Heart: normal rate, normal sinus rhythm, no murmurs Lungs: clear to auscultation bilaterally, no wheezing   Assessment and Plan Buena was seen today for conjunctivitis, nasal congestion and sore throat.  Diagnoses and all orders for this visit:  Bacterial conjunctivitis of both eyes Acute upper respiratory infection Will treat for acute URI and pink eye with oral and ophthalmic antibiotics Gave diflucan in case yeast infection occurs Advised align since pt will be on a repeated course of abx and recently completed zpak -     amoxicillin-clavulanate (AUGMENTIN) 875-125 MG tablet; Take 1 tablet by mouth 2 (two) times daily. -     fluconazole (DIFLUCAN) 150 MG tablet; Take 1 tablet (150 mg total) by mouth once. Take 2nd tablet in 3 days -     bifidobacterium infantis (ALIGN) capsule; Take 1 capsule by mouth daily. -     trimethoprim-polymyxin b (POLYTRIM) ophthalmic solution; Place 1 drop into both eyes every 4 (four) hours.     [place]

## 2016-03-24 NOTE — Progress Notes (Signed)
  DALAYLA ARLINGHAUS - 60 y.o. female MRN [id number]  Date of birth: 1955/11/08    SUBJECTIVE:      Chief Complaint:/ HPI:   Left wrist pain with known dx CTS. Has lost her job and will have to postpone her CT surgery. Wants injection.   ROS:     Numbness in median nerve distribution B but L>R.  PERTINENT  PMH / PSH FH / / SH:  Past Medical, Surgical, Social, and Family History Reviewed & Updated in the EMR.  Pertinent findings include:  Abnormal NCS showing B CTS No personal hx Dm  OBJECTIVE: BP (!) 105/26   Ht 4\' 11"  (1.499 m)   Wt 138 lb (62.6 kg)   LMP 02/03/2009   BMI 27.87 kg/m   Physical Exam:  Vital signs are reviewed. WDWNNAD\ Left wrist + phalens and tinelss. FROM flexion and extension  INJECTION: Patient was given informed consent, signed copy in the chart. Appropriate time out was taken. Area prepped and draped in usual sterile fashion. 1 cc of methylprednisolone 40 mg/ml plus  1 cc of 1% lidocaine without epinephrine was injected into the Left carpal tunnel using a(n) volaapproach. The patient tolerated the procedure well. There were no complications. Post procedure instructions were given.   ASSESSMENT & PLAN:

## 2016-03-24 NOTE — Assessment & Plan Note (Signed)
Has to delay surgery. Wants CSI on left whch we did today and she will f/u prn

## 2016-03-26 ENCOUNTER — Ambulatory Visit (HOSPITAL_BASED_OUTPATIENT_CLINIC_OR_DEPARTMENT_OTHER): Admission: RE | Admit: 2016-03-26 | Payer: 59 | Source: Ambulatory Visit | Admitting: Orthopedic Surgery

## 2016-03-26 ENCOUNTER — Encounter (HOSPITAL_BASED_OUTPATIENT_CLINIC_OR_DEPARTMENT_OTHER): Admission: RE | Payer: Self-pay | Source: Ambulatory Visit

## 2016-03-26 SURGERY — CARPAL TUNNEL RELEASE
Anesthesia: Choice | Laterality: Right

## 2016-03-28 ENCOUNTER — Encounter: Payer: Self-pay | Admitting: Women's Health

## 2016-03-28 ENCOUNTER — Ambulatory Visit (INDEPENDENT_AMBULATORY_CARE_PROVIDER_SITE_OTHER): Payer: 59 | Admitting: Women's Health

## 2016-03-28 VITALS — BP 126/70 | Ht 59.0 in | Wt 142.0 lb

## 2016-03-28 DIAGNOSIS — K640 First degree hemorrhoids: Secondary | ICD-10-CM | POA: Diagnosis not present

## 2016-03-28 DIAGNOSIS — Z01419 Encounter for gynecological examination (general) (routine) without abnormal findings: Secondary | ICD-10-CM | POA: Diagnosis not present

## 2016-03-28 MED ORDER — HYDROCORTISONE ACE-PRAMOXINE 2.5-1 % RE CREA: 1.0000 "application " | TOPICAL_CREAM | Freq: Three times a day (TID) | RECTAL | 0 refills | Status: DC

## 2016-03-28 NOTE — Progress Notes (Signed)
PAYTON BORCHERS [birth year] [id number]    History:    Presents for annual exam.  Postmenopausal/no bleeding/no HRT. Continues to have some hot flushes. 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 Primary care manages hypothyroidism, anxiety and depression. 2017 T score -1.8 at left femoral neck FRAX 9%/0.5%. Not sexually active husband prostate cancer. 2017 negative colonoscopy, history of hemorrhoids. Normal  mammogram history.  Past medical history, past surgical history, family history and social history were all reviewed and documented in the EPIC chart. 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 Brother diabetes and hypertension.  ROS:  A ROS was performed and pertinent positives and negatives are included.  Exam:  Vitals:   03/28/16 1029  BP: 126/70  Weight: 142 lb (64.4 kg)  Height: 4\' 11"  (1.499 m)   Body mass index is 28.68 kg/m.   General appearance:  Normal Thyroid:  Symmetrical, normal in size, without palpable masses or nodularity. Respiratory  Auscultation:  Clear without wheezing or rhonchi Cardiovascular  Auscultation:  Regular rate, without rubs, murmurs or gallops  Edema/varicosities:  Not grossly evident Abdominal  Soft,nontender, without masses, guarding or rebound.  Liver/spleen:  No organomegaly noted  Hernia:  None appreciated  Skin  Inspection:  Grossly normal   Breasts: Examined lying and sitting.     Right: Without masses, retractions, discharge or axillary adenopathy.     Left: Without masses, retractions, discharge or axillary adenopathy. Gentitourinary   Inguinal/mons:  Normal without inguinal adenopathy  External genitalia:  Normal  BUS/Urethra/Skene's glands:  Normal  Vagina:  Normal  Cervix:  Normal  Uterus:   normal in size, shape and contour.  Midline and mobile  Adnexa/parametria:     Rt: Without masses or tenderness.   Lt: Without masses or tenderness.  Anus and  perineum: Normal  Digital rectal exam: Normal sphincter tone without palpated masses or tenderness  Assessment/Plan:  61 y.o. MHF G0 for annual exam.     Postmenopausal/no bleeding/no HRT 2000 LEEP for CIN-05/2000 cryo-normal Paps after Hypothyroidism, anxiety/depression-primary care manages labs and meds Osteopenia without elevated FRAX Situational stress-recent job loss  Plan: SBE's, continue annual screening mammogram. Encouraged counseling, leisure activities, calcium rich diet, vitamin D 2000 daily encouraged. Home safety, fall prevention and importance of weightbearing exercise reviewed. History of hemorrhoids prescription for Analpram given. UA, Pap normal with negative HR HPV typing 2015 and 2016. New screening guidelines reviewed.    Huel Cote [facility], 11:34 AM 03/28/2016

## 2016-03-28 NOTE — Patient Instructions (Signed)

## 2016-03-29 LAB — URINALYSIS W MICROSCOPIC + REFLEX CULTURE
BACTERIA UA: NONE SEEN [HPF]
Bilirubin Urine: NEGATIVE
CRYSTALS: NONE SEEN [HPF]
Casts: NONE SEEN [LPF]
GLUCOSE, UA: NEGATIVE
HGB URINE DIPSTICK: NEGATIVE
KETONES UR: NEGATIVE
LEUKOCYTES UA: NEGATIVE
Nitrite: NEGATIVE
PROTEIN: NEGATIVE
RBC / HPF: NONE SEEN RBC/HPF (ref <=2)
SQUAMOUS EPITHELIAL / LPF: NONE SEEN [HPF] (ref <=5)
Specific Gravity, Urine: 1.007 (ref 1.001–1.035)
WBC UA: NONE SEEN WBC/HPF (ref <=5)
Yeast: NONE SEEN [HPF]
pH: 7.5 (ref 5.0–8.0)

## 2016-04-02 ENCOUNTER — Other Ambulatory Visit: Payer: Self-pay | Admitting: Women's Health

## 2016-04-02 ENCOUNTER — Encounter: Payer: Self-pay | Admitting: Women's Health

## 2016-04-02 DIAGNOSIS — B373 Candidiasis of vulva and vagina: Secondary | ICD-10-CM

## 2016-04-02 DIAGNOSIS — B3731 Acute candidiasis of vulva and vagina: Secondary | ICD-10-CM

## 2016-04-02 MED ORDER — FLUCONAZOLE 150 MG PO TABS: 150.0000 mg | ORAL_TABLET | Freq: Once | ORAL | 1 refills | Status: AC

## 2016-04-03 ENCOUNTER — Encounter: Payer: 59 | Admitting: Women's Health

## 2016-05-01 ENCOUNTER — Telehealth: Payer: Self-pay | Admitting: Internal Medicine

## 2016-05-01 NOTE — Telephone Encounter (Signed)
Immunization record printed and mailed to Pt.

## 2016-05-01 NOTE — Telephone Encounter (Signed)
Patient called stating that she is starting a new job and she needs her immunization records for the past two years. She would like them mailed to her, address on file is correct. Please advise  Phone: [phone]

## 2016-05-05 ENCOUNTER — Ambulatory Visit: Payer: 59 | Admitting: Internal Medicine

## 2016-05-07 NOTE — Telephone Encounter (Signed)
Immunization record printed and placed at front desk.

## 2016-05-07 NOTE — Telephone Encounter (Signed)
Patient never received immunizations and would like to pick up today, please advise

## 2016-06-12 ENCOUNTER — Other Ambulatory Visit: Payer: Self-pay | Admitting: Internal Medicine

## 2016-06-12 MED ORDER — ESCITALOPRAM OXALATE 20 MG PO TABS: 20.0000 mg | ORAL_TABLET | Freq: Every day | ORAL | 0 refills | Status: DC

## 2016-07-03 ENCOUNTER — Other Ambulatory Visit: Payer: Self-pay | Admitting: Internal Medicine

## 2016-07-07 ENCOUNTER — Encounter: Payer: Self-pay | Admitting: Internal Medicine

## 2016-07-18 ENCOUNTER — Ambulatory Visit (INDEPENDENT_AMBULATORY_CARE_PROVIDER_SITE_OTHER): Payer: Commercial Managed Care - PPO | Admitting: Family Medicine

## 2016-07-18 ENCOUNTER — Encounter: Payer: Self-pay | Admitting: Family Medicine

## 2016-07-18 DIAGNOSIS — M542 Cervicalgia: Secondary | ICD-10-CM

## 2016-07-18 DIAGNOSIS — G5603 Carpal tunnel syndrome, bilateral upper limbs: Secondary | ICD-10-CM

## 2016-07-18 NOTE — Assessment & Plan Note (Signed)
I think pain she's having now is all musculoskeletal, result of stress and work habit. We'll put her on rehabilitation program with focus on overhead press, lateral raise, pushups. Discussed benign nature issues. Follow-up when necessary

## 2016-07-18 NOTE — Assessment & Plan Note (Signed)
Again discussed issues with the carpal tunnel. Due to her job change she was unable to get her surgery. Given the severe nature of her symptoms and nerve conduction studies on the right, I would recommend she reconsider getting surgery first available opportunity.

## 2016-07-18 NOTE — Progress Notes (Signed)
  Jamie Patrick - 61 y.o. female MRN [id number]  Date of birth: Jul 23, 1955    SUBJECTIVE:      Chief Complaint:/ HPI:   Bilateral posterior shoulder/neck pain with some pain in the left upper arm. Worse over the last few weeks. She's having some sharp stinging pains in the left upper arm. 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 She also spent a week and a half driving back and forth to [facility] for training program. She thinks S when her neck pain really got bad. No headaches.   ROS:     No fever. No problems swallowing.  PERTINENT  PMH / PSH FH / / SH:  Past Medical, Surgical, Social, and Family History Reviewed & Updated in the EMR.  Pertinent findings include:  Bilateral carpal tunnel conduction study showing severe on right, moderate to severe on left. History of prediabetes Nonsmoker  OBJECTIVE: BP (!) 88/61   Ht 4\' 10"  (1.473 m)   Wt 142 lb (64.4 kg)   LMP 02/03/2009   BMI 29.68 kg/m   Physical Exam:  Vital signs are reviewed. GEN.: Well-developed female no acute distress  NECK: Mild degenerative palpation at the occiput, origin of the posterior neck muscles. Full range of motion flexion extension lateral rotation. Negative Spurling's. MSK/posterior shoulder/upper back: Significant tenderness palpation left greater than right insertion of the levator scapula. Also tender palpation rhomboids and trapezius.  EXTREMITY: Bilateral upper extremities full range of motion in almost strength in all planes the rotator cuff.  ASSESSMENT & PLAN:  See problem based charting & AVS for pt instructions.

## 2016-08-03 ENCOUNTER — Other Ambulatory Visit: Payer: Self-pay | Admitting: Internal Medicine

## 2016-08-04 MED ORDER — LEVOTHYROXINE SODIUM 50 MCG PO TABS: 50.0000 ug | ORAL_TABLET | Freq: Every day | ORAL | 0 refills | Status: DC

## 2016-08-05 ENCOUNTER — Other Ambulatory Visit: Payer: Self-pay | Admitting: Internal Medicine

## 2016-08-11 ENCOUNTER — Ambulatory Visit: Payer: Commercial Managed Care - PPO | Admitting: Internal Medicine

## 2016-08-11 ENCOUNTER — Encounter: Payer: Self-pay | Admitting: Internal Medicine

## 2016-08-11 ENCOUNTER — Ambulatory Visit (INDEPENDENT_AMBULATORY_CARE_PROVIDER_SITE_OTHER): Payer: Commercial Managed Care - PPO | Admitting: Internal Medicine

## 2016-08-11 VITALS — BP 118/68 | HR 72 | Temp 98.0°F | Resp 14 | Ht <= 58 in | Wt 142.1 lb

## 2016-08-11 DIAGNOSIS — R7303 Prediabetes: Secondary | ICD-10-CM | POA: Diagnosis not present

## 2016-08-11 DIAGNOSIS — F419 Anxiety disorder, unspecified: Secondary | ICD-10-CM

## 2016-08-11 DIAGNOSIS — Z23 Encounter for immunization: Secondary | ICD-10-CM | POA: Diagnosis not present

## 2016-08-11 DIAGNOSIS — G4700 Insomnia, unspecified: Secondary | ICD-10-CM

## 2016-08-11 DIAGNOSIS — E039 Hypothyroidism, unspecified: Secondary | ICD-10-CM

## 2016-08-11 DIAGNOSIS — F329 Major depressive disorder, single episode, unspecified: Secondary | ICD-10-CM

## 2016-08-11 LAB — TSH: TSH: 1.93 u[IU]/mL (ref 0.35–4.50)

## 2016-08-11 LAB — HEMOGLOBIN A1C: HEMOGLOBIN A1C: 5.5 % (ref 4.6–6.5)

## 2016-08-11 MED ORDER — NORTRIPTYLINE HCL 10 MG PO CAPS: 10.0000 mg | ORAL_CAPSULE | Freq: Every day | ORAL | 1 refills | Status: DC

## 2016-08-11 NOTE — Patient Instructions (Signed)
GO TO THE LAB : Get the blood work     GO TO THE FRONT DESK Schedule your next appointment for a  physical exam by an 12-2016  Stop alprazolam  Start nortriptyline  HEALTHY SLEEP Sleep hygiene: Basic rules for a good night's sleep  Sleep only as much as you need to feel rested and then get out of bed  Keep a regular sleep schedule  Avoid forcing sleep  Exercise regularly for at least 20 minutes, preferably 4 to 5 hours before bedtime  Avoid caffeinated beverages after lunch  Avoid alcohol near bedtime: no "night cap"  Avoid smoking, especially in the evening  Do not go to bed hungry  Adjust bedroom environment  Avoid prolonged use of light-emitting screens before bedtime   Deal with your worries before bedtime

## 2016-08-11 NOTE — Progress Notes (Signed)
Subjective:    Patient ID: Jamie Patrick, female    DOB: [birth year], 61 y.o.   MRN: [id number]  DOS:  08/11/2016 Type of visit - description : f/u Interval history: Anxiety depression: Currently well controlled. Insomnia: Xanax "stopped working". Has gained weight, admits to being inactive, unable to exercise for the last few months because she is back working.   Review of Systems   Past Medical History:  Diagnosis Date  . Allergy   . Anal fissure   . Anxiety   . Colon polyps    BENIGN  . CTS (carpal tunnel syndrome)    question of  . Depression   . Dysplasia of cervix, low grade (CIN 1)    h/o CIN-1/CIN2  . Hypothyroidism   . Osteopenia    as reported by pt, DEXA @ gyn  . Prediabetes 09/01/2012  . Vitamin D deficiency     Past Surgical History:  Procedure Laterality Date  . APPENDECTOMY    . CHOLECYSTECTOMY  2010  . CRYOTHERAPY     FOR CERVICAL DYSPLASIA CIN-1/CIN-2  . TONSILLECTOMY    . uterine polyps  2003   Dr.Fernadez/ RESECTOSCOPIC POLYPECTOMY    Social History   Social History  . Marital status: Married    Spouse name: N/A  . Number of children: 0  . Years of education: N/A   Occupational History  . working @ [employer]   Social History Main Topics  . Smoking status: Never Smoker  . Smokeless tobacco: Never Used  . Alcohol use No     Comment:    . Drug use: No  . Sexual activity: No   Other Topics Concern  . Not on file   Social History Narrative   Original from [place]-        Allergies as of 08/11/2016      Reactions   Ceftriaxone Sodium    REACTION: nausea   Cephalosporins Nausea And Vomiting   Sulfonamide Derivatives    REACTION: near syncope, N\T\V      Medication List       Accurate as of 08/11/16 11:59 PM. Always use your most recent med list.          bifidobacterium infantis capsule Take 1 capsule by mouth daily.   CALCIUM + D PO Take 1 tablet by mouth daily.   escitalopram 20 MG tablet Commonly known  as:  LEXAPRO Take 1 tablet (20 mg total) by mouth daily.   fish oil-omega-3 fatty acids 1000 MG capsule Take 2 g by mouth daily.   ipratropium 0.06 % nasal spray Commonly known as:  ATROVENT Place 2 sprays into both nostrils 4 (four) times daily.   levothyroxine 50 MCG tablet Commonly known as:  SYNTHROID, LEVOTHROID Take 1 tablet (50 mcg total) by mouth daily before breakfast.   MAGNESIUM PO Take 275 mg by mouth.   nortriptyline 10 MG capsule Commonly known as:  PAMELOR Take 1-2 capsules (10-20 mg total) by mouth at bedtime.   polyethylene glycol packet Commonly known as:  MIRALAX / GLYCOLAX Take 17 g by mouth as needed. Reported on 06/02/2015          Objective:   Physical Exam BP 118/68 (BP Location: Left Arm, Patient Position: Sitting, Cuff Size: Normal)   Pulse 72   Temp 98 F (36.7 C) (Oral)   Resp 14   Ht 4\' 10"  (1.473 m)   Wt 142 lb 2 oz (64.5 kg)   LMP 02/03/2009  SpO2 98%   BMI 29.70 kg/m  General:   Well developed, well nourished . NAD.  HEENT:  Normocephalic . Face symmetric, atraumatic Lungs:  CTA B Normal respiratory effort, no intercostal retractions, no accessory muscle use. Heart: RRR,  no murmur.  No pretibial edema bilaterally  Skin: Not pale. Not jaundice Neurologic:  alert & oriented X3.  Speech normal, gait appropriate for age and unassisted Psych--  Cognition and judgment appear intact.  Cooperative with normal attention span and concentration.  Behavior appropriate. No anxious or depressed appearing.      Assessment & Plan:   Assessment  Prediabetes Hypothyroidism Anxiety , depression , insomnia Intolerant to Abilify,Viibrid, used to see Dr Toy Care trazadone (didn't work), elavil (stopped working), Microbiologist (++s/e) Currently on xanax -lexapro Allergic rhinitis Osteopenia -- per gyn, last DEXA 07-2015 Vitamin D deficiency: On oral supplements Menopausal  h/o dysplasia of the cervix CN1/CN2 Rosacea-- sees derm      PLAN: Prediabetes: Concern about the issue, we took about diet, exercise. Recommend calorie counting. Check A1c Hypothyroidism: Good compliance of medication, check labs Anxiety depression: On Lexapro, seems to be doing well. She is back working full-time. Insomnia: states Xanax is not working anymore. Previously tried trazodone, Elavil and Ambien. Plan: good sleep habits,  rx a different tricyclic antidepressant ---> will try nortriptyline. RTC 4 months, CPX

## 2016-08-11 NOTE — Progress Notes (Signed)
Pre visit review using our clinic review tool, if applicable. No additional management support is needed unless otherwise documented below in the visit note. 

## 2016-08-12 NOTE — Assessment & Plan Note (Signed)
Prediabetes: Concern about the issue, we took about diet, exercise. Recommend calorie counting. Check A1c Hypothyroidism: Good compliance of medication, check labs Anxiety depression: On Lexapro, seems to be doing well. She is back working full-time. Insomnia: states Xanax is not working anymore. Previously tried trazodone, Elavil and Ambien. Plan: good sleep habits,  rx a different tricyclic antidepressant ---> will try nortriptyline. RTC 4 months, CPX

## 2016-08-13 MED ORDER — LEVOTHYROXINE SODIUM 50 MCG PO TABS: 50.0000 ug | ORAL_TABLET | Freq: Every day | ORAL | 1 refills | Status: DC

## 2016-08-13 NOTE — Addendum Note (Signed)
Addended byDamita Dunnings D on: 08/13/2016 07:41 AM   Modules accepted: Orders

## 2016-08-14 ENCOUNTER — Encounter: Payer: Self-pay | Admitting: Internal Medicine

## 2016-08-14 ENCOUNTER — Other Ambulatory Visit: Payer: Self-pay | Admitting: Internal Medicine

## 2016-08-14 MED ORDER — ESCITALOPRAM OXALATE 20 MG PO TABS: 20.0000 mg | ORAL_TABLET | Freq: Every day | ORAL | 2 refills | Status: DC

## 2016-08-15 ENCOUNTER — Ambulatory Visit: Payer: 59 | Admitting: Internal Medicine

## 2016-08-20 ENCOUNTER — Encounter: Payer: Self-pay | Admitting: Gynecology

## 2016-09-03 ENCOUNTER — Encounter: Payer: Self-pay | Admitting: Internal Medicine

## 2016-09-03 DIAGNOSIS — G4700 Insomnia, unspecified: Secondary | ICD-10-CM

## 2016-09-04 NOTE — Telephone Encounter (Signed)
Enter a neurology referral, DX insomnia. See patient's message  Received: Today  Message Contents  Colon Branch, MD  Damita Dunnings, [place]

## 2016-09-04 NOTE — Telephone Encounter (Signed)
Referral placed.

## 2016-09-11 ENCOUNTER — Encounter: Payer: Self-pay | Admitting: Women's Health

## 2016-09-25 ENCOUNTER — Encounter: Payer: Self-pay | Admitting: Family Medicine

## 2016-11-24 ENCOUNTER — Telehealth: Payer: Self-pay | Admitting: *Deleted

## 2016-11-24 NOTE — Telephone Encounter (Signed)
Received request for Medical Records from [facility]; forwarded to [place] for email/scana/SLS 08/20

## 2016-12-01 ENCOUNTER — Encounter: Payer: Self-pay | Admitting: Internal Medicine

## 2016-12-09 ENCOUNTER — Ambulatory Visit: Payer: Commercial Managed Care - PPO | Admitting: Internal Medicine

## 2016-12-09 ENCOUNTER — Encounter: Payer: Self-pay | Admitting: Family Medicine

## 2016-12-09 ENCOUNTER — Ambulatory Visit (INDEPENDENT_AMBULATORY_CARE_PROVIDER_SITE_OTHER): Payer: PRIVATE HEALTH INSURANCE | Admitting: Family Medicine

## 2016-12-09 DIAGNOSIS — M6530 Trigger finger, unspecified finger: Secondary | ICD-10-CM

## 2016-12-09 DIAGNOSIS — M1811 Unilateral primary osteoarthritis of first carpometacarpal joint, right hand: Secondary | ICD-10-CM | POA: Diagnosis not present

## 2016-12-09 DIAGNOSIS — M65332 Trigger finger, left middle finger: Secondary | ICD-10-CM

## 2016-12-09 MED ORDER — METHYLPREDNISOLONE ACETATE 40 MG/ML IJ SUSP
40.0000 mg | Freq: Once | INTRAMUSCULAR | Status: AC
  Administered 2016-12-09: 40 mg via INTRA_ARTICULAR

## 2016-12-11 ENCOUNTER — Encounter: Payer: Self-pay | Admitting: Family Medicine

## 2016-12-12 ENCOUNTER — Encounter: Payer: Self-pay | Admitting: Family Medicine

## 2016-12-12 NOTE — Progress Notes (Signed)
    CHIEF COMPLAINT / HPI:  Right thumb pain and left long finger trigger finger Thumb pain started after working several overtime shifts. 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 Pain is with any movement, sone swelling. OTC meds not helping. Went to UC and they gave her some NSAID and a brace which have not helped. Using ice someimes. Right hand dominant. Interfering w her work  Left long finger has had trigger issues before and symptoms of sticking (in extension) are returning. Wants CSI as that helped a lot last time.  REVIEW OF SYSTEMS:  Pertinent review of systems: negative for fever or unusual weight change. PERTINENT  PMH / PSH: I have reviewed the patient's medications, allergies, past medical and surgical history, smoking status.  Pertinent findings that relate to today's visit / issues include: No personal hx DM Has known hx severe carpal tunnel Non smoke  OBJECTIVE:  Vital signs are reviewed.   LEFT long finger sticks in extension with palpable nodule at A1 pulley. RIGHT thumb ttp CMP. FROM. Normal strength.  SKIN no erythema or lesions. Mild swelling. [place] thumb shows some synovial hypertrophy, osteophytes.  INJECTION: Patient was given informed consent, signed copy in the chart. Appropriate time out was taken. Area prepped and draped in usual sterile fashion. 1/12 cc of methylprednisolone 40 mg/ml plus  1 cc of 1% lidocaine without epinephrine was injected into the right MCP joint using a(n) medial approach. The patient tolerated the procedure well. There were no complications. Post procedure instructions were given. INJECTION: Patient was given informed consent, signed copy in the chart. Appropriate time out was taken. Area prepped and draped in usual sterile fashion. 1/2 cc of methylprednisolone 40 mg/ml plus  1/2 cc of 1% lidocaine without epinephrine was injected into the A1 pulley of the right long fingerusing a(n)  volar approach. The patient tolerated the procedure well. There were no complications. Post procedure instructions were given.    ASSESSMENT / PLAN: Please see problem oriented charting for details

## 2016-12-22 ENCOUNTER — Encounter: Payer: Self-pay | Admitting: Internal Medicine

## 2016-12-22 ENCOUNTER — Ambulatory Visit (INDEPENDENT_AMBULATORY_CARE_PROVIDER_SITE_OTHER): Payer: PRIVATE HEALTH INSURANCE | Admitting: Internal Medicine

## 2016-12-22 VITALS — BP 116/74 | HR 76 | Temp 98.0°F | Resp 14 | Ht <= 58 in | Wt 140.1 lb

## 2016-12-22 DIAGNOSIS — Z0000 Encounter for general adult medical examination without abnormal findings: Secondary | ICD-10-CM

## 2016-12-22 LAB — COMPREHENSIVE METABOLIC PANEL
ALK PHOS: 65 U/L (ref 39–117)
ALT: 17 U/L (ref 0–35)
AST: 20 U/L (ref 0–37)
Albumin: 4.4 g/dL (ref 3.5–5.2)
BILIRUBIN TOTAL: 0.8 mg/dL (ref 0.2–1.2)
BUN: 16 mg/dL (ref 6–23)
CALCIUM: 9.8 mg/dL (ref 8.4–10.5)
CO2: 27 meq/L (ref 19–32)
CREATININE: 0.62 mg/dL (ref 0.40–1.20)
Chloride: 106 mEq/L (ref 96–112)
GFR: 104.32 mL/min (ref >60.00)
Glucose, Bld: 100 mg/dL — ABNORMAL HIGH (ref 70–99)
Potassium: 4.6 mEq/L (ref 3.5–5.1)
Sodium: 140 mEq/L (ref 135–145)
TOTAL PROTEIN: 7.6 g/dL (ref 6.0–8.3)

## 2016-12-22 LAB — CBC WITH DIFFERENTIAL/PLATELET
BASOS ABS: 0.1 10*3/uL (ref 0.0–0.1)
Basophils Relative: 1.2 % (ref 0.0–3.0)
EOS PCT: 4.1 % (ref 0.0–5.0)
Eosinophils Absolute: 0.2 10*3/uL (ref 0.0–0.7)
HCT: 38.3 % (ref 36.0–46.0)
Hemoglobin: 12.8 g/dL (ref 12.0–15.0)
Lymphocytes Relative: 44.8 % (ref 12.0–46.0)
Lymphs Abs: 2.2 10*3/uL (ref 0.7–4.0)
MCHC: 33.3 g/dL (ref 30.0–36.0)
MCV: 96.2 fl (ref 78.0–100.0)
MONOS PCT: 5.2 % (ref 3.0–12.0)
Monocytes Absolute: 0.3 10*3/uL (ref 0.1–1.0)
NEUTROS ABS: 2.2 10*3/uL (ref 1.4–7.7)
Neutrophils Relative %: 44.7 % (ref 43.0–77.0)
Platelets: 218 10*3/uL (ref 150.0–400.0)
RBC: 3.98 Mil/uL (ref 3.87–5.11)
RDW: 14 % (ref 11.5–15.5)
WBC: 4.9 10*3/uL (ref 4.0–10.5)

## 2016-12-22 LAB — LIPID PANEL
CHOL/HDL RATIO: 3
Cholesterol: 203 mg/dL — ABNORMAL HIGH (ref 0–200)
HDL: 65.3 mg/dL (ref >39.00)
LDL Cholesterol: 117 mg/dL — ABNORMAL HIGH (ref 0–99)
NONHDL: 138.18
TRIGLYCERIDES: 105 mg/dL (ref 0.0–149.0)
VLDL: 21 mg/dL (ref 0.0–40.0)

## 2016-12-22 LAB — FOLATE: Folate: 23.2 ng/mL (ref >5.9)

## 2016-12-22 LAB — VITAMIN B12: Vitamin B-12: 572 pg/mL (ref 211–911)

## 2016-12-22 MED ORDER — LEVOTHYROXINE SODIUM 50 MCG PO TABS: 50.0000 ug | ORAL_TABLET | Freq: Every day | ORAL | 2 refills | Status: DC

## 2016-12-22 MED ORDER — ESCITALOPRAM OXALATE 20 MG PO TABS: 20.0000 mg | ORAL_TABLET | Freq: Every day | ORAL | 2 refills | Status: DC

## 2016-12-22 MED ORDER — ZOSTER VAC RECOMB ADJUVANTED 50 MCG/0.5ML IM SUSR: 0.5000 mL | Freq: Once | INTRAMUSCULAR | 1 refills | Status: AC

## 2016-12-22 NOTE — Progress Notes (Signed)
Subjective:    Patient ID: Jamie Patrick, female    DOB: 09-02-1955, 61 y.o.   MRN: [id number]  DOS:  12/22/2016 Type of visit - description : cpx Interval history: Here with her husband, has some concerns, most of them are not new  Wt Readings from Last 3 Encounters:  12/22/16 140 lb 2 oz (63.6 kg)  12/09/16 140 lb (63.5 kg)  08/11/16 142 lb 2 oz (64.5 kg)     Review of Systems Having occasional knee and hip pain. Also carpal tunnel syndrome symptoms. Reports her job is very stressful however anxiety is well controlled on Lexapro. Still having issues with difficulty sleeping, nortriptyline did not help, taking OTC NyQuil sometimes w/ good results. Occasionally sees red blood per rectum when she wipes, has some itching and burning at the area. No vaginal bleeding. History of hemorrhoids. Feels tired at the end of the day. Denies lower extremity edema, chest pain, difficulty breathing.  Other than above, a 14 point review of systems is negative     Past Medical History:  Diagnosis Date  . Allergy   . Anal fissure   . Anxiety   . Colon polyps    BENIGN  . CTS (carpal tunnel syndrome)    question of  . Depression   . Dysplasia of cervix, low grade (CIN 1)    h/o CIN-1/CIN2  . Hypothyroidism   . Osteopenia    as reported by pt, DEXA @ gyn  . Prediabetes 09/01/2012  . Vitamin D deficiency     Past Surgical History:  Procedure Laterality Date  . APPENDECTOMY    . CHOLECYSTECTOMY  2010  . CRYOTHERAPY     FOR CERVICAL DYSPLASIA CIN-1/CIN-2  . TONSILLECTOMY    . uterine polyps  2003   Dr.Fernadez/ RESECTOSCOPIC POLYPECTOMY    Social History   Social History  . Marital status: Married    Spouse name: N/A  . Number of children: 0  . Years of education: N/A   Occupational History  . working @ [employer]    Social History Main Topics  . Smoking status: Never Smoker  . Smokeless tobacco: Never Used  . Alcohol use No     Comment:    . Drug use: No  . Sexual  activity: No   Other Topics Concern  . Not on file   Social History Narrative   Original from [place]-       Family History  Problem Relation Age of Onset  . Diabetes Brother        uncles, GM  . Hypertension Brother   . Breast cancer Other        distant cousins  . Osteoporosis Sister        2 sisters   . Heart attack Neg Hx   . Colon cancer Neg Hx      Allergies as of 12/22/2016      Reactions   Ceftriaxone Sodium    REACTION: nausea   Cephalosporins Nausea And Vomiting   Sulfonamide Derivatives    REACTION: near syncope, N\T\V      Medication List       Accurate as of 12/22/16  2:34 PM. Always use your most recent med list.          CALCIUM + D PO Take 1 tablet by mouth daily.   escitalopram 20 MG tablet Commonly known as:  LEXAPRO Take 1 tablet (20 mg total) by mouth daily.   fish oil-omega-3 fatty acids  1000 MG capsule Take 2 g by mouth daily.   ipratropium 0.06 % nasal spray Commonly known as:  ATROVENT Place 2 sprays into both nostrils 4 (four) times daily.   levothyroxine 50 MCG tablet Commonly known as:  SYNTHROID, LEVOTHROID Take 1 tablet (50 mcg total) by mouth daily before breakfast.   MAGNESIUM PO Take 275 mg by mouth.   polyethylene glycol packet Commonly known as:  MIRALAX / GLYCOLAX Take 17 g by mouth as needed. Reported on 06/02/2015   Zoster Vac Recomb Adjuvanted injection Commonly known as:  SHINGRIX Inject 0.5 mLs into the muscle once.            Discharge Care Instructions        Start     Ordered   12/22/16 0000  Comp Met (CMET)     12/22/16 0943   12/22/16 0000  Lipid panel     12/22/16 0943   12/22/16 0000  CBC w/Diff     12/22/16 0943   12/22/16 0000  Vitamin D 1,25 dihydroxy     12/22/16 0943   12/22/16 0000  B12     12/22/16 0943   12/22/16 0000  Folate     12/22/16 0943   12/22/16 0000  Zoster Vac Recomb Adjuvanted [facility]) injection   Once     12/22/16 0943   12/22/16 0000  escitalopram (LEXAPRO) 20  MG tablet  Daily     12/22/16 0948   12/22/16 0000  levothyroxine (SYNTHROID, LEVOTHROID) 50 MCG tablet  Daily before breakfast     12/22/16 0948         Objective:   Physical Exam BP 116/74 (BP Location: Left Arm, Patient Position: Sitting, Cuff Size: Normal)   Pulse 76   Temp 98 F (36.7 C) (Oral)   Resp 14   Ht '4\' 10"'  (1.473 m)   Wt 140 lb 2 oz (63.6 kg)   LMP 02/03/2009   SpO2 97%   BMI 29.29 kg/m   General:   Well developed, well nourished . NAD.  Neck: No  thyromegaly  HEENT:  Normocephalic . Face symmetric, atraumatic Lungs:  CTA B Normal respiratory effort, no intercostal retractions, no accessory muscle use. Heart: RRR,  no murmur.  No pretibial edema bilaterally  Abdomen:  Not distended, soft, non-tender. No rebound or rigidity.   Skin: Exposed areas without rash. Not pale. Not jaundice Neurologic:  alert & oriented X3.  Speech normal, gait appropriate for age and unassisted Strength symmetric and appropriate for age.  Psych: Cognition and judgment appear intact.  Cooperative with normal attention span and concentration.  Behavior appropriate. No anxious or depressed appearing.    Assessment & Plan:   Assessment  Prediabetes Hypothyroidism Anxiety , depression , insomnia Intolerant to Abilify,Viibrid, used to see Dr Toy Care trazadone (didn't work), elavil (stopped working), Microbiologist (++s/e). Pamelor: no help Allergic rhinitis Osteopenia -- per gyn, last DEXA 07-2015 Vitamin D deficiency: On oral supplements Menopausal  h/o dysplasia of the cervix CN1/CN2 Rosacea-- sees derm     PLAN: Prediabetes, last A1c satisfactory Hypothyroidism: Last TSH satisfactory, on Synthroid Anxiety depression and insomnia: Well controlled except for still having occasional difficulty sleeping, nortriptyline did not help, takes NyQuil as needed with good results. I told the patient that eventually psychotherapy would be a most useful intervention. CTS and MSK sxs,  recommend to discuss with sports medicine Internal hemorrhoids: noted @ colonoscopy, occasionally has red blood when she wipes, recommend OTCs as needed. Mild obesity: BMI 29, medications?  Don't recommend medicines at this point rather focus on diet and exercise which were discussed today. Lack of  energy: Checking labs including B12, vitamin D and folic acid. Does not snore. RTC 6 months

## 2016-12-22 NOTE — Patient Instructions (Signed)
GO TO THE LAB : Get the blood work     GO TO THE FRONT DESK Schedule your next appointment for a  routine checkup in 6 months  

## 2016-12-22 NOTE — Assessment & Plan Note (Addendum)
-  Td 2018  ; zostavax: pt's husband immunosuppressed thus unable to take but shingrix was discussed ; flu shot at work -Female care per gyn Elon Alas) MMG 2017, PAPs per gyn Cscope Dr Collene Mares, aprox 2006 and again 04-2010; Cscope 05-2015, Dr Henrene Pastor, had a  polyp, 5 years   -diet, exercise: Counseled Labs: CMP, FLP, CBC, Z61, vitamin D, folic acid

## 2016-12-22 NOTE — Assessment & Plan Note (Signed)
Prediabetes, last A1c satisfactory Hypothyroidism: Last TSH satisfactory, on Synthroid Anxiety depression and insomnia: Well controlled except for still having occasional difficulty sleeping, nortriptyline did not help, takes NyQuil as needed with good results. I told the patient that eventually psychotherapy would be a most useful intervention. CTS and MSK sxs, recommend to discuss with sports medicine Internal hemorrhoids: noted @ colonoscopy, occasionally has red blood when she wipes, recommend OTCs as needed. Mild obesity: BMI 29, medications? Don't recommend medicines at this point rather focus on diet and exercise which were discussed today. Lack of  energy: Checking labs including B12, vitamin D and folic acid. Does not snore. RTC 6 months

## 2016-12-22 NOTE — Progress Notes (Signed)
Pre visit review using our clinic review tool, if applicable. No additional management support is needed unless otherwise documented below in the visit note. 

## 2016-12-23 ENCOUNTER — Encounter: Payer: Self-pay | Admitting: Family Medicine

## 2016-12-24 ENCOUNTER — Encounter: Payer: Commercial Managed Care - PPO | Admitting: Internal Medicine

## 2016-12-25 ENCOUNTER — Encounter: Payer: Self-pay | Admitting: Internal Medicine

## 2016-12-25 LAB — VITAMIN D 1,25 DIHYDROXY
Vitamin D 1, 25 (OH)2 Total: 58 pg/mL (ref 18–72)
Vitamin D3 1, 25 (OH)2: 58 pg/mL

## 2016-12-26 ENCOUNTER — Encounter: Payer: Self-pay | Admitting: Internal Medicine

## 2016-12-26 ENCOUNTER — Telehealth: Payer: Self-pay | Admitting: Family Medicine

## 2016-12-26 NOTE — Telephone Encounter (Signed)
Please let her know I have filled out paperwork for her restrictions as per the letter. I have given restrictions until or through 02/05/2017 so she needs to see me in end of October. THANKS! Dorcas Mcmurray

## 2016-12-29 NOTE — Telephone Encounter (Signed)
Message left on VM regarding letter

## 2017-01-09 ENCOUNTER — Encounter: Payer: Self-pay | Admitting: Family Medicine

## 2017-01-12 ENCOUNTER — Other Ambulatory Visit: Payer: Self-pay | Admitting: Family Medicine

## 2017-01-12 MED ORDER — NAPROXEN SODIUM 220 MG PO TABS: 220.0000 mg | ORAL_TABLET | Freq: Two times a day (BID) | ORAL | 5 refills | Status: DC | PRN

## 2017-01-30 ENCOUNTER — Ambulatory Visit (INDEPENDENT_AMBULATORY_CARE_PROVIDER_SITE_OTHER): Payer: PRIVATE HEALTH INSURANCE | Admitting: Family Medicine

## 2017-01-30 VITALS — BP 104/70 | Ht <= 58 in | Wt 140.0 lb

## 2017-01-30 DIAGNOSIS — M65311 Trigger thumb, right thumb: Secondary | ICD-10-CM

## 2017-01-30 DIAGNOSIS — M25561 Pain in right knee: Secondary | ICD-10-CM

## 2017-01-30 DIAGNOSIS — M25562 Pain in left knee: Secondary | ICD-10-CM | POA: Diagnosis not present

## 2017-01-30 MED ORDER — METHYLPREDNISOLONE ACETATE 40 MG/ML IJ SUSP
20.0000 mg | Freq: Once | INTRAMUSCULAR | Status: AC
  Administered 2017-01-30: 20 mg via INTRA_ARTICULAR

## 2017-01-30 NOTE — Assessment & Plan Note (Signed)
Patient is presenting with signs and symptoms consistent with trigger finger of her right thumb.  Patient states that she recently received a intra-articular CMC joint injection last month but her thumb pain seems to be worse.  It is now triggering when she wakes up in the morning.  She has been compliant with thumb spica brace. -Injection provided today, patient tolerated well. -Patient asked to stay in thumb spica splint for 3-5 days to allow appropriate rest for the affected thumb. -RICE therapy and oral NSAIDs (OTC) as needed -Follow-up with Dr. Nori Riis on 03/10/17

## 2017-01-30 NOTE — Assessment & Plan Note (Signed)
Patient is presenting with new complaints of bilateral knee pain.  No recent trauma or injury.  Pain appears to be patellar in nature.  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 -Encouraged quadricep strengthening exercises. -RICE therapy and oral NSAIDs as needed.  Next: Patient appears to be having significant multi-joint pain issues.  Would consider possible outside sources/influences as source of pain.  Consideration for initiation of SNRI in the future may be warranted and beneficial.

## 2017-01-30 NOTE — Progress Notes (Signed)
HPI  CC: Right thumb pain and bilateral knee pain Patient is presenting today for follow-up regarding her right thumb pain as well as some new complaints regarding bilateral knee pain.  Patient states that she now has slightly worsened right-sided thumb pain.  She states that she has been compliant with her thumb brace at home but is unable to work while wearing it also has not been doing so when working.  Pain is located along the dorsal aspect of the MCP as well as the palmar aspect of the entire length of the thumb.  She endorses some triggering.  Patient's bilateral knee pain is described as achiness.  She denies any trauma, injury, or fall.  No feelings of instability.  Pain is located along the inferior pole of the patella bilaterally.  She denies any significant swelling.  Pain is described as achiness with occasional sharpness.  Worse with stairs.  She denies any mechanical symptoms.  Endorses patellar crepitus.  Medications/Interventions Tried: CMC injection, rest  See HPI and/or previous note for associated ROS.  Objective: BP 104/70   Ht 4\' 10"  (1.473 m)   Wt 140 lb (63.5 kg)   LMP 02/03/2009   BMI 29.26 kg/m  Gen: NAD, well groomed, a/o x3, normal affect.  CV: Well-perfused. Warm.  Resp: Non-labored.  Neuro: Sensation intact throughout. No gross coordination deficits.  Gait: Nonpathologic posture, unremarkable stride without signs of limp or balance issues. Knee, bilateral: TTP noted at the inferior pole of the patella bilaterally. Inspection was negative for erythema, ecchymosis, and effusion. No obvious bony abnormalities or signs of osteophyte development. Palpation yielded no asymmetric warmth; Moderate medial joint line tenderness bilaterally; No condyle tenderness; +1 bilateral patellar crepitus. Patellar and quadriceps tendons unremarkable, and no tenderness of the pes anserine bursa. No obvious Baker's cyst development. ROM normal in flexion (135 degrees) and extension  (0 degrees). Normal hamstring and quadriceps strength. Neurovascularly intact bilaterally.  - Ligaments: (Solid and consistent endpoints)   - ACL (present bilaterally)   - PCL (present bilaterally)   - LCL (present bilaterally)   - MCL (present bilaterally).   - Patella:   - Patellar grind/compression: Positive   - Patellar glide: Without apprehension Thumb, Right: Inspection yielded no evidence of erythema, ecchymosis, or bony deformity.  Tenderness to palpation along the palmar aspect, specifically along the flexor tendons.  Small nodule noted just proximal to the first MCP joint.  Range of motion full in all directions.  No evidence of triggering.  Finkelstein's negative.   INJECTION: Right Thumb Trigger Finger -- ultrasound-guided Patient was given informed consent, signed copy in the chart. Appropriate time out was taken. 1st flexor tendon sheath was visualized under ultrasound and needle placement was marked. Area prepped and draped in usual sterile fashion. Needle placement confirmed under ultrasound prior to steroid injection. 0.5 cc of methylprednisolone 40 mg/ml plus 0.5 of 1% lidocaine without epinephrine was injected into the flexor tendon sheath near the A1 pulley using a(n) palmar approach. The patient tolerated the procedure well. There were no complications. Post procedure instructions were given.   Assessment and plan:  Trigger finger of right thumb Patient is presenting with signs and symptoms consistent with trigger finger of her right thumb.  Patient states that she recently received a intra-articular CMC joint injection last month but her thumb pain seems to be worse.  It is now triggering when she wakes up in the morning.  She has been compliant with thumb spica brace. -Injection provided today, patient  tolerated well. -Patient asked to stay in thumb spica splint for 3-5 days to allow appropriate rest for the affected thumb. -RICE therapy and oral NSAIDs (OTC) as  needed -Follow-up with Dr. Nori Riis on 03/10/17  Pain in both knees Patient is presenting with new complaints of bilateral knee pain.  No recent trauma or injury.  Pain appears to be patellar in nature.  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 -Encouraged quadricep strengthening exercises. -RICE therapy and oral NSAIDs as needed.  Next: Patient appears to be having significant multi-joint pain issues.  Would consider possible outside sources/influences as source of pain.  Consideration for initiation of SNRI in the future may be warranted and beneficial.   Meds ordered this encounter  Medications  . methylPREDNISolone acetate (DEPO-MEDROL) injection 20 mg     Jamie Leatherwood, MD,MS [facility] 01/30/2017 4:58 PM

## 2017-01-30 NOTE — Progress Notes (Signed)
SM[facility]: Attending Note: I have reviewed the chart, discussed wit the Sports Medicine Fellow. I agree with assessment and treatment plan as detailed in the Fellow's note.  

## 2017-02-17 ENCOUNTER — Encounter: Payer: Self-pay | Admitting: Internal Medicine

## 2017-03-14 ENCOUNTER — Encounter (HOSPITAL_COMMUNITY): Payer: Self-pay | Admitting: Emergency Medicine

## 2017-03-14 ENCOUNTER — Other Ambulatory Visit: Payer: Self-pay

## 2017-03-14 ENCOUNTER — Emergency Department (HOSPITAL_COMMUNITY): Payer: No Typology Code available for payment source

## 2017-03-14 ENCOUNTER — Emergency Department (HOSPITAL_COMMUNITY)
Admission: EM | Admit: 2017-03-14 | Discharge: 2017-03-14 | Disposition: A | Discharge status: Home / Self Care | Payer: No Typology Code available for payment source | Attending: Emergency Medicine | Admitting: Emergency Medicine

## 2017-03-14 DIAGNOSIS — Z79899 Other long term (current) drug therapy: Secondary | ICD-10-CM | POA: Insufficient documentation

## 2017-03-14 DIAGNOSIS — M542 Cervicalgia: Secondary | ICD-10-CM | POA: Diagnosis present

## 2017-03-14 DIAGNOSIS — E039 Hypothyroidism, unspecified: Secondary | ICD-10-CM | POA: Diagnosis not present

## 2017-03-14 MED ORDER — FENTANYL CITRATE (PF) 100 MCG/2ML IJ SOLN
50.0000 ug | Freq: Once | INTRAMUSCULAR | Status: AC
  Administered 2017-03-14: 50 ug via INTRAMUSCULAR
  Filled 2017-03-14: qty 2

## 2017-03-14 MED ORDER — CYCLOBENZAPRINE HCL 10 MG PO TABS
5.0000 mg | ORAL_TABLET | Freq: Once | ORAL | Status: AC
  Administered 2017-03-14: 5 mg via ORAL
  Filled 2017-03-14: qty 1

## 2017-03-14 MED ORDER — OXYCODONE-ACETAMINOPHEN 5-325 MG PO TABS: 1.0000 | ORAL_TABLET | Freq: Once | ORAL | Status: DC

## 2017-03-14 MED ORDER — CYCLOBENZAPRINE HCL 10 MG PO TABS: 10.0000 mg | ORAL_TABLET | Freq: Two times a day (BID) | ORAL | 0 refills | Status: DC | PRN

## 2017-03-14 NOTE — Discharge Instructions (Signed)
Please read instructions below. Talk with your PCP about any new medications.  You can take advil/ibuprofen every 6 hours as needed for pain.  You can take flexeril at bedtime as needed for muscle spasm. Apply ice to your neck for 20 minutes at a time. Return to ER if new numbness or tingling in your arms or legs, inability to urinate, inability to hold your bowels, or weakness in your extremities.
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
## 2017-03-15 ENCOUNTER — Encounter: Payer: Self-pay | Admitting: Family Medicine

## 2017-03-17 ENCOUNTER — Ambulatory Visit: Payer: PRIVATE HEALTH INSURANCE | Admitting: Family Medicine

## 2017-04-28 ENCOUNTER — Encounter: Payer: Self-pay | Admitting: Sports Medicine

## 2017-04-28 ENCOUNTER — Ambulatory Visit: Payer: PRIVATE HEALTH INSURANCE | Admitting: Sports Medicine

## 2017-04-28 VITALS — BP 140/78 | Ht <= 58 in | Wt 140.0 lb

## 2017-04-28 DIAGNOSIS — G5603 Carpal tunnel syndrome, bilateral upper limbs: Secondary | ICD-10-CM

## 2017-04-28 DIAGNOSIS — M542 Cervicalgia: Secondary | ICD-10-CM

## 2017-04-28 MED ORDER — METHYLPREDNISOLONE ACETATE 80 MG/ML IJ SUSP
80.0000 mg | Freq: Once | INTRAMUSCULAR | Status: AC
  Administered 2017-04-28: 80 mg via INTRAMUSCULAR
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
## 2017-04-28 NOTE — Patient Instructions (Addendum)
Dr. Alfredia Client [address] [place], [place] [address] Phone: [phone]  Langley Gauss will call you with an appt time and date. Will fax notes to her at [phone]

## 2017-05-18 ENCOUNTER — Ambulatory Visit: Payer: PRIVATE HEALTH INSURANCE | Admitting: Sports Medicine

## 2017-05-18 ENCOUNTER — Encounter: Payer: Self-pay | Admitting: Sports Medicine

## 2017-05-18 VITALS — BP 118/74 | Ht <= 58 in | Wt 140.0 lb

## 2017-05-18 DIAGNOSIS — G5603 Carpal tunnel syndrome, bilateral upper limbs: Secondary | ICD-10-CM

## 2017-05-18 MED ORDER — PREDNISONE 10 MG PO TABS: ORAL_TABLET | ORAL | 0 refills | Status: DC

## 2017-05-18 NOTE — Progress Notes (Addendum)
   Subjective:    Patient ID: Jamie Patrick, female    DOB: 1956/03/02, 62 y.o.   MRN: [id number]  HPI   Patient comes in today requesting cortisone injections into each wrist. She has a well-documented history of severe bilateral carpal tunnel syndrome. She met with Dr. Len Childs and has surgery scheduled for April. He put her on diclofenac but it has not been helpful. She is also still struggling with diffuse neck pain. She is getting some numbness and tingling in her hands but nowhere else in her upper extremities.   Review of Systems    as above Objective:   Physical Exam  Well-developed, well-nourished. No acute distress  Cervical spine: Good range of motion but she does have pain at the extremes of cervical rotation.  Wrists: Mild swelling diffusely. Positive Tinel's bilaterally, mild positive Phalen's. Mild APB atrophy noted on the right. Sensation appears to be intact to light-touch.      Assessment & Plan:   Severe bilateral carpal tunnel syndrome Cervical degenerative disc disease  Explained to her that I do not think carpal tunnel injections will offer her much benefit. The severity of her carpal tunnel syndrome warrants surgical release. I will try a 6 day Sterapred Dosepak to see if that will help her wrists and her neck. She understands that this may be temporary. She will stop her diclofenac while on prednisone. She has been wearing her night splints at night. She understands that definitive treatment is carpal tunnel release. Follow-up as needed.

## 2017-05-19 ENCOUNTER — Other Ambulatory Visit: Payer: Self-pay

## 2017-05-19 MED ORDER — PREDNISONE 10 MG PO TABS: ORAL_TABLET | ORAL | 0 refills | Status: DC

## 2017-05-28 ENCOUNTER — Encounter: Payer: Self-pay | Admitting: Sports Medicine

## 2017-09-11 HISTORY — PX: CARPAL TUNNEL RELEASE: SHX101

## 2017-12-03 ENCOUNTER — Other Ambulatory Visit: Payer: Self-pay | Admitting: Internal Medicine

## 2017-12-08 LAB — HM DEXA SCAN

## 2018-06-23 DIAGNOSIS — N3946 Mixed incontinence: Secondary | ICD-10-CM | POA: Insufficient documentation
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
## 2021-09-13 LAB — HM COLONOSCOPY

## 2022-02-20 LAB — HM MAMMOGRAPHY

## 2022-10-23 ENCOUNTER — Encounter: Payer: Self-pay | Admitting: Internal Medicine

## 2022-10-23 DIAGNOSIS — S92353A Displaced fracture of fifth metatarsal bone, unspecified foot, initial encounter for closed fracture: Secondary | ICD-10-CM | POA: Insufficient documentation

## 2022-10-23 DIAGNOSIS — S5290XA Unspecified fracture of unspecified forearm, initial encounter for closed fracture: Secondary | ICD-10-CM | POA: Insufficient documentation

## 2022-10-23 DIAGNOSIS — D649 Anemia, unspecified: Secondary | ICD-10-CM | POA: Insufficient documentation

## 2022-10-23 DIAGNOSIS — Z8619 Personal history of other infectious and parasitic diseases: Secondary | ICD-10-CM | POA: Insufficient documentation

## 2022-10-24 ENCOUNTER — Ambulatory Visit (INDEPENDENT_AMBULATORY_CARE_PROVIDER_SITE_OTHER): Payer: Medicare HMO | Admitting: Internal Medicine

## 2022-10-24 ENCOUNTER — Encounter: Payer: Self-pay | Admitting: Internal Medicine

## 2022-10-24 VITALS — BP 118/62 | HR 85 | Temp 98.1°F | Resp 18 | Ht <= 58 in | Wt 142.4 lb

## 2022-10-24 DIAGNOSIS — G4700 Insomnia, unspecified: Secondary | ICD-10-CM

## 2022-10-24 DIAGNOSIS — E039 Hypothyroidism, unspecified: Secondary | ICD-10-CM | POA: Diagnosis not present

## 2022-10-24 DIAGNOSIS — M8588 Other specified disorders of bone density and structure, other site: Secondary | ICD-10-CM

## 2022-10-24 DIAGNOSIS — F32A Depression, unspecified: Secondary | ICD-10-CM

## 2022-10-24 DIAGNOSIS — F419 Anxiety disorder, unspecified: Secondary | ICD-10-CM

## 2022-10-24 DIAGNOSIS — R739 Hyperglycemia, unspecified: Secondary | ICD-10-CM

## 2022-10-24 NOTE — Patient Instructions (Signed)
Vaccines I recommend: PNM 20, shingrix, covid vax , RSV and  flu shot every fall    GO TO THE LAB : Get the blood work     GO TO THE FRONT DESK, PLEASE SCHEDULE YOUR APPOINTMENTS Come back for a physical exam in 4 to 6 months

## 2022-10-24 NOTE — Progress Notes (Addendum)
Subjective:    Patient ID: Jamie Patrick, female    DOB: [birth year], 67 y.o.   MRN: [id number]  DOS:  10/24/2022 Type of visit - description: to re establish . LOV 2018  Since LOV has seen other MDs Reports ongoing problems w/  insomnia, memory loss, weight gain, tremors.   Review of Systems See above   Past Medical History:  Diagnosis Date   Allergy    Anal fissure    Anemia, unspecified    Anxiety    Colon polyps    BENIGN   CTS (carpal tunnel syndrome)    question of   Depression    Dysplasia of cervix, low grade (CIN 1)    h/o CIN-1/CIN2   History of Helicobacter pylori infection    Hypothyroidism    Osteopenia    as reported by pt, DEXA @ gyn   Prediabetes 09/01/2012   Vitamin D deficiency     Past Surgical History:  Procedure Laterality Date   APPENDECTOMY     CARPAL TUNNEL RELEASE Bilateral 09/11/2017   CHOLECYSTECTOMY  2010   CRYOTHERAPY     FOR CERVICAL DYSPLASIA CIN-1/CIN-2   TONSILLECTOMY     uterine polyps  2003   Dr.Fernadez/ RESECTOSCOPIC POLYPECTOMY   Social History   Socioeconomic History   Marital status: Married    Spouse name: Not on file   Number of children: 0   Years of education: Not on file   Highest education level: Not on file  Occupational History   Occupation: currently not working  Tobacco Use   Smoking status: Never   Smokeless tobacco: Never  Vaping Use   Vaping status: Not on file  Substance and Sexual Activity   Alcohol use: No    Comment:     Drug use: No   Sexual activity: Never  Other Topics Concern   Not on file  Social History Narrative   Original from [place]   Household- pt and husband    Social Determinants of Health   Financial Resource Strain: Low Risk  (10/19/2020)   [facility] prior to 06/07/2022., [facility] visits prior to 06/07/2022.   Overall Financial Resource Strain (CARDIA)    Difficulty of Paying Living Expenses: Not very  hard  Food Insecurity: No Food Insecurity (11/11/2021)   Received from [facility], [facility] prior to 06/07/2022., [facility] visits prior to 06/07/2022[facility]   Hunger Vital Sign    Worried About Running Out of Food in the Last Year: Never true    Ran Out of Food in the Last Year: Never true  Transportation Needs: No Transportation Needs (11/11/2021)   [facility], [facility] visits prior to 06/07/2022., [facility] visits prior to 06/07/2022., [facility]   PRAPARE - Transportation    Lack of Transportation (Medical): No    Lack of Transportation (Non-Medical): No  Physical Activity: Inactive (10/19/2020)   Received from [facility] visits prior to 06/07/2022., [facility] visits prior to 06/07/2022.   Exercise Vital Sign    Days of Exercise per Week: 0 days    Minutes of Exercise per Session: 0 min  Stress: Stress Concern Present (10/19/2020)   [facility] visits prior to 06/07/2022., [facility] visits prior to 06/07/2022.   [place]  [employer] - Occupational Stress Questionnaire    Feeling of Stress : Rather much  Social Connections: Socially Integrated (10/19/2020)   [facility] visits prior to 06/07/2022., [facility] visits prior to 06/07/2022.   Social Advertising account executive [NHANES]    Frequency of Communication with Friends and Family: More than three times a week    Frequency of Social Gatherings with Friends and Family: Once a week    Attends Religious Services: More than 4 times per year    Active Member of [employer] or Organizations: Yes    Attends Banker Meetings: 1 to 4 times per year    Marital Status: Married  Catering manager Violence: Not At Risk (10/19/2020)   [facility]  [facility] prior to 06/07/2022., [facility] visits prior to 06/07/2022.   Humiliation, Afraid, Rape, and Kick questionnaire    Fear of Current or Ex-Partner: No    Emotionally Abused: No    Physically Abused: No    Sexually Abused: No   Family History  Problem Relation Age of Onset   Dementia Father        died at age 86   Osteoporosis Sister        2 sisters    Diabetes Brother        uncles, GM   Hypertension Brother    Breast cancer Other        distant cousins   Heart attack Neg Hx    Colon cancer Neg Hx      Current Outpatient Medications  Medication Instructions   busPIRone (BUSPAR) 10 MG tablet 1 tablet, Oral, 2 times daily   Calcium Carbonate-Vitamin D (CALCIUM + D PO) 1 tablet, Oral, Daily   escitalopram (LEXAPRO) 20 mg, Oral, Daily   estradiol (ESTRACE) 1 MG tablet 1 tablet, Oral, Daily   fish oil-omega-3 fatty acids 2 g, Daily   ipratropium (ATROVENT) 0.06 % nasal spray 2 sprays, Each Nare, 4 times daily   levothyroxine (SYNTHROID, LEVOTHROID) 50 MCG tablet TAKE ONE TABLET BY MOUTH EVERY DAY   MAGNESIUM PO 275 mg, Oral   medroxyPROGESTERone (PROVERA) 2.5 mg, Oral, Daily       Objective:   Physical Exam BP 118/62 (BP Location: Left Arm, Patient Position: Sitting, Cuff Size: Normal)   Pulse 85   Temp 98.1 F (36.7 C) (Temporal)   Resp 18   Ht 4\' 10"  (1.473 m)   Wt 142 lb 6.4 oz (64.6 kg)   LMP 02/03/2009   SpO2 98%   BMI 29.76 kg/m  General: Well developed, NAD, BMI noted Neck: No  thyromegaly  HEENT:  Normocephalic . Face symmetric, atraumatic Lungs:  CTA B Normal respiratory effort, no intercostal retractions, no accessory muscle use. Heart: RRR,  no murmur.  Abdomen:  Not distended, soft, non-tender. No rebound or rigidity.   Lower extremities: no pretibial edema bilaterally  Skin: Exposed areas without rash. Not pale. Not jaundice Neurologic:  alert & oriented X3.  Speech normal, gait  appropriate for age and unassisted Strength symmetric and appropriate for age.  Psych: Cognition and judgment appear intact.  Cooperative with normal attention span and concentration.  Behavior appropriate. No anxious or depressed appearing.     Assessment    Assessment  Prediabetes Hypothyroidism Anxiety , depression , insomnia, as of 10/2022 Dr Sandria Manly intolerant to Abilify,Viibrid, used to see Dr Evelene Croon trazadone (didn't work), elavil (stopped  working), [employer] (++s/e). Pamelor: no help Allergic rhinitis Osteopenia -- per gyn, last DEXA 07-2015 Vitamin D deficiency: On oral supplements Menopausal  h/o dysplasia of the cervix CN1/CN2 Rosacea-- sees derm     PLAN: To re establish, LOV 2018 Prediabetes: check A1C , CMP Hypothyroidism: reports good med compliance, labs Anxiety , depression , insomnia apparently not making much progress since 2018 although she looks well today, smiling and in good spirits; under the care of Dr Sandria Manly Decrease memory: states lost her job d/t a memory issue , neuro eval pending HA, also hand tremors: previous PCP referred to neurology  B knee pain: has seen Dr Thamas Jaegers, got local injections, rec to continue working w/ ortho ref: pain Weight gain: weight 2018: 140 pounds, wt today 142. No objective evidence of wt gain, this was shared w/ pt  Preventive care reviewed Tdap 2018 Advise: PNM 20, shingrix, covid vax , RSV and  flu shot every fall  PAP and HPV neg 02/2022 MMG neg 02-2022 Tscore  -1.4  (12-2017) Cscope 09/13/2021: neg, 10 years , see report RTC 4-6 m for CPX

## 2022-10-25 LAB — COMPREHENSIVE METABOLIC PANEL
AG Ratio: 1.7 (calc) (ref 1.0–2.5)
ALT: 19 U/L (ref 6–29)
AST: 18 U/L (ref 10–35)
Albumin: 4.2 g/dL (ref 3.6–5.1)
Alkaline phosphatase (APISO): 52 U/L (ref 37–153)
BUN: 8 mg/dL (ref 7–25)
CO2: 26 mmol/L (ref 20–32)
Calcium: 9.5 mg/dL (ref 8.6–10.4)
Chloride: 103 mmol/L (ref 98–110)
Creat: 0.68 mg/dL (ref 0.50–1.05)
Globulin: 2.5 g/dL (calc) (ref 1.9–3.7)
Glucose, Bld: 102 mg/dL — ABNORMAL HIGH (ref 65–99)
Potassium: 4.5 mmol/L (ref 3.5–5.3)
Sodium: 138 mmol/L (ref 135–146)
Total Bilirubin: 0.7 mg/dL (ref 0.2–1.2)
Total Protein: 6.7 g/dL (ref 6.1–8.1)

## 2022-10-25 LAB — HEMOGLOBIN A1C
Hgb A1c MFr Bld: 5.2 % of total Hgb (ref <5.7)
Mean Plasma Glucose: 103 mg/dL
eAG (mmol/L): 5.7 mmol/L

## 2022-10-25 LAB — TSH: TSH: 1.51 mIU/L (ref 0.40–4.50)

## 2022-10-26 ENCOUNTER — Encounter: Payer: Self-pay | Admitting: Internal Medicine

## 2022-10-26 NOTE — Assessment & Plan Note (Signed)
To re establish, LOV 2018 Prediabetes: check A1C , CMP Hypothyroidism: reports good med compliance, labs Anxiety , depression , insomnia apparently not making much progress since 2018 although she looks well today, smiling and in good spirits; under the care of Dr Sandria Manly Decrease memory: states lost her job d/t a memory issue , neuro eval pending HA, also hand tremors: previous PCP referred to neurology  B knee pain: has seen Dr Thamas Jaegers, got local injections, rec to continue working w/ ortho ref: pain Weight gain: weight 2018: 140 pounds, wt today 142. No objective evidence of wt gain, this was shared w/ pt  Preventive care reviewed Tdap 2018 Advise: PNM 20, shingrix, covid vax , RSV and  flu shot every fall  PAP and HPV neg 02/2022 MMG neg 02-2022 Tscore  -1.4  (12-2017) Cscope 09/13/2021: neg, 10 years , see report RTC 4-6 m for CPX

## 2022-11-06 ENCOUNTER — Ambulatory Visit (INDEPENDENT_AMBULATORY_CARE_PROVIDER_SITE_OTHER): Payer: PRIVATE HEALTH INSURANCE

## 2022-11-06 ENCOUNTER — Encounter: Payer: Self-pay | Admitting: Podiatry

## 2022-11-06 ENCOUNTER — Ambulatory Visit (INDEPENDENT_AMBULATORY_CARE_PROVIDER_SITE_OTHER): Payer: PRIVATE HEALTH INSURANCE | Admitting: Podiatry

## 2022-11-06 DIAGNOSIS — M21619 Bunion of unspecified foot: Secondary | ICD-10-CM

## 2022-11-06 DIAGNOSIS — M21611 Bunion of right foot: Secondary | ICD-10-CM | POA: Diagnosis not present

## 2022-11-06 DIAGNOSIS — M21612 Bunion of left foot: Secondary | ICD-10-CM | POA: Diagnosis not present

## 2022-11-06 DIAGNOSIS — M2042 Other hammer toe(s) (acquired), left foot: Secondary | ICD-10-CM | POA: Diagnosis not present

## 2022-11-06 DIAGNOSIS — L600 Ingrowing nail: Secondary | ICD-10-CM

## 2022-11-06 NOTE — Progress Notes (Signed)
bilateral big toe is going into 2nd toe and toe nail discolored. Concerned for fungus

## 2022-11-07 NOTE — Progress Notes (Signed)
Subjective:   Patient ID: Jamie Patrick, female   DOB: 67 y.o.   MRN: [id number]   HPI Patient presents with husband stating that she has had structural bunion deformity and elevation of the digit left over right and is concerned about this and states that she has family history with aunt and father having this condition.  Mild to moderate discomfort does not smoke likes to be active   Review of Systems  All other systems reviewed and are negative.       Objective:  Physical Exam Vitals and nursing note reviewed.  Constitutional:      Appearance: She is well-developed.  Pulmonary:     Effort: Pulmonary effort is normal.  Musculoskeletal:        General: Normal range of motion.  Skin:    General: Skin is warm.  Neurological:     Mental Status: She is alert.     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  I do think digital correction along with bunion correction could be done but do not recommend this at the current time and I educated her on shoe gear and also support.  Patient will be seen back as needed with all questions answered and surgical intervention could be undertaken  X-rays indicate that there is moderate bunion deformity bilateral elevation of the second digit left significant fashion mild on the right

## 2022-11-21 ENCOUNTER — Encounter: Payer: Self-pay | Admitting: Internal Medicine

## 2022-12-23 ENCOUNTER — Encounter: Payer: Self-pay | Admitting: Internal Medicine

## 2022-12-23 NOTE — Telephone Encounter (Signed)
Send her a list of psych providers

## 2023-01-08 ENCOUNTER — Encounter: Payer: Self-pay | Admitting: Internal Medicine

## 2023-01-08 MED ORDER — LEVOTHYROXINE SODIUM 50 MCG PO TABS: 50.0000 ug | ORAL_TABLET | Freq: Every day | ORAL | 1 refills | Status: DC

## 2023-01-14 ENCOUNTER — Ambulatory Visit: Payer: Medicare HMO

## 2023-01-14 DIAGNOSIS — Z780 Asymptomatic menopausal state: Secondary | ICD-10-CM

## 2023-01-14 DIAGNOSIS — Z0000 Encounter for general adult medical examination without abnormal findings: Secondary | ICD-10-CM

## 2023-01-14 NOTE — Patient Instructions (Signed)
Ms. Jamie Patrick , Thank you for taking time to come for your Medicare Wellness Visit. I appreciate your ongoing commitment to your health goals. Please review the following plan we discussed and let me know if I can assist you in the future.   These are the goals we discussed:  Goals   None     This is a list of the screening recommended for you and due dates:  Health Maintenance  Topic Date Due   Zoster (Shingles) Vaccine (1 of 2) Never done   Pneumonia Vaccine (1 of 1 - PCV) Never done   Flu Shot  07/06/2023*   Medicare Annual Wellness Visit  01/14/2024   Mammogram  02/21/2024   DTaP/Tdap/Td vaccine (3 - Td or Tdap) 08/12/2026   Colon Cancer Screening  09/14/2026   DEXA scan (bone density measurement)  Completed   COVID-19 Vaccine  Completed   Hepatitis C Screening  Completed   HPV Vaccine  Aged Out  *Topic was postponed. The date shown is not the original due date.     Next appointment: Follow up in one year for your annual wellness visit.   Preventive Care 66 Years and Older, Female Preventive care refers to lifestyle choices and visits with your health care provider that can promote health and wellness. What does preventive care include? A yearly physical exam. This is also called an annual well check. Dental exams once or twice a year. Routine eye exams. Ask your health care provider how often you should have your eyes checked. Personal lifestyle choices, including: Daily care of your teeth and gums. Regular physical activity. Eating a healthy diet. Avoiding tobacco and drug use. Limiting alcohol use. Practicing safe sex. Taking low-dose aspirin every day. Taking vitamin and mineral supplements as recommended by your health care provider. What happens during an annual well check? The services and screenings done by your health care provider during your annual well check will depend on your age, overall health, lifestyle risk factors, and family history of  disease. Counseling  Your health care provider may ask you questions about your: Alcohol use. Tobacco use. Drug use. Emotional well-being. Home and relationship well-being. Sexual activity. Eating habits. History of falls. Memory and ability to understand (cognition). Work and work Astronomer. Reproductive health. Screening  You may have the following tests or measurements: Height, weight, and BMI. Blood pressure. Lipid and cholesterol levels. These may be checked every 5 years, or more frequently if you are over 60 years old. Skin check. Lung cancer screening. You may have this screening every year starting at age 29 if you have a 30-pack-year history of smoking and currently smoke or have quit within the past 15 years. Fecal occult blood test (FOBT) of the stool. You may have this test every year starting at age 51. Flexible sigmoidoscopy or colonoscopy. You may have a sigmoidoscopy every 5 years or a colonoscopy every 10 years starting at age 27. Hepatitis C blood test. Hepatitis B blood test. Sexually transmitted disease (STD) testing. Diabetes screening. This is done by checking your blood sugar (glucose) after you have not eaten for a while (fasting). You may have this done every 1-3 years. Bone density scan. This is done to screen for osteoporosis. You may have this done starting at age 65. Mammogram. This may be done every 1-2 years. Talk to your health care provider about how often you should have regular mammograms. Talk with your health care provider about your test results, treatment options, and if  necessary, the need for more tests. Vaccines  Your health care provider may recommend certain vaccines, such as: Influenza vaccine. This is recommended every year. Tetanus, diphtheria, and acellular pertussis (Tdap, Td) vaccine. You may need a Td booster every 10 years. Zoster vaccine. You may need this after age 102. Pneumococcal 13-valent conjugate (PCV13) vaccine. One  dose is recommended after age 85. Pneumococcal polysaccharide (PPSV23) vaccine. One dose is recommended after age 29. Talk to your health care provider about which screenings and vaccines you need and how often you need them. This information is not intended to replace advice given to you by your health care provider. Make sure you discuss any questions you have with your health care provider. Document Released: 04/20/2015 Document Revised: 12/12/2015 Document Reviewed: 01/23/2015 Elsevier Interactive Patient Education  2017 [employer].  Fall Prevention in the Home Falls can cause injuries. They can happen to people of all ages. There are many things you can do to make your home safe and to help prevent falls. What can I do on the outside of my home? Regularly fix the edges of walkways and driveways and fix any cracks. Remove anything that might make you trip as you walk through a door, such as a raised step or threshold. Trim any bushes or trees on the path to your home. Use bright outdoor lighting. Clear any walking paths of anything that might make someone trip, such as rocks or tools. Regularly check to see if handrails are loose or broken. Make sure that both sides of any steps have handrails. Any raised decks and porches should have guardrails on the edges. Have any leaves, snow, or ice cleared regularly. Use sand or salt on walking paths during winter. Clean up any spills in your garage right away. This includes oil or grease spills. What can I do in the bathroom? Use night lights. Install grab bars by the toilet and in the tub and shower. Do not use towel bars as grab bars. Use non-skid mats or decals in the tub or shower. If you need to sit down in the shower, use a plastic, non-slip stool. Keep the floor dry. Clean up any water that spills on the floor as soon as it happens. Remove soap buildup in the tub or shower regularly. Attach bath mats securely with double-sided  non-slip rug tape. Do not have throw rugs and other things on the floor that can make you trip. What can I do in the bedroom? Use night lights. Make sure that you have a light by your bed that is easy to reach. Do not use any sheets or blankets that are too big for your bed. They should not hang down onto the floor. Have a firm chair that has side arms. You can use this for support while you get dressed. Do not have throw rugs and other things on the floor that can make you trip. What can I do in the kitchen? Clean up any spills right away. Avoid walking on wet floors. Keep items that you use a lot in easy-to-reach places. If you need to reach something above you, use a strong step stool that has a grab bar. Keep electrical cords out of the way. Do not use floor polish or wax that makes floors slippery. If you must use wax, use non-skid floor wax. Do not have throw rugs and other things on the floor that can make you trip. What can I do with my stairs? Do not leave any items  on the stairs. Make sure that there are handrails on both sides of the stairs and use them. Fix handrails that are broken or loose. Make sure that handrails are as long as the stairways. Check any carpeting to make sure that it is firmly attached to the stairs. Fix any carpet that is loose or worn. Avoid having throw rugs at the top or bottom of the stairs. If you do have throw rugs, attach them to the floor with carpet tape. Make sure that you have a light switch at the top of the stairs and the bottom of the stairs. If you do not have them, ask someone to add them for you. What else can I do to help prevent falls? Wear shoes that: Do not have high heels. Have rubber bottoms. Are comfortable and fit you well. Are closed at the toe. Do not wear sandals. If you use a stepladder: Make sure that it is fully opened. Do not climb a closed stepladder. Make sure that both sides of the stepladder are locked into place. Ask  someone to hold it for you, if possible. Clearly mark and make sure that you can see: Any grab bars or handrails. First and last steps. Where the edge of each step is. Use tools that help you move around (mobility aids) if they are needed. These include: Canes. Walkers. Scooters. Crutches. Turn on the lights when you go into a dark area. Replace any light bulbs as soon as they burn out. Set up your furniture so you have a clear path. Avoid moving your furniture around. If any of your floors are uneven, fix them. If there are any pets around you, be aware of where they are. Review your medicines with your doctor. Some medicines can make you feel dizzy. This can increase your chance of falling. Ask your doctor what other things that you can do to help prevent falls. This information is not intended to replace advice given to you by your health care provider. Make sure you discuss any questions you have with your health care provider. Document Released: 01/18/2009 Document Revised: 08/30/2015 Document Reviewed: 04/28/2014 Elsevier Interactive Patient Education  2017 [employer].

## 2023-01-14 NOTE — Progress Notes (Signed)
Subjective:   Jamie Patrick is a 67 y.o. female who presents for an Initial Medicare Annual Wellness Visit.  Visit Complete: Virtual I connected with  Aileen Fass on 01/14/23 by a audio enabled telemedicine application and verified that I am speaking with the correct person using two identifiers.  Patient Location: Home  Provider Location: Office/Clinic  I discussed the limitations of evaluation and management by telemedicine. The patient expressed understanding and agreed to proceed.  Vital Signs: Because this visit was a virtual/telehealth visit, some criteria may be missing or patient reported. Any vitals not documented were not able to be obtained and vitals that have been documented are patient reported.   Cardiac Risk Factors include: advanced age (>84men, >74 women)     Objective:    There were no vitals filed for this visit. There is no height or weight on file to calculate BMI.     01/14/2023    1:51 PM 10/24/2022    2:28 PM 03/14/2017    9:01 PM 02/15/2016   11:22 AM 12/04/2014    2:15 PM 09/26/2014   11:46 AM 05/03/2014   11:54 AM  Advanced Directives  Does Patient Have a Medical Advance Directive? No No No No No Yes No  Type of Careers adviser;Living will   Copy of Healthcare Power of Attorney in Chart?      No - copy requested   Would patient like information on creating a medical advance directive? No - Patient declined Yes (ED - Information included in AVS) No - Patient declined No - patient declined information No - patient declined information  No - patient declined information    Current Medications (verified) Outpatient Encounter Medications as of 01/14/2023  Medication Sig   buPROPion (WELLBUTRIN) 100 MG tablet Take 100 mg by mouth at bedtime.   busPIRone (BUSPAR) 10 MG tablet Take 1 tablet by mouth 2 (two) times daily. (Patient not taking: Reported on 01/14/2023)   Calcium Carbonate-Vitamin D (CALCIUM +  D PO) Take 1 tablet by mouth daily.    escitalopram (LEXAPRO) 20 MG tablet Take 1 tablet (20 mg total) by mouth daily.   estradiol (ESTRACE) 1 MG tablet Take 1 tablet by mouth daily.   fish oil-omega-3 fatty acids 1000 MG capsule Take 2 g by mouth daily.   ipratropium (ATROVENT) 0.06 % nasal spray Place 2 sprays into both nostrils 4 (four) times daily.   levothyroxine (SYNTHROID) 50 MCG tablet Take 1 tablet (50 mcg total) by mouth daily before breakfast.   MAGNESIUM PO Take 275 mg by mouth.   medroxyPROGESTERone (PROVERA) 2.5 MG tablet Take 2.5 mg by mouth daily.   No facility-administered encounter medications on file as of 01/14/2023.    Allergies (verified) Suvorexant, Zolpidem, Ceftriaxone sodium, Cephalosporins, Codeine, and Sulfonamide derivatives   History: Past Medical History:  Diagnosis Date   Allergy    Anal fissure    Anemia, unspecified    Anxiety    Colon polyps    BENIGN   CTS (carpal tunnel syndrome)    question of   Depression    Dysplasia of cervix, low grade (CIN 1)    h/o CIN-1/CIN2   History of Helicobacter pylori infection    Hypothyroidism    Osteopenia    as reported by pt, DEXA @ gyn   Prediabetes 09/01/2012   Vitamin D deficiency    Past Surgical History:  Procedure Laterality Date  APPENDECTOMY     CARPAL TUNNEL RELEASE Bilateral 09/11/2017   CHOLECYSTECTOMY  2010   CRYOTHERAPY     FOR CERVICAL DYSPLASIA CIN-1/CIN-2   TONSILLECTOMY     uterine polyps  2003   Dr.Fernadez/ RESECTOSCOPIC POLYPECTOMY   Family History  Problem Relation Age of Onset   Dementia Father        died at age 51   Osteoporosis Sister        2 sisters    Diabetes Brother        uncles, GM   Hypertension Brother    Breast cancer Other        distant cousins   Heart attack Neg Hx    Colon cancer Neg Hx    Social History   Socioeconomic History   Marital status: Married    Spouse name: Not on file   Number of children: 0   Years of education: Not on file    Highest education level: Not on file  Occupational History   Occupation: currently not working  Tobacco Use   Smoking status: Never   Smokeless tobacco: Never  Vaping Use   Vaping status: Not on file  Substance and Sexual Activity   Alcohol use: No    Comment:     Drug use: No   Sexual activity: Never  Other Topics Concern   Not on file  Social History Narrative   Original from [place]   Household- pt and husband    Social Determinants of Health   Financial Resource Strain: Low Risk  (01/14/2023)   Overall Financial Resource Strain (CARDIA)    Difficulty of Paying Living Expenses: Not hard at all  Food Insecurity: No Food Insecurity (01/14/2023)   Hunger Vital Sign    Worried About Running Out of Food in the Last Year: Never true    Ran Out of Food in the Last Year: Never true  Transportation Needs: No Transportation Needs (01/14/2023)   PRAPARE - Administrator, Civil Service (Medical): No    Lack of Transportation (Non-Medical): No  Physical Activity: Inactive (01/14/2023)   Exercise Vital Sign    Days of Exercise per Week: 0 days    Minutes of Exercise per Session: 0 min  Stress: Stress Concern Present (01/14/2023)   [employer] of Occupational Health - Occupational Stress Questionnaire    Feeling of Stress : Rather much  Social Connections: Moderately Integrated (01/14/2023)   Social Connection and Isolation Panel [NHANES]    Frequency of Communication with Friends and Family: Once a week    Frequency of Social Gatherings with Friends and Family: Once a week    Attends Religious Services: More than 4 times per year    Active Member of [employer] or Organizations: Yes    Attends Engineer, structural: More than 4 times per year    Marital Status: Married    Tobacco Counseling Counseling given: Not Answered   Clinical Intake:  Pre-visit preparation completed: Yes  Pain : No/denies pain  Nutritional Risks: None Diabetes: No  How often do you  need to have someone help you when you read instructions, pamphlets, or other written materials from your doctor or pharmacy?: 1 - Never  Interpreter Needed?: No  Information entered by :: [employer], CMA   Activities of Daily Living    01/14/2023    1:44 PM  In your present state of health, do you have any difficulty performing the following activities:  Hearing? 0  Vision? 1  Comment early stage cataracts, worse in left eye  Difficulty concentrating or making decisions? 1  Comment memory  Walking or climbing stairs? 0  Dressing or bathing? 0  Doing errands, shopping? 0  Preparing Food and eating ? N  Using the Toilet? N  In the past six months, have you accidently leaked urine? N  Do you have problems with loss of bowel control? N  Managing your Medications? N  Managing your Finances? N  Housekeeping or managing your Housekeeping? N    Patient Care Team: Wanda Plump, MD as PCP - General (Internal Medicine) Enid Baas, MD as Consulting Physician (Sports Medicine) Linna Darner, RD as Dietitian (Family Medicine) Dorna Leitz, MD as Referring Physician (Gastroenterology) Lynnea Ferrier., MD as Referring Physician (Obstetrics and Gynecology)  Indicate any recent Medical Services you may have received from other than [facility] providers in the past year (date may be approximate).     Assessment:   This is a routine wellness examination for [place].  Hearing/Vision screen No results found.   Goals Addressed   None    Depression Screen    01/14/2023    2:04 PM 10/24/2022    2:58 PM 12/22/2016    9:46 AM 02/15/2016   11:22 AM 12/19/2015    9:06 AM 12/15/2014    8:38 AM 12/04/2014    2:14 PM  PHQ 2/9 Scores  PHQ - 2 Score 6 5 0 0 0 0 0  PHQ- 9 Score 19 20 0        Fall Risk    01/14/2023    1:51 PM 10/24/2022    2:27 PM 02/15/2016   11:22 AM 12/19/2015    9:06 AM 12/15/2014    8:38 AM  Fall Risk   Falls in the past year? 0 0 No No No  Number falls in past yr:  0 0     Injury with Fall? 0 0     Risk for fall due to : No Fall Risks Orthopedic patient     Follow up Falls evaluation completed Falls evaluation completed       MEDICARE RISK AT HOME: Medicare Risk at Home Any stairs in or around the home?: No If so, are there any without handrails?: No Home free of loose throw rugs in walkways, pet beds, electrical cords, etc?: Yes Adequate lighting in your home to reduce risk of falls?: Yes Life alert?: No Use of a cane, walker or w/c?: No Grab bars in the bathroom?: No Shower chair or bench in shower?: No Elevated toilet seat or a handicapped toilet?: No  TIMED UP AND GO:  Was the test performed? No    Cognitive Function:        01/14/2023    2:12 PM  6CIT Screen  What Year? 0 points  What month? 0 points  What time? 3 points  Count back from 20 0 points  Months in reverse 0 points  Repeat phrase 2 points  Total Score 5 points    Immunizations Immunization History  Administered Date(s) Administered   Influenza Split 02/05/2014   Influenza,inj,Quad PF,6+ Mos 12/27/2014   Influenza-Unspecified 12/07/2015   Moderna Covid-19 Fall Seasonal Vaccine 35yrs & older 01/03/2022   PFIZER Comirnaty(Gray Top)Covid-19 Tri-Sucrose Vaccine 02/23/2021   PFIZER(Purple Top)SARS-COV-2 Vaccination 04/06/2019, 05/04/2019   Pfizer(Comirnaty)Fall Seasonal Vaccine 12 years and older 01/10/2023   Tdap 04/07/2005, 08/11/2016    TDAP status: Up to date  Flu Vaccine status: Due, Education has  been provided regarding the importance of this vaccine. Advised may receive this vaccine at local pharmacy or Health Dept. Aware to provide a copy of the vaccination record if obtained from local pharmacy or Health Dept. Verbalized acceptance and understanding.  Pneumococcal vaccine status: Due, Education has been provided regarding the importance of this vaccine. Advised may receive this vaccine at local pharmacy or Health Dept. Aware to provide a copy of the  vaccination record if obtained from local pharmacy or Health Dept. Verbalized acceptance and understanding.  Covid-19 vaccine status: Information provided on how to obtain vaccines.   Qualifies for Shingles Vaccine? Yes   Zostavax completed No   Shingrix Completed?: No.    Education has been provided regarding the importance of this vaccine. Patient has been advised to call insurance company to determine out of pocket expense if they have not yet received this vaccine. Advised may also receive vaccine at local pharmacy or Health Dept. Verbalized acceptance and understanding.  Screening Tests Health Maintenance  Topic Date Due   Medicare Annual Wellness (AWV)  Never done   Zoster Vaccines- Shingrix (1 of 2) Never done   Pneumonia Vaccine 77+ Years old (1 of 1 - PCV) Never done   INFLUENZA VACCINE  07/06/2023 (Originally 11/06/2022)   MAMMOGRAM  02/21/2024   DTaP/Tdap/Td (3 - Td or Tdap) 08/12/2026   Colonoscopy  09/14/2026   DEXA SCAN  Completed   COVID-19 Vaccine  Completed   Hepatitis C Screening  Completed   HPV VACCINES  Aged Out    Health Maintenance  Health Maintenance Due  Topic Date Due   Medicare Annual Wellness (AWV)  Never done   Zoster Vaccines- Shingrix (1 of 2) Never done   Pneumonia Vaccine 67+ Years old (1 of 1 - PCV) Never done    Colorectal cancer screening: Type of screening: Colonoscopy. Completed 09/13/21. Repeat every 5 years  Mammogram status: Completed 02/20/22. Repeat every year  Bone Density status: Ordered 01/14/23. Pt provided with contact info and advised to call to schedule appt.  Lung Cancer Screening: (Low Dose CT Chest recommended if Age 77-80 years, 20 pack-year currently smoking OR have quit w/in 15years.) does not qualify.   Additional Screening:  Hepatitis C Screening: does qualify; Completed 03/03/07  Vision Screening: Recommended annual ophthalmology exams for early detection of glaucoma and other disorders of the eye. Is the patient up  to date with their annual eye exam?  Yes  Who is the provider or what is the name of the office in which the patient attends annual eye exams? Dr. Nilda Riggs If pt is not established with a provider, would they like to be referred to a provider to establish care? No .   Dental Screening: Recommended annual dental exams for proper oral hygiene  Diabetic Foot Exam: N/a  Community Resource Referral / Chronic Care Management: CRR required this visit?  No   CCM required this visit?  No     Plan:     I have personally reviewed and noted the following in the patient's chart:   Medical and social history Use of alcohol, tobacco or illicit drugs  Current medications and supplements including opioid prescriptions. Patient is not currently taking opioid prescriptions. Functional ability and status Nutritional status Physical activity Advanced directives List of other physicians Hospitalizations, surgeries, and ER visits in previous 12 months Vitals Screenings to include cognitive, depression, and falls Referrals and appointments  In addition, I have reviewed and discussed with patient certain preventive protocols, quality  metrics, and best practice recommendations. A written personalized care plan for preventive services as well as general preventive health recommendations were provided to patient.     Donne Anon, CMA   01/14/2023   After Visit Summary: (MyChart) Due to this being a telephonic visit, the after visit summary with patients personalized plan was offered to patient via MyChart   Nurse Notes: None

## 2023-01-22 ENCOUNTER — Ambulatory Visit (HOSPITAL_BASED_OUTPATIENT_CLINIC_OR_DEPARTMENT_OTHER)
Admission: RE | Admit: 2023-01-22 | Discharge: 2023-01-22 | Disposition: A | Discharge status: Home / Self Care | Payer: Medicare HMO | Source: Ambulatory Visit | Attending: Internal Medicine | Admitting: Internal Medicine

## 2023-01-22 DIAGNOSIS — Z780 Asymptomatic menopausal state: Secondary | ICD-10-CM | POA: Insufficient documentation

## 2023-01-23 ENCOUNTER — Telehealth: Payer: Self-pay | Admitting: Internal Medicine

## 2023-01-23 NOTE — Telephone Encounter (Signed)
FYI: This call has been transferred to triage nurse: the Triage Nurse. Once the result note has been entered staff can address the message at that time.  Patient called in with the following symptoms:  Red Word: Pain in left arm - spread to right, dizziness, headache // Concerned about possible Heart Attack     Please advise at Mobile [phone] (mobile)  Message is routed to Provider Pool.

## 2023-01-23 NOTE — Telephone Encounter (Signed)
Initial Comment Caller states his wife had an epidural 2 weeks ago and she is having increased pain in mostly her left shoulder and arm. She injection was in the left side of her neck but the pain is going all down her arm. Caller said no chest pain. Translation No Nurse Assessment Nurse: D'Heur Ezzard Standing, RN, Adrienne Date/Time (Eastern Time): 01/23/2023 11:02:56 AM Confirm and document reason for call. If symptomatic, describe symptoms. ---Caller states his wife had an epidural two weeks ago with an injection in the left side of her neck; this is a chronic issue where she's been having neck, arm and back pain. She is having increased pain, mostly in her left shoulder, shoulder blade and arm. The pain is going all down her arm. She denies pain in chest or neck. Does the patient have any new or worsening symptoms? ---Yes Will a triage be completed? ---Yes Related visit to physician within the last 2 weeks? ---Yes Does the PT have any chronic conditions? (i.e. diabetes, asthma, this includes High risk factors for pregnancy, etc.) ---Yes List chronic conditions. ---allergies, hypothyroid, chronic neck pain Is this a behavioral health or substance abuse call? ---No Guidelines Guideline Title Affirmed Question Affirmed Notes Nurse Date/Time Lamount Cohen Time) Shoulder Pain Pain is worsened or caused by bending the neck D'Heur Ezzard Standing, RN, Adrienne 01/23/2023 11:08:16 AM PLEASE NOTE: All timestamps contained within this report are represented as [place] Standard Time. CONFIDENTIALTY NOTICE: This fax transmission is intended only for the addressee. It contains information that is legally privileged, confidential or otherwise protected from use or disclosure. If you are not the intended recipient, you are strictly prohibited from reviewing, disclosing, copying using or disseminating any of this information or taking any action in reliance on or regarding this information. If you have  received this fax in error, please notify us immediately by telephone so that we can arrange for its return to [place]. Phone: [phone], Toll-Free: [phone], Fax: [phone] Page: 2 of 2 Call Id: [id number] Disp. Time Lamount Cohen Time) Disposition Final User 01/23/2023 11:12:58 AM SEE PCP WITHIN 3 DAYS Yes D'Heur Ezzard Standing, RN, Hansel Starling Final Disposition 01/23/2023 11:12:58 AM SEE PCP WITHIN 3 DAYS Yes D'Heur Ezzard Standing, RN, Hansel Starling Caller Disagree/Comply Comply Caller Understands Yes PreDisposition Call Doctor Care Advice Given Per Guideline SEE PCP WITHIN 3 DAYS: * You need to be seen within 2 or 3 days. CALL BACK IF: * Weakness or numbness (loss of sensation) occurs * You become worse CARE ADVICE given per Shoulder Pain (Adult) guideline. Comments User: Hansel Starling, D'Heur Ezzard Standing, RN Date/Time Lamount Cohen Time): 01/23/2023 11:09:56 AM Pain is constant; she rates it at 7/10

## 2023-01-23 NOTE — Telephone Encounter (Signed)
Jamie Patrick- will you call Pt- needs an appt with PCP or another provider here within 3 days please.

## 2023-02-13 ENCOUNTER — Other Ambulatory Visit: Payer: Self-pay | Admitting: Medical Genetics

## 2023-02-13 DIAGNOSIS — Z006 Encounter for examination for normal comparison and control in clinical research program: Secondary | ICD-10-CM

## 2023-02-23 ENCOUNTER — Encounter: Payer: Self-pay | Admitting: Internal Medicine

## 2023-02-24 LAB — HM MAMMOGRAPHY

## 2023-02-24 MED ORDER — ESCITALOPRAM OXALATE 20 MG PO TABS: 20.0000 mg | ORAL_TABLET | Freq: Every day | ORAL | 1 refills | Status: DC

## 2023-02-27 ENCOUNTER — Encounter: Payer: Self-pay | Admitting: Internal Medicine

## 2023-02-27 ENCOUNTER — Ambulatory Visit (INDEPENDENT_AMBULATORY_CARE_PROVIDER_SITE_OTHER): Payer: Medicare HMO | Admitting: Internal Medicine

## 2023-02-27 VITALS — BP 126/80 | HR 75 | Temp 97.8°F | Resp 16 | Ht <= 58 in | Wt 145.5 lb

## 2023-02-27 DIAGNOSIS — G4700 Insomnia, unspecified: Secondary | ICD-10-CM

## 2023-02-27 DIAGNOSIS — F419 Anxiety disorder, unspecified: Secondary | ICD-10-CM

## 2023-02-27 DIAGNOSIS — F32A Depression, unspecified: Secondary | ICD-10-CM

## 2023-02-27 NOTE — Progress Notes (Signed)
Subjective:    Patient ID: Jamie Patrick, female    DOB: 1955/04/27, 67 y.o.   MRN: [id number]  DOS:  02/27/2023 Type of visit - description: Medication management  Saw neurology 12/23/2022, they recommended neuropsychological testing.  Randell Patient PhD, neuro psychologist 02/24/2023. The impression is that cognitive symptoms are due to moderate to severe mood symptoms as well as sleep disturbance. They noted significant depression or anxiety. Could consider psychiatry consult.  At this point, she again reports poor memory.  Some anxiety and depression, no suicidal ideas.  Review of Systems See above   Past Medical History:  Diagnosis Date   Allergy    Anal fissure    Anemia, unspecified    Anxiety    Colon polyps    BENIGN   CTS (carpal tunnel syndrome)    question of   Depression    Dysplasia of cervix, low grade (CIN 1)    h/o CIN-1/CIN2   History of Helicobacter pylori infection    Hypothyroidism    Osteopenia    as reported by pt, DEXA @ gyn   Prediabetes 09/01/2012   Vitamin D deficiency     Past Surgical History:  Procedure Laterality Date   APPENDECTOMY     CARPAL TUNNEL RELEASE Bilateral 09/11/2017   CHOLECYSTECTOMY  2010   CRYOTHERAPY     FOR CERVICAL DYSPLASIA CIN-1/CIN-2   TONSILLECTOMY     uterine polyps  2003   Dr.Fernadez/ RESECTOSCOPIC POLYPECTOMY    Current Outpatient Medications  Medication Instructions   buPROPion (WELLBUTRIN) 100 mg, Oral, Daily at bedtime   busPIRone (BUSPAR) 10 MG tablet 1 tablet, 2 times daily   Calcium Carbonate-Vitamin D (CALCIUM + D PO) 1 tablet, Oral, Daily   escitalopram (LEXAPRO) 20 mg, Oral, Daily   estradiol (ESTRACE) 1 MG tablet 1 tablet, Oral, Daily   fish oil-omega-3 fatty acids 2 g, Daily   ipratropium (ATROVENT) 0.06 % nasal spray 2 sprays, Each Nare, 4 times daily   levothyroxine (SYNTHROID) 50 mcg, Oral, Daily before breakfast   MAGNESIUM PO 275 mg, Oral   medroxyPROGESTERone (PROVERA) 2.5  mg, Oral, Daily       Objective:   Physical Exam BP 126/80   Pulse 75   Temp 97.8 F (36.6 C) (Oral)   Resp 16   Ht 4\' 10"  (1.473 m)   Wt 145 lb 8 oz (66 kg)   LMP 02/03/2009   SpO2 97%   BMI 30.41 kg/m  General:   Well developed, NAD, BMI noted. HEENT:  Normocephalic . Face symmetric, atraumatic Lower extremities: no pretibial edema bilaterally  Skin: Not pale. Not jaundice Neurologic:  alert & oriented X3.  Speech normal, gait appropriate for age and unassisted Psych--  Cognition and judgment appear intact.  Cooperative with normal attention span and concentration.  Behavior appropriate. No anxious or depressed appearing.      Assessment   Assessment  Prediabetes Hypothyroidism Anxiety , depression , insomnia, as of 10/2022 Dr Sandria Manly intolerant to Abilify,Viibrid, used to see Dr Evelene Croon trazadone (didn't work), elavil (stopped working), Facilities manager (++s/e). Pamelor: no help Allergic rhinitis Osteopenia -- per gyn, last DEXA 07-2015 Vitamin D deficiency: On oral supplements Menopausal  h/o dysplasia of the cervix CN1/CN2 Rosacea-- sees derm     PLAN: Decreased memory: Saw neurology, was referred to neuropsychiatry evaluation, they felt that anxiety, depression were triggering the lack of memory and recommended first treating those conditions. Anxiety depression insomnia: Currently on Lexapro, Wellbutrin.  She is not  taking buspirone, unclear why. PHQ-9 is elevated, she strongly denies any suicidal ideas. At this point, her symptoms are chronic and resistant to SSRIs and Wellbutrin, recommend to see psychiatry, information provided. Also recommend to seek counseling, differences between counseling and psychiatry explained to patient and her husband, they verbalized understanding. Vaccine advice provided.  See AVS. RTC as scheduled for January 2025  Time spent 30 minutes, chart was reviewed, I had a long discussion about the role of counseling, the role of psychiatry which  is prescribe medications. Multiple questions answered to the best of my ability.

## 2023-02-27 NOTE — Assessment & Plan Note (Signed)
Decreased memory: Saw neurology, was referred to neuropsychiatry evaluation, they felt that anxiety, depression were triggering the lack of memory and recommended first treating those conditions. Anxiety depression insomnia: Currently on Lexapro, Wellbutrin.  She is not taking buspirone, unclear why. PHQ-9 is elevated, she strongly denies any suicidal ideas. At this point, her symptoms are chronic and resistant to SSRIs and Wellbutrin, recommend to see psychiatry, information provided. Also recommend to seek counseling, differences between counseling and psychiatry explained to patient and her husband, they verbalized understanding. Vaccine advice provided.  See AVS. RTC as scheduled for January 2025

## 2023-02-27 NOTE — Patient Instructions (Signed)
Consider: Pneumonia shot  (PNM 20 or PNM 21) Shingrix

## 2023-03-02 ENCOUNTER — Other Ambulatory Visit: Payer: Self-pay | Admitting: Internal Medicine

## 2023-04-06 ENCOUNTER — Ambulatory Visit: Payer: Medicare HMO | Admitting: Internal Medicine

## 2023-04-14 DIAGNOSIS — L811 Chloasma: Secondary | ICD-10-CM | POA: Diagnosis not present

## 2023-04-14 DIAGNOSIS — L719 Rosacea, unspecified: Secondary | ICD-10-CM | POA: Diagnosis not present

## 2023-04-15 ENCOUNTER — Encounter: Payer: Self-pay | Admitting: Internal Medicine

## 2023-04-15 ENCOUNTER — Encounter: Payer: No Typology Code available for payment source | Admitting: Internal Medicine

## 2023-05-05 DIAGNOSIS — M7542 Impingement syndrome of left shoulder: Secondary | ICD-10-CM | POA: Diagnosis not present

## 2023-05-05 DIAGNOSIS — M5412 Radiculopathy, cervical region: Secondary | ICD-10-CM | POA: Diagnosis not present

## 2023-05-18 ENCOUNTER — Other Ambulatory Visit (HOSPITAL_COMMUNITY)
Admission: RE | Admit: 2023-05-18 | Discharge: 2023-05-18 | Disposition: A | Discharge status: Home / Self Care | Payer: Self-pay | Source: Ambulatory Visit | Attending: Medical Genetics | Admitting: Medical Genetics

## 2023-05-18 DIAGNOSIS — Z006 Encounter for examination for normal comparison and control in clinical research program: Secondary | ICD-10-CM | POA: Insufficient documentation

## 2023-05-22 DIAGNOSIS — K080 Exfoliation of teeth due to systemic causes: Secondary | ICD-10-CM | POA: Diagnosis not present

## 2023-05-25 ENCOUNTER — Ambulatory Visit (HOSPITAL_BASED_OUTPATIENT_CLINIC_OR_DEPARTMENT_OTHER)
Admission: RE | Admit: 2023-05-25 | Discharge: 2023-05-25 | Disposition: A | Discharge status: Home / Self Care | Payer: Medicare Other | Source: Ambulatory Visit | Attending: Internal Medicine | Admitting: Internal Medicine

## 2023-05-25 ENCOUNTER — Ambulatory Visit (INDEPENDENT_AMBULATORY_CARE_PROVIDER_SITE_OTHER): Payer: Medicare Other | Admitting: Internal Medicine

## 2023-05-25 ENCOUNTER — Encounter: Payer: Self-pay | Admitting: Internal Medicine

## 2023-05-25 VITALS — BP 116/76 | HR 67 | Temp 97.8°F | Resp 16 | Ht <= 58 in | Wt 145.0 lb

## 2023-05-25 DIAGNOSIS — R1012 Left upper quadrant pain: Secondary | ICD-10-CM | POA: Diagnosis not present

## 2023-05-25 DIAGNOSIS — Z9049 Acquired absence of other specified parts of digestive tract: Secondary | ICD-10-CM | POA: Diagnosis not present

## 2023-05-25 DIAGNOSIS — R079 Chest pain, unspecified: Secondary | ICD-10-CM | POA: Diagnosis not present

## 2023-05-25 LAB — CBC WITH DIFFERENTIAL/PLATELET
Basophils Absolute: 0 10*3/uL (ref 0.0–0.1)
Basophils Relative: 0.5 % (ref 0.0–3.0)
Eosinophils Absolute: 0.1 10*3/uL (ref 0.0–0.7)
Eosinophils Relative: 1.3 % (ref 0.0–5.0)
HCT: 38.5 % (ref 36.0–46.0)
Hemoglobin: 12.9 g/dL (ref 12.0–15.0)
Lymphocytes Relative: 46.8 % — ABNORMAL HIGH (ref 12.0–46.0)
Lymphs Abs: 2.7 10*3/uL (ref 0.7–4.0)
MCHC: 33.4 g/dL (ref 30.0–36.0)
MCV: 98.2 fL (ref 78.0–100.0)
Monocytes Absolute: 0.4 10*3/uL (ref 0.1–1.0)
Monocytes Relative: 6.8 % (ref 3.0–12.0)
Neutro Abs: 2.5 10*3/uL (ref 1.4–7.7)
Neutrophils Relative %: 44.6 % (ref 43.0–77.0)
Platelets: 201 10*3/uL (ref 150.0–400.0)
RBC: 3.92 Mil/uL (ref 3.87–5.11)
RDW: 14.5 % (ref 11.5–15.5)
WBC: 5.7 10*3/uL (ref 4.0–10.5)

## 2023-05-25 LAB — COMPREHENSIVE METABOLIC PANEL
ALT: 10 U/L (ref 0–35)
AST: 14 U/L (ref 0–37)
Albumin: 4.1 g/dL (ref 3.5–5.2)
Alkaline Phosphatase: 54 U/L (ref 39–117)
BUN: 9 mg/dL (ref 6–23)
CO2: 26 meq/L (ref 19–32)
Calcium: 8.9 mg/dL (ref 8.4–10.5)
Chloride: 103 meq/L (ref 96–112)
Creatinine, Ser: 0.59 mg/dL (ref 0.40–1.20)
GFR: 93.83 mL/min (ref >60.00)
Glucose, Bld: 90 mg/dL (ref 70–99)
Potassium: 4.6 meq/L (ref 3.5–5.1)
Sodium: 137 meq/L (ref 135–145)
Total Bilirubin: 0.7 mg/dL (ref 0.2–1.2)
Total Protein: 6.9 g/dL (ref 6.0–8.3)

## 2023-05-25 MED ORDER — DICYCLOMINE HCL 20 MG PO TABS: 20.0000 mg | ORAL_TABLET | Freq: Three times a day (TID) | ORAL | 0 refills | Status: DC | PRN

## 2023-05-25 NOTE — Patient Instructions (Addendum)
   GO TO THE LAB : Get the blood work     Next visit with me scheduled for October   STOP BY THE FIRST FLOOR: Get a chest x-ray Arrange for an abdominal ultrasound  Stop Buscopin, trial with Bentyl

## 2023-05-25 NOTE — Progress Notes (Unsigned)
Subjective:    Patient ID: Jamie Patrick, female    DOB: 1955-11-12, 68 y.o.   MRN: [id number]  DOS:  05/25/2023 Type of visit - description: acute  Chief complaint is left upper abdominal pain "for 1 month". On chart review as far back as 2023. Pain is not daily, on and off, may last 1 or 2 hours. Located at the left upper quadrant of the abdomen.  Described as sharp but not burning. No associated nausea vomiting diarrhea.  No blood in the stools. No dysuria or gross hematuria. Not clearly associated with any food intake. No previous rash. She has tried an OTC antispasmodic "Buscapine:"  she got from [place] and that seemed to help to some degree.   Review of Systems See above   Past Medical History:  Diagnosis Date   Allergy    Anal fissure    Anemia, unspecified    Anxiety    Colon polyps    BENIGN   CTS (carpal tunnel syndrome)    question of   Depression    Dysplasia of cervix, low grade (CIN 1)    h/o CIN-1/CIN2   History of Helicobacter pylori infection    Hypothyroidism    Osteopenia    as reported by pt, DEXA @ gyn   Prediabetes 09/01/2012   Vitamin D deficiency     Past Surgical History:  Procedure Laterality Date   APPENDECTOMY     CARPAL TUNNEL RELEASE Bilateral 09/11/2017   CHOLECYSTECTOMY  2010   CRYOTHERAPY     FOR CERVICAL DYSPLASIA CIN-1/CIN-2   TONSILLECTOMY     uterine polyps  2003   Dr.Fernadez/ RESECTOSCOPIC POLYPECTOMY    Current Outpatient Medications  Medication Instructions   buPROPion (WELLBUTRIN) 100 mg, Daily at bedtime   Calcium Carbonate-Vitamin D (CALCIUM + D PO) 1 tablet, Daily   escitalopram (LEXAPRO) 20 mg, Oral, Daily   fish oil-omega-3 fatty acids 2 g, Daily   ipratropium (ATROVENT) 0.06 % nasal spray 2 sprays, Each Nare, 4 times daily   levothyroxine (SYNTHROID) 50 mcg, Oral, Daily before breakfast   MAGNESIUM PO 275 mg   medroxyPROGESTERone (PROVERA) 2.5 mg, Daily       Objective:   Physical Exam BP  116/76   Pulse 67   Temp 97.8 F (36.6 C) (Oral)   Resp 16   Ht 4\' 10"  (1.473 m)   Wt 145 lb (65.8 kg)   LMP 02/03/2009   SpO2 97%   BMI 30.31 kg/m  General:   Well developed, NAD, BMI noted.  HEENT:  Normocephalic . Face symmetric, atraumatic Lungs:  CTA B Normal respiratory effort, no intercostal retractions, no accessory muscle use. Heart: RRR,  no murmur.  Abdomen:  Not distended, soft, non-tender. No rebound or rigidity.   Skin: Not pale. Not jaundice Lower extremities: no pretibial edema bilaterally  Neurologic:  alert & oriented X3.  Speech normal, gait appropriate for age and unassisted Psych--  Cognition and judgment appear intact.  Cooperative with normal attention span and concentration.  Behavior appropriate. No anxious or depressed appearing.     Assessment   Assessment  Prediabetes Hypothyroidism Anxiety , depression , insomnia, as of 10/2022 Dr Sandria Manly intolerant to Abilify,Viibrid, used to see Dr Evelene Croon trazadone (didn't work), elavil (stopped working), Facilities manager (++s/e). Pamelor: no help Allergic rhinitis Osteopenia -- per gyn, last DEXA 07-2015 Vitamin D deficiency: On oral supplements Menopausal  h/o dysplasia of the cervix CN1/CN2 Rosacea-- sees derm     PLAN: Chronic, left  upper abdominal pain: Extensive chart review: 06/07/2021: GI office visit: They noted  a CT of the abdomen and pelvis without contrast done 05-2021: no acute findings. She has a history of Helicobacter infection. Order H. pylori antigen in stool. EGD 07/23/2021: Erythematous stomach, biopsies: GASTRIC ANTRAL AND OXYNTIC/BODY TYPE MUCOSA WITH CHRONIC GASTRITIS AND FOCAL           INTESTINAL METAPLASIA.Marland Kitchen NO EVIDENCE OF ACUTE INFLAMMATION OR MALIGNANCY.  MMUNOHISTOCHEMICAL STAIN FOR HELICOBACTER PYLORI ORGANISMS, NEGATIVE. 09/2021: H. pylori stool negative. 12-2021: Normal colonoscopy Plan: CBC, CMP, US abdomen, chest x-ray.  If symptoms persist recommend to see GI again.  Trial with  Bentyl

## 2023-05-26 NOTE — Assessment & Plan Note (Signed)
Chronic, left upper abdominal pain: Extensive chart review: 06/07/2021: GI office visit: They noted  a CT of the abdomen and pelvis without contrast done 05-2021: no acute findings. She has a history of Helicobacter infection. Order H. pylori antigen in stool. EGD 07/23/2021: Erythematous stomach, biopsies: GASTRIC ANTRAL AND OXYNTIC/BODY TYPE MUCOSA WITH CHRONIC GASTRITIS AND FOCAL           INTESTINAL METAPLASIA.Marland Kitchen NO EVIDENCE OF ACUTE INFLAMMATION OR MALIGNANCY.  MMUNOHISTOCHEMICAL STAIN FOR HELICOBACTER PYLORI ORGANISMS, NEGATIVE. 09/2021: H. pylori stool negative. 12-2021: Normal colonoscopy Plan: CBC, CMP, US abdomen, chest x-ray.  If symptoms persist recommend to see GI again.  Trial with Bentyl

## 2023-05-27 ENCOUNTER — Encounter: Payer: Self-pay | Admitting: Internal Medicine

## 2023-05-29 LAB — GENECONNECT MOLECULAR SCREEN: Genetic Analysis Overall Interpretation: NEGATIVE

## 2023-05-31 ENCOUNTER — Other Ambulatory Visit: Payer: Self-pay | Admitting: Internal Medicine

## 2023-06-01 NOTE — Telephone Encounter (Signed)
 Needs to call the doctor who prescribed this medication

## 2023-06-04 ENCOUNTER — Encounter: Payer: Self-pay | Admitting: Internal Medicine

## 2023-06-04 DIAGNOSIS — R1012 Left upper quadrant pain: Secondary | ICD-10-CM

## 2023-06-05 ENCOUNTER — Encounter: Payer: Self-pay | Admitting: Internal Medicine

## 2023-06-05 NOTE — Telephone Encounter (Signed)
 Pt was seen on 05/25/23, advised to try a trial of Bentyl. I have called the read room to read the CXR that was done that day.

## 2023-06-05 NOTE — Addendum Note (Signed)
 Addended by: Silvio Pate on: 06/05/2023 03:16 PM   Modules accepted: Orders

## 2023-06-18 ENCOUNTER — Encounter: Payer: Self-pay | Admitting: Internal Medicine

## 2023-06-19 ENCOUNTER — Other Ambulatory Visit: Payer: Self-pay | Admitting: Internal Medicine

## 2023-06-19 ENCOUNTER — Encounter: Payer: Self-pay | Admitting: Gastroenterology

## 2023-06-19 MED ORDER — LEVOTHYROXINE SODIUM 50 MCG PO TABS: 50.0000 ug | ORAL_TABLET | Freq: Every day | ORAL | 1 refills | Status: DC

## 2023-06-29 ENCOUNTER — Encounter: Payer: Self-pay | Admitting: Internal Medicine

## 2023-06-30 ENCOUNTER — Ambulatory Visit (INDEPENDENT_AMBULATORY_CARE_PROVIDER_SITE_OTHER): Admitting: Internal Medicine

## 2023-06-30 ENCOUNTER — Encounter: Payer: Self-pay | Admitting: Internal Medicine

## 2023-06-30 VITALS — BP 134/82 | HR 89 | Temp 98.3°F | Resp 16 | Ht <= 58 in | Wt 139.2 lb

## 2023-06-30 DIAGNOSIS — R1012 Left upper quadrant pain: Secondary | ICD-10-CM | POA: Diagnosis not present

## 2023-06-30 DIAGNOSIS — F419 Anxiety disorder, unspecified: Secondary | ICD-10-CM | POA: Diagnosis not present

## 2023-06-30 DIAGNOSIS — F32A Depression, unspecified: Secondary | ICD-10-CM

## 2023-06-30 DIAGNOSIS — G4700 Insomnia, unspecified: Secondary | ICD-10-CM | POA: Diagnosis not present

## 2023-06-30 MED ORDER — NORTRIPTYLINE HCL 10 MG PO CAPS: 10.0000 mg | ORAL_CAPSULE | Freq: Every day | ORAL | 1 refills | Status: DC

## 2023-06-30 NOTE — Progress Notes (Unsigned)
 Subjective:    Patient ID: Jamie Patrick, female    DOB: [birth year], 68 y.o.   MRN: [id number]  DOS:  06/30/2023 Type of visit - description: Acute, here with her husband  See last visit, we had extensive conversation about chronic left upper quadrant abdominal pain. Prescribed Bentyl, she took it 3 times a day and sometimes even 4 times a day with minimal to no help. Continue with pain at the abdomen, left and also sometimes on the right side.  No fever or chills No diarrhea No nausea vomiting No LUTS.   Review of Systems See above   Past Medical History:  Diagnosis Date   Allergy    Anal fissure    Anemia, unspecified    Anxiety    Colon polyps    BENIGN   CTS (carpal tunnel syndrome)    question of   Depression    Dysplasia of cervix, low grade (CIN 1)    h/o CIN-1/CIN2   History of Helicobacter pylori infection    Hypothyroidism    Osteopenia    as reported by pt, DEXA @ gyn   Prediabetes 09/01/2012   Vitamin D deficiency     Past Surgical History:  Procedure Laterality Date   APPENDECTOMY     CARPAL TUNNEL RELEASE Bilateral 09/11/2017   CHOLECYSTECTOMY  2010   CRYOTHERAPY     FOR CERVICAL DYSPLASIA CIN-1/CIN-2   TONSILLECTOMY     uterine polyps  2003   Dr.Fernadez/ RESECTOSCOPIC POLYPECTOMY    Current Outpatient Medications  Medication Instructions   buPROPion (WELLBUTRIN) 100 mg, Daily at bedtime   Calcium Carbonate-Vitamin D (CALCIUM + D PO) 1 tablet, Daily   dicyclomine (BENTYL) 20 mg, Oral, 3 times daily PRN   escitalopram (LEXAPRO) 20 mg, Oral, Daily   fish oil-omega-3 fatty acids 2 g, Daily   levothyroxine (SYNTHROID) 50 mcg, Oral, Daily before breakfast   MAGNESIUM PO 275 mg   medroxyPROGESTERone (PROVERA) 2.5 mg, Daily       Objective:   Physical Exam BP 134/82   Pulse 89   Temp 98.3 F (36.8 C) (Oral)   Resp 16   Ht 4\' 10"  (1.473 m)   Wt 139 lb 4 oz (63.2 kg)   LMP 02/03/2009   SpO2 97%   BMI 29.10 kg/m  General:    Well developed, NAD, BMI noted. HEENT:  Normocephalic . Face symmetric, atraumatic Abdomen: Not distended, soft, mild tenderness at both sides of the upper abdomen without mass or rebound Lower extremities: no pretibial edema bilaterally  Skin: Not pale. Not jaundice Neurologic:  alert & oriented X3.  Speech normal, gait appropriate for age and unassisted Psych--  Cognition and judgment appear intact.  Cooperative with normal attention span and concentration.  Behavior appropriate. No anxious or depressed appearing.      Assessment     Assessment  Prediabetes Hypothyroidism Anxiety , depression , insomnia, as of 10/2022 Dr Sandria Manly intolerant to Abilify,Viibrid, used to see Dr Evelene Croon trazadone (didn't work), elavil (stopped working), Facilities manager (++s/e). Pamelor: no help Allergic rhinitis Osteopenia -- per gyn, last DEXA 07-2015 Vitamin D deficiency: On oral supplements Menopausal  h/o dysplasia of the cervix CN1/CN2 Rosacea-- sees derm     PLAN: Chronic LUQ abdominal pain. See  last visit, had extensive workup for chronic abdominal pain which was reviewed. Additional workup done: CMP, CBC, abdominal ultrasound, chest x-ray: WNL except for some hepatic steatosis per ultrasound. Tried Bentyl: Not helping much. Plan: Stop Bentyl, check UA,  urine culture. Symptoms could be related to IBS, will start a low-dose of nortriptyline, extensive discussion about possible serotonin syndrome. Follow-up with GI on May 2025 Anxiety  depression insomnia: Self stopped Wellbutrin, on Lexapro

## 2023-06-30 NOTE — Patient Instructions (Addendum)
 Start nortriptyline 10 mg: Take 1 tablet at bedtime for 1 week, then you can take 2 tablets at bedtime.  It may interact with escitalopram (Lexapro). If you experience  symptoms such as: increased anxiety, restlessness, confusion, agitation. Also palpitations, diarrhea increased blood pressure. Stop the medication immediately and let me know.  See gastroenterology as recommended   Check the  blood pressure regularly Blood pressure goal:  between 110/65 and  135/85. If it is consistently higher or lower, let me know

## 2023-07-01 LAB — URINALYSIS, ROUTINE W REFLEX MICROSCOPIC
Bilirubin Urine: NEGATIVE
Leukocytes,Ua: NEGATIVE
Nitrite: NEGATIVE
Specific Gravity, Urine: >=1.03 — AB (ref 1.000–1.030)
Urine Glucose: NEGATIVE
Urobilinogen, UA: 0.2 (ref 0.0–1.0)
pH: 6 (ref 5.0–8.0)

## 2023-07-01 LAB — URINE CULTURE
MICRO NUMBER:: 16244729
Result:: NO GROWTH
SPECIMEN QUALITY:: ADEQUATE

## 2023-07-01 NOTE — Assessment & Plan Note (Signed)
 Chronic LUQ abdominal pain. See  last visit, had extensive workup for chronic abdominal pain which was reviewed. Additional workup done: CMP, CBC, abdominal ultrasound, chest x-ray: WNL except for some hepatic steatosis per ultrasound. Tried Bentyl: Not helping much. Plan: Stop Bentyl, check UA, urine culture. Symptoms could be related to IBS, will start a low-dose of nortriptyline, extensive discussion about possible serotonin syndrome. Follow-up with GI on May 2025 Anxiety  depression insomnia: Self stopped Wellbutrin, on Lexapro

## 2023-07-02 ENCOUNTER — Encounter: Payer: Self-pay | Admitting: Internal Medicine

## 2023-07-16 ENCOUNTER — Ambulatory Visit: Payer: Self-pay

## 2023-07-16 NOTE — Telephone Encounter (Signed)
 Chief Complaint: diarrhea Symptoms: diarrhea, cough, green phlegm Frequency: since Monday Pertinent Negatives: Patient denies fever, vomiting, abd pain, CP, SOB not r/t congestion Disposition: [] ED /[] Urgent Care (no appt availability in office) / [x] Appointment(In office/virtual)/ []  [place] [facility]/ [] Home Care/ [] Refused Recommended Disposition /[] [facility]/ []  Follow-up with PCP Additional Notes: Pt reports diarrhea. Pt states diarrhea began yesterday and has happened twice. Seems to be triggered by eating. Pt denies abd pain or vomiting. States she is able to drink fluids and feels she is staying hydrated. Denies dizziness, weakness, dry mouth, dark urine. States she first started feeling poorly on Monday. States she got chills and felt weak that day. Pt also endorses congestion, cough, and green phlegm. RN advised pt she should be seen within 24 hours and scheduled pt for tomorrow at 0920. RN advised pt if she worsens before then to give [place] a call but if she develops bloody stool, intractable fever, or difficulty breathing to go to the Ed. Pt verbalized understanding.      Copied From CRM [id number]. Reason for Triage: Diarrhea: Pt's husband Tiburcio Bash called because his wife was experiencing cold-like symptoms but is now experiencing bad diarrhea.   Reason for Disposition  [1] MODERATE diarrhea (e.g., 4-6 times / day more than normal) AND [2] present > 48 hours (2 days)  Answer Assessment - Initial Assessment Questions 1. DIARRHEA SEVERITY: "How bad is the diarrhea?" "How many more stools have you had in the past 24 hours than normal?"    - NO DIARRHEA (SCALE 0)   - MILD (SCALE 1-3): Few loose or mushy BMs; increase of 1-3 stools over normal daily number of stools; mild increase in ostomy output.   -  MODERATE (SCALE 4-7): Increase of 4-6 stools daily over normal; moderate increase in ostomy output.   -  SEVERE (SCALE 8-10; OR "WORST POSSIBLE"): Increase of 7 or more  stools daily over normal; moderate increase in ostomy output; incontinence.     2x in the last 24 hrs 2. ONSET: "When did the diarrhea begin?"      Yesterday 3. BM CONSISTENCY: "How loose or watery is the diarrhea?"      "It is loose" 4. VOMITING: "Are you also vomiting?" If Yes, ask: "How many times in the past 24 hours?"      No 5. ABDOMEN PAIN: "Are you having any abdomen pain?" If Yes, ask: "What does it feel like?" (e.g., crampy, dull, intermittent, constant)      No 6. ABDOMEN PAIN SEVERITY: If present, ask: "How bad is the pain?"  (e.g., Scale 1-10; mild, moderate, or severe)   - MILD (1-3): doesn't interfere with normal activities, abdomen soft and not tender to touch    - MODERATE (4-7): interferes with normal activities or awakens from sleep, abdomen tender to touch    - SEVERE (8-10): excruciating pain, doubled over, unable to do any normal activities       No 7. ORAL INTAKE: If vomiting, "Have you been able to drink liquids?" "How much liquids have you had in the past 24 hours?"     Yes  8. HYDRATION: "Any signs of dehydration?" (e.g., dry mouth [not just dry lips], too weak to stand, dizziness, new weight loss) "When did you last urinate?"     "After diarrhea I was very weak" 9. EXPOSURE: "Have you traveled to a foreign country recently?" "Have you been exposed to anyone with diarrhea?" "Could you have eaten any food that was spoiled?"  No 10. ANTIBIOTIC USE: "Are you taking antibiotics now or have you taken antibiotics in the past 2 months?"       No 11. OTHER SYMPTOMS: "Do you have any other symptoms?" (e.g., fever, blood in stool)        Hard for her to breathe last night. States she has had this before. "I had a lot of phlegm." Pt endorses cough and states it was yellow. Denies difficulty breathing otherwise.  Monday AM states she was very weak and cold. States she had to grab a blanket.  States she had soup today and then she had diarrhea.  Protocols used:  [facility]

## 2023-07-16 NOTE — Telephone Encounter (Deleted)
 Message from [place] D sent at 07/16/2023  4:17 PM EDT  Copied From CRM [id number]. Reason for Triage: Diarrhea: Pt's husband Tiburcio Bash called because his wife was experiencing cold-like symptoms but is now experiencing bad diarrhea.

## 2023-07-17 ENCOUNTER — Ambulatory Visit (INDEPENDENT_AMBULATORY_CARE_PROVIDER_SITE_OTHER): Admitting: Family

## 2023-07-17 ENCOUNTER — Encounter: Payer: Self-pay | Admitting: Family

## 2023-07-17 VITALS — BP 114/72 | HR 75 | Temp 98.1°F | Ht <= 58 in | Wt 140.8 lb

## 2023-07-17 DIAGNOSIS — H9202 Otalgia, left ear: Secondary | ICD-10-CM

## 2023-07-17 DIAGNOSIS — R051 Acute cough: Secondary | ICD-10-CM

## 2023-07-17 LAB — POC COVID19 BINAXNOW: SARS Coronavirus 2 Ag: NEGATIVE

## 2023-07-17 LAB — POCT INFLUENZA A/B
Influenza A, POC: NEGATIVE
Influenza B, POC: NEGATIVE

## 2023-07-17 MED ORDER — FLUTICASONE PROPIONATE 50 MCG/ACT NA SUSP: 2.0000 | Freq: Every day | NASAL | 6 refills | Status: DC

## 2023-07-17 MED ORDER — AZITHROMYCIN 250 MG PO TABS: ORAL_TABLET | ORAL | 0 refills | Status: DC

## 2023-07-17 MED ORDER — PROMETHAZINE-DM 6.25-15 MG/5ML PO SYRP: 5.0000 mL | ORAL_SOLUTION | Freq: Four times a day (QID) | ORAL | 0 refills | Status: DC | PRN

## 2023-07-17 NOTE — Progress Notes (Signed)
 Jamie Patrick is a 68 y.o. 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      escitalopram (LEXAPRO) 20 MG tablet Take 1 tablet (20 mg total) by mouth daily. 90 tablet 1   fish oil-omega-3 fatty acids 1000 MG capsule Take 2 g by mouth daily.     fluticasone (FLONASE) 50 MCG/ACT nasal spray Place 2 sprays into both nostrils daily. 16 g 6   levothyroxine (SYNTHROID) 50 MCG tablet Take 1 tablet (50 mcg total) by mouth daily before breakfast. 90 tablet 1   MAGNESIUM PO Take 275 mg by mouth.     medroxyPROGESTERone (PROVERA) 2.5 MG tablet Take 2.5 mg by mouth daily.     nortriptyline (PAMELOR) 10 MG capsule Take 1-2 capsules (10-20 mg total) by mouth at bedtime. 60 capsule 1   promethazine-dextromethorphan (PROMETHAZINE-DM) 6.25-15 MG/5ML  syrup Take 5 mLs by mouth 4 (four) times daily as needed. 118 mL 0   No current facility-administered medications for this visit.    Allergies: Suvorexant, Zolpidem, Ceftriaxone sodium, Cephalosporins, Codeine, and Sulfonamide derivatives  Past Medical History:  Diagnosis Date   Allergy    Anal fissure    Anemia, unspecified    Anxiety    Colon polyps    BENIGN   CTS (carpal tunnel syndrome)    question of   Depression    Dysplasia of cervix, low grade (CIN 1)    h/o CIN-1/CIN2   History of Helicobacter pylori infection    Hypothyroidism    Osteopenia    as reported by pt, DEXA @ gyn   Prediabetes 09/01/2012   Vitamin D deficiency     Past Surgical History:  Procedure Laterality Date   APPENDECTOMY     CARPAL TUNNEL RELEASE Bilateral 09/11/2017   CHOLECYSTECTOMY  2010   CRYOTHERAPY     FOR CERVICAL DYSPLASIA CIN-1/CIN-2   TONSILLECTOMY     uterine polyps  2003   Dr.Fernadez/ RESECTOSCOPIC POLYPECTOMY    Family History  Problem Relation Age of Onset   Dementia Father        died at age 30   Osteoporosis Sister        2 sisters    Diabetes Brother        uncles, GM   Hypertension Brother    Breast cancer Other  distant cousins   Heart attack Neg Hx    Colon cancer Neg Hx     Social History   Tobacco Use   Smoking status: Never   Smokeless tobacco: Never  Substance Use Topics   Alcohol use: No    Comment:      Subjective:   2 day history of cough/ congestion/ diarrhea; notes that just feeling weak- does feel that ears are popping/ feel full; has been taking OTC Alka-Seltzer Plus with limited relief; + fatigue; no chest pain, shortness of breath;   Objective:  Vitals:   07/17/23 0916  BP: 114/72  Pulse: 75  Temp: 98.1 F (36.7 C)  TempSrc: Oral  SpO2: 98%  Weight: 140 lb 12.8 oz (63.9 kg)  Height: 4\' 10"  (1.473 m)    General: Well developed, well nourished, in no acute distress  Skin : Warm and dry.  Head: Normocephalic and atraumatic   Eyes: Sclera and conjunctiva clear; pupils round and reactive to light; extraocular movements intact  Ears: External normal; canals clear; tympanic membranes congested bilaterally/ mild erythema Oropharynx: Pink, supple. No suspicious lesions  Neck: Supple without thyromegaly, adenopathy  Lungs: Respirations unlabored; clear to auscultation bilaterally without wheeze, rales, rhonchi  CVS exam: normal rate and regular rhythm.  Neurologic: Alert and oriented; speech intact; face symmetrical; moves all extremities well; CNII-XII intact without focal deficit   Assessment:  1. Acute cough   2. Left ear pain     Plan:  Rapid flu and rapid COVID are both negative; Rx for Z-pak #1 take as directed, Flonase NS and Promethazine DM; increase fluids, rest and follow up worse, no better.   No follow-ups on file.  Orders Placed This Encounter  Procedures   POC COVID-19    Previously tested for COVID-19:   No    Resident in a congregate (group) care setting:   No    Employed in healthcare setting:   No    Pregnant:   No   POCT Influenza A/B    Requested Prescriptions   Signed Prescriptions Disp Refills   azithromycin (ZITHROMAX) 250 MG tablet 6 tablet 0    Sig: Take 2 tab po the first day then take 1 tablet po daily for 4 days   fluticasone (FLONASE) 50 MCG/ACT nasal spray 16 g 6    Sig: Place 2 sprays into both nostrils daily.   promethazine-dextromethorphan (PROMETHAZINE-DM) 6.25-15 MG/5ML syrup 118 mL 0    Sig: Take 5 mLs by mouth 4 (four) times daily as needed.

## 2023-07-20 ENCOUNTER — Ambulatory Visit: Admitting: Internal Medicine

## 2023-08-03 ENCOUNTER — Encounter: Payer: Self-pay | Admitting: Internal Medicine

## 2023-08-03 ENCOUNTER — Ambulatory Visit (INDEPENDENT_AMBULATORY_CARE_PROVIDER_SITE_OTHER): Payer: Self-pay | Admitting: Psychiatry

## 2023-08-03 DIAGNOSIS — F411 Generalized anxiety disorder: Secondary | ICD-10-CM | POA: Diagnosis not present

## 2023-08-03 DIAGNOSIS — F419 Anxiety disorder, unspecified: Secondary | ICD-10-CM

## 2023-08-03 DIAGNOSIS — F32A Depression, unspecified: Secondary | ICD-10-CM

## 2023-08-03 DIAGNOSIS — G4700 Insomnia, unspecified: Secondary | ICD-10-CM
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
## 2023-08-09 NOTE — Progress Notes (Unsigned)
 Chief Complaint: Primary GI MD: Para Bold  HPI:  *** is a  ***  who was referred to me by Ezell Hollow, MD for a complaint of *** .    Previous patient of [facility].  LUQ pain, nausea, food avoidance since November 2022 after round of doxycycline for ear infection reported previous history of H. pylori that was treated.  H. pylori stool antigen 2023 negative.  She has also undergone EGD/colonoscopy which showed chronic gastritis (H. pylori negative) and normal colonoscopy    Discussed the use of AI scribe software for clinical note transcription with the patient, who gave verbal consent to proceed.  History of Present Illness      PREVIOUS GI WORKUP   US  abdomen complete 05/25/2023 - Hepatic steatosis - Surgically absent gallbladder without biliary dilation  EGD 07/2021 - Esophageal mucosa normal - Moderate erythema in the whole stomach with multiple biopsies taken - Duodenum normal - Gastric biopsy with chronic gastritis, negative H. Pylori  Colonoscopy 09/13/2021 - Terminal ileum normal - Normal examined segments of colon normal - Medium internal hemorrhoids - Repeat 10 years  Past Medical History:  Diagnosis Date   Allergy    Anal fissure    Anemia, unspecified    Anxiety    Colon polyps    BENIGN   CTS (carpal tunnel syndrome)    question of   Depression    Dysplasia of cervix, low grade (CIN 1)    h/o CIN-1/CIN2   History of Helicobacter pylori infection    Hypothyroidism    Osteopenia    as reported by pt, DEXA @ gyn   Prediabetes 09/01/2012   Vitamin D  deficiency     Past Surgical History:  Procedure Laterality Date   APPENDECTOMY     CARPAL TUNNEL RELEASE Bilateral 09/11/2017   CHOLECYSTECTOMY  2010   CRYOTHERAPY     FOR CERVICAL DYSPLASIA CIN-1/CIN-2   TONSILLECTOMY     uterine polyps  2003   Dr.Fernadez/ RESECTOSCOPIC POLYPECTOMY    Current Outpatient Medications  Medication Sig Dispense Refill   azithromycin  (ZITHROMAX ) 250 MG  tablet Take 2 tab po the first day then take 1 tablet po daily for 4 days 6 tablet 0   Calcium Carbonate-Vitamin D  (CALCIUM + D PO) Take 1 tablet by mouth daily.      escitalopram  (LEXAPRO ) 20 MG tablet Take 1 tablet (20 mg total) by mouth daily. 90 tablet 1   fish oil-omega-3 fatty acids 1000 MG capsule Take 2 g by mouth daily.     fluticasone  (FLONASE ) 50 MCG/ACT nasal spray Place 2 sprays into both nostrils daily. 16 g 6   levothyroxine  (SYNTHROID ) 50 MCG tablet Take 1 tablet (50 mcg total) by mouth daily before breakfast. 90 tablet 1   MAGNESIUM PO Take 275 mg by mouth.     medroxyPROGESTERone  (PROVERA ) 2.5 MG tablet Take 2.5 mg by mouth daily.     nortriptyline  (PAMELOR ) 10 MG capsule Take 1-2 capsules (10-20 mg total) by mouth at bedtime. 60 capsule 1   promethazine -dextromethorphan (PROMETHAZINE -DM) 6.25-15 MG/5ML syrup Take 5 mLs by mouth 4 (four) times daily as needed. 118 mL 0   No current facility-administered medications for this visit.    Allergies as of 08/10/2023 - Review Complete 07/17/2023  Allergen Reaction Noted   Suvorexant  Other (See Comments) 05/05/2017   Zolpidem  Other (See Comments) 05/05/2017   Ceftriaxone sodium  07/14/2008   Cephalosporins Nausea And Vomiting    Codeine  Other (See Comments) 03/14/2017  Sulfonamide derivatives  05/08/2007    Family History  Problem Relation Age of Onset   Dementia Father        died at age 75   Osteoporosis Sister        2 sisters    Diabetes Brother        uncles, GM   Hypertension Brother    Breast cancer Other        distant cousins   Heart attack Neg Hx    Colon cancer Neg Hx     Social History   Socioeconomic History   Marital status: Married    Spouse name: Not on file   Number of children: 0   Years of education: Not on file   Highest education level: Bachelor's degree (e.g., BA, AB, BS)  Occupational History   Occupation: currently not working  Tobacco Use   Smoking status: Never   Smokeless  tobacco: Never  Vaping Use   Vaping status: Not on file  Substance and Sexual Activity   Alcohol use: No    Comment:     Drug use: No   Sexual activity: Never  Other Topics Concern   Not on file  Social History Narrative   Original from [place]   Household- pt and husband    Social Drivers of Corporate investment banker Strain: Low Risk  (05/23/2023)   Overall Financial Resource Strain (CARDIA)    Difficulty of Paying Living Expenses: Not hard at all  Food Insecurity: No Food Insecurity (05/23/2023)   Hunger Vital Sign    Worried About Running Out of Food in the Last Year: Never true    Ran Out of Food in the Last Year: Never true  Transportation Needs: No Transportation Needs (05/23/2023)   PRAPARE - Administrator, Civil Service (Medical): No    Lack of Transportation (Non-Medical): No  Physical Activity: Insufficiently Active (05/23/2023)   Exercise Vital Sign    Days of Exercise per Week: 2 days    Minutes of Exercise per Session: 20 min  Stress: Stress Concern Present (05/23/2023)   [employer] of Occupational Health - Occupational Stress Questionnaire    Feeling of Stress : To some extent  Social Connections: Socially Integrated (05/23/2023)   Social Connection and Isolation Panel [NHANES]    Frequency of Communication with Friends and Family: More than three times a week    Frequency of Social Gatherings with Friends and Family: Once a week    Attends Religious Services: More than 4 times per year    Active Member of [employer] or Organizations: Yes    Attends Engineer, structural: More than 4 times per year    Marital Status: Married  Catering manager Violence: Not At Risk (01/14/2023)   Humiliation, Afraid, Rape, and Kick questionnaire    Fear of Current or Ex-Partner: No    Emotionally Abused: No    Physically Abused: No    Sexually Abused: No    Review of Systems:    Constitutional: No weight loss, fever, chills, weakness or fatigue HEENT:  Eyes: No change in vision               Ears, Nose, Throat:  No change in hearing or congestion Skin: No rash or itching Cardiovascular: No chest pain, chest pressure or palpitations   Respiratory: No SOB or cough Gastrointestinal: See HPI and otherwise negative Genitourinary: No dysuria or change in urinary frequency Neurological: No headache, dizziness or syncope Musculoskeletal:  No new muscle or joint pain Hematologic: No bleeding or bruising Psychiatric: No history of depression or anxiety    Physical Exam:  Vital signs: LMP 02/03/2009   Constitutional: NAD, alert and cooperative Head:  Normocephalic and atraumatic. Eyes:   PEERL, EOMI. No icterus. Conjunctiva pink. Respiratory: Respirations even and unlabored. Lungs clear to auscultation bilaterally.   No wheezes, crackles, or rhonchi.  Cardiovascular:  Regular rate and rhythm. No peripheral edema, cyanosis or pallor.  Gastrointestinal:  Soft, nondistended, nontender. No rebound or guarding. Normal bowel sounds. No appreciable masses or hepatomegaly. Rectal:  Declines Msk:  Symmetrical without gross deformities. Without edema, no deformity or joint abnormality.  Neurologic:  Alert and  oriented x4;  grossly normal neurologically.  Skin:   Dry and intact without significant lesions or rashes. Psychiatric: Oriented to person, place and time. Demonstrates good judgement and reason without abnormal affect or behaviors.  Physical Exam    RELEVANT LABS AND IMAGING: CBC    Component Value Date/Time   WBC 5.7 05/25/2023 0956   RBC 3.92 05/25/2023 0956   HGB 12.9 05/25/2023 0956   HCT 38.5 05/25/2023 0956   PLT 201.0 05/25/2023 0956   MCV 98.2 05/25/2023 0956   MCH 30.4 03/02/2012 0929   MCHC 33.4 05/25/2023 0956   RDW 14.5 05/25/2023 0956   LYMPHSABS 2.7 05/25/2023 0956   MONOABS 0.4 05/25/2023 0956   EOSABS 0.1 05/25/2023 0956   BASOSABS 0.0 05/25/2023 0956    CMP     Component Value Date/Time   NA 137 05/25/2023  0956   K 4.6 05/25/2023 0956   CL 103 05/25/2023 0956   CO2 26 05/25/2023 0956   GLUCOSE 90 05/25/2023 0956   GLUCOSE 92 03/27/2006 1110   BUN 9 05/25/2023 0956   CREATININE 0.59 05/25/2023 0956   CREATININE 0.68 10/24/2022 1459   CALCIUM 8.9 05/25/2023 0956   CALCIUM 9.9 07/01/2012 1121   PROT 6.9 05/25/2023 0956   ALBUMIN 4.1 05/25/2023 0956   AST 14 05/25/2023 0956   ALT 10 05/25/2023 0956   ALKPHOS 54 05/25/2023 0956   BILITOT 0.7 05/25/2023 0956   GFRNONAA >60 04/25/2010 1204   GFRAA  04/25/2010 1204    >60        The eGFR has been calculated using the MDRD equation. This calculation has not been validated in all clinical situations. eGFR's persistently <60 mL/min signify possible Chronic Kidney Disease.     Assessment/Plan:   Assessment and Plan Assessment & Plan     LUQ pain, food avoidance, nausea Negative colonoscopy 2023, EGD with chronic H. pylori negative gastritis 2023.  US  abdomen complete with hepatic steatosis and s/p cholecystectomy.  Normal CBC/CMP/TSH   Gigi Kyle [facility] 08/09/2023, 9:27 PM  Cc: Ezell Hollow, MD

## 2023-08-10 ENCOUNTER — Encounter: Payer: Self-pay | Admitting: Gastroenterology

## 2023-08-10 ENCOUNTER — Ambulatory Visit: Admitting: Gastroenterology

## 2023-08-10 VITALS — BP 120/60 | HR 76 | Ht <= 58 in | Wt 140.1 lb

## 2023-08-10 DIAGNOSIS — K2950 Unspecified chronic gastritis without bleeding: Secondary | ICD-10-CM

## 2023-08-10 DIAGNOSIS — R1011 Right upper quadrant pain: Secondary | ICD-10-CM | POA: Diagnosis not present

## 2023-08-10 DIAGNOSIS — K5900 Constipation, unspecified: Secondary | ICD-10-CM

## 2023-08-10 DIAGNOSIS — K760 Fatty (change of) liver, not elsewhere classified: Secondary | ICD-10-CM

## 2023-08-10 DIAGNOSIS — R1012 Left upper quadrant pain: Secondary | ICD-10-CM | POA: Diagnosis not present

## 2023-08-10 MED ORDER — PANTOPRAZOLE SODIUM 40 MG PO TBEC: 40.0000 mg | DELAYED_RELEASE_TABLET | Freq: Every day | ORAL | 3 refills | Status: DC

## 2023-08-10 NOTE — Addendum Note (Signed)
 Addended by: Jeanne Diefendorf D on: 08/10/2023 09:19 AM   Modules accepted: Orders

## 2023-08-10 NOTE — Patient Instructions (Addendum)
 Start taking Miralax 1 capful (17 grams) 1x / day for 1 week.   If this is not effective, increase to 1 dose 2x / day for 1 week.   If this is still not effective, increase to two capfuls (34 grams) 2x / day.   Can adjust dose as needed based on response. Can take 1/2 cap daily, skip days, or increase per day.    We have sent the following medications to your pharmacy for you to pick up at your convenience: Pantoprazole  Your provider has ordered "Diatherix" stool testing for you. You have received a kit from our office today containing all necessary supplies to complete this test. Please carefully read the stool collection instructions provided in the kit before opening the accompanying materials. In addition, be sure there is a label providing your full name and date of birth on the "puritan opti-swab" tube that is supplied in the kit (if you do not see a label with this information on your test tube, please make us  aware before test collection!). After completing the test, you should secure the purtian tube into the specimen biohazard bag. The [facility] E-Req sheet (including date and time of specimen collection) should be placed into the outside pocket of the specimen biohazard bag and returned to the [place] lab (basement floor of [facility]) within 3 days of collection. Please make sure to give the specimen to a staff member at the lab. DO NOT leave the specimen on the counter.   If the specimen date and time (can be found in the upper right boxed portion of the sheet) are not filled out on the E-Req sheet, the test will NOT be performed.   _______________________________________________________  If your blood pressure at your visit was 140/90 or greater, please contact your primary care physician to follow up on this.  _______________________________________________________  If you are age 67 or older, your body mass index should be between 23-30. Your Body  mass index is 30.86 kg/m. If this is out of the aforementioned range listed, please consider follow up with your Primary Care Provider.  If you are age 57 or younger, your body mass index should be between 19-25. Your Body mass index is 30.86 kg/m. If this is out of the aformentioned range listed, please consider follow up with your Primary Care Provider.   ________________________________________________________  The [facility] providers would like to encourage you to use MYCHART to communicate with providers for non-urgent requests or questions.  Due to long hold times on the telephone, sending your provider a message by [facility] may be a faster and more efficient way to get a response.  Please allow 48 business hours for a response.  Please remember that this is for non-urgent requests.  _______________________________________________________

## 2023-08-10 NOTE — Telephone Encounter (Signed)
 Referral placed.

## 2023-08-10 NOTE — Progress Notes (Signed)
 Agree with assessment and plan as outlined.  [place] this patient used to follow with Dr. Elvin Hammer in the clinic, may be better to reassign this patient back to him?

## 2023-08-10 NOTE — Telephone Encounter (Signed)
 Send a referral to Dr. Dania Dupre, psychiatry, she has been seen there before.  Dx anxiety, depression, insomnia

## 2023-08-11 ENCOUNTER — Ambulatory Visit: Admitting: Psychiatry

## 2023-08-11 NOTE — Telephone Encounter (Signed)
 Refer her to neurology, sleep disorder, insomnia.  Let her know.

## 2023-08-11 NOTE — Telephone Encounter (Signed)
 Referral placed.

## 2023-08-11 NOTE — Addendum Note (Signed)
 Addended by: Alyssamarie Mounsey D on: 08/11/2023 01:09 PM   Modules accepted: Orders

## 2023-08-14 ENCOUNTER — Telehealth: Payer: Self-pay | Admitting: Gastroenterology

## 2023-08-14 NOTE — Telephone Encounter (Signed)
 H. pylori Diatherix negative

## 2023-08-21 ENCOUNTER — Encounter: Payer: Self-pay | Admitting: Gastroenterology

## 2023-08-24 ENCOUNTER — Ambulatory Visit: Admitting: Psychiatry

## 2023-08-24 DIAGNOSIS — F411 Generalized anxiety disorder: Secondary | ICD-10-CM | POA: Diagnosis not present

## 2023-08-24 NOTE — Progress Notes (Signed)
 [facility] Counselor/Therapist Progress Note  Patient ID: Jamie Patrick, MRN: [id number],    Date: 08/24/2023  Time Spent: 53 minutes   Treatment Type: Individual Therapy  Reported Symptoms: anxiety, depression, some difficulty sleeping at times, some motivational challenges  Mental Status Exam:  Appearance:   Casual and Neat     Behavior:  Appropriate and Sharing  Motor:  Normal  Speech/Language:   Clear and Coherent  Affect:  Depressed and anxiety  Mood:  anxious and depressed  Thought process:  goal directed  Thought content:    WNL  Sensory/Perceptual disturbances:    WNL  Orientation:  oriented to person, place, time/date, situation, day of week, month of year, year, and stated date of Aug 24, 2023  Attention:  Fair  Concentration:  Good and Fair  Memory:  WNL And has some memory issues but "am better with things that are helpful and good for me"  Fund of knowledge:   Good  Insight:    Good  Judgment:   Good and Fair  Impulse Control:  Good and Fair   Risk Assessment: Danger to Self:  No Self-injurious Behavior: No Danger to Others: No Duty to Warn:no Physical Aggression / Violence:No  Access to Firearms a concern: No  Gang Involvement:No   Subjective:  Patient today showing some motivation and does actively participate in session today. Some bit of a language issue but patient stating "I don't want to switch therapists as I have felt comfortable here my first visit and today." We did notice that she is more easily understood when she speaks a bit more slowly and also speaks with a little more volume.  Good eye contact and seemed more motivated the more she talked. Sharing more of her past including when she was a child and "things she did to cope and how her behavior was different then from now."  Feels she is "fat" and after discussing this in more detail acknowledging she may have a few extra pounds to lose, but that she needs to do it in a healthy  manner, and not be influences by some negativity from others. (Not all details included in this note due to patient privacy needs.) Continues to worry about not sleeping, "and when I try Nyquil it doesn't really help". Frustrated that she can't sleep well. Some guilt issues with family and feeling she should do more to help them. Hard to let go of past but is clear that she wants to let to and focus now on the present and future. Husband has had cancer (leukemia, prostate cancer, skin cancer).States husband no longer has the prostate nor skin cancer due to surgeries. Talks of the past in challenging ways and feels she is ready to move forward and work on things in her present life, and work through some "personal and image issues, and also some anxiety and depression."  "I have a lot on my shoulders and need to manage things in healthier ways."  Commits to doing some journaling and will do this between now and next appt.   Interventions: Cognitive Behavioral Therapy, Solution-Oriented/Positive Psychology, and Ego-Supportive  1.  Identify life conflicts and/or situations including from the past and in the present, that support patient's current symptomology of anxiety/depression. 2.  Develop behavioral and cognitive strategies to reduce or eliminate excessive anxiety or depression.  Identify, challenge, and replace negative self talk with more positive, realistic, and empowering self talk. 3.  Patient will work on developing  reality based, positive cognitive messages that can help improve her mood and outlook while also working on building self-confidence and feeling better about herself.   Diagnosis:   ICD-10-CM   1. Generalized anxiety disorder  F41.1      Plan:  Patient in session today and talking almost non-stop at times, and "has felt unheard and not cared about." Reports feeling heard today and I asked that she let therapist know at any point if she is feeling unheard. Did well in voicing her  concerns today and willing to work on the challenges identified in session that are holding her back from moving forward in her life.  Working with patient to help her also recognize the positives and knowing that there are people in her life now that do care about her.  Did seem to feel more relieved by end of session and mood waited to follow through and doing her homework requested and which is related to her goals.  Goal review and progress/challenges noted with patient.  Next appointment within 2 to 3 weeks.   Kelleen Patee, LCSW

## 2023-08-25 NOTE — Progress Notes (Signed)
 Noted

## 2023-09-07 ENCOUNTER — Ambulatory Visit (INDEPENDENT_AMBULATORY_CARE_PROVIDER_SITE_OTHER): Admitting: Psychiatry

## 2023-09-07 DIAGNOSIS — F411 Generalized anxiety disorder: Secondary | ICD-10-CM

## 2023-09-07 NOTE — Progress Notes (Signed)
 [facility] Counselor/Therapist Progress Note  Patient ID: Jamie Patrick, MRN: [id number],    Date: 09/07/2023  Time Spent: 50 minutes   Treatment Type: Individual Therapy  Reported Symptoms: anxiety, depression, some difficulty sleeping but improving, some motivational challenges   Mental Status Exam:  Appearance:   Casual and Neat     Behavior:  Appropriate, Sharing, and Motivated  Motor:  Normal  Speech/Language:   Clear and Coherent  Affect:  anxious  Mood:  anxious and depressed  Thought process:  goal directed  Thought content:    WNL  Sensory/Perceptual disturbances:    WNL  Orientation:  oriented to person, place, time/date, situation, day of week, month of year, year, and stated date of September 07, 2023  Attention:  Good  Concentration:  Good  Memory:  WNL  Fund of knowledge:   Good  Insight:    Good and Fair  Judgment:   Good and Fair  Impulse Control:  Good   Risk Assessment: Danger to Self:  No Self-injurious Behavior: No Danger to Others: No Duty to Warn:no Physical Aggression / Violence:No  Access to Firearms a concern: No  Gang Involvement:No   Subjective:  Patient motivated and participating well in session, less challenges with language barrier and reports that she is feeling accepted and cared about here so doesn't want to switch.  States she is concerned about some memory issues and wants to be tested. Understands she can talk with and ask about that with her Primary Care Dr. Neomi Banks. Worries about her husband and "any health issues that he might have, and he sees Dr. Neomi Banks also".  Motivated and good eye contact today. Talking further about her negative thoughts/view of herself "but I know everybody can't  be a Barbie". "Used to get negative comments from other s because of her being overweight some" and not happening now.  Working further on this today in "accepting myself", eat heathy, and not be so self-critical as this is a work in progress. "What  worries me the most is my memory issues", which she is planning to schedule an appt soon. Husband's cancer remains in remission and goes for "leukemia". Had surgery and moved beyond prostate and skin cancer. Brother turning 66 yrs old soon and patient and husband plan to visit brother. Working further on taking care of her own health, feelings within family, and her own personal well-being. Excited about upcoming family contact to happen in early July. Plans to be with family at least 2 weeks. Feels she is continuing to move forward in some way as she works on her goals and remains motivated to keep working on the challenges she encounters in present days. Brought in written "homework" today which helped our talking together.   Interventions: Cognitive Behavioral Therapy, Ego-Supportive, and Insight-Oriented 1.  Identify life conflicts and/or situations including from the past and in the present, that support patient's current symptomology of anxiety/depression. 2.  Develop behavioral and cognitive strategies to reduce or eliminate excessive anxiety or depression.  Identify, challenge, and replace negative self talk with more positive, realistic, and empowering self talk. 3.  Patient will work on developing reality based, positive cognitive messages that can help improve her mood and outlook while also working on building self-confidence and feeling better about herself.  Diagnosis:   ICD-10-CM   1. Generalized anxiety disorder  F41.1      Plan: Patient today again talking almost not-stop and feeling good about the work she  is doing and her maintaining family contacts, all also working on her anxiety and depression.  Is concerned about some memory issues and plans to reach out to her doctor about appointment. Trying to push forward and not "feel held back by prior relationship issues within family, losses, and feeling her more recent strength, feeling that others do care about her." Feeling some "relief"  and very focused on upcoming family visit early July.  Noticing some positive differences in her life now versus in the past and that is a comfort to patient, along with her concerns about memory as noted above and states that she will be reaching out to her doctor this week for an appointment to talk with him further.  Goal review and progress/challenges noted with patient.  Next appointment within 2 to 3 weeks.   Kelleen Patee, LCSW

## 2023-09-15 ENCOUNTER — Encounter: Payer: Self-pay | Admitting: Internal Medicine

## 2023-09-15 ENCOUNTER — Ambulatory Visit (INDEPENDENT_AMBULATORY_CARE_PROVIDER_SITE_OTHER): Admitting: Internal Medicine

## 2023-09-15 VITALS — BP 128/62 | HR 79 | Temp 98.3°F | Resp 16 | Ht <= 58 in | Wt 141.5 lb

## 2023-09-15 DIAGNOSIS — F32A Depression, unspecified: Secondary | ICD-10-CM

## 2023-09-15 DIAGNOSIS — F450 Somatization disorder: Secondary | ICD-10-CM | POA: Diagnosis not present

## 2023-09-15 DIAGNOSIS — G4700 Insomnia, unspecified: Secondary | ICD-10-CM

## 2023-09-15 DIAGNOSIS — F419 Anxiety disorder, unspecified: Secondary | ICD-10-CM

## 2023-09-15 MED ORDER — BUPROPION HCL 100 MG PO TABS: 100.0000 mg | ORAL_TABLET | Freq: Two times a day (BID) | ORAL | 1 refills | Status: DC

## 2023-09-15 NOTE — Patient Instructions (Addendum)
 Continue Lexapro  20 mg once every day  Restart Wellbutrin 100 mg: 1 tablet a day for the first week, then 1 tablet twice daily. This medication has worked well for you before.  Continue seeing a counselor at [facility] They also have providers (MDs or nurse practitioners) that can prescribe medication for you.  You need to get established with one of them.  If you get worse, you have more intense suicidal ideas: Seek help immediately.  You can go to the emergency room of any hospital.  You have also available the Behavioral health urgent care for [facility] [phone] Address [address]. [place], [place] [address] Hours Open 24/7. No appointment required.    Follow-up in 4 months

## 2023-09-15 NOTE — Progress Notes (Unsigned)
 Subjective:    Patient ID: Jamie Patrick, female    DOB: June 20, 1955, 68 y.o.   MRN: [id number]  DOS:  09/15/2023 Type of visit - description: Acute, here with her husband  Her main concern is actually anxiety, depression and insomnia. She also feels fatigued  Review of Systems See above   Past Medical History:  Diagnosis Date   Allergy    Anal fissure    Anemia, unspecified    Anxiety    Colon polyps    BENIGN   CTS (carpal tunnel syndrome)    question of   Depression    Dysplasia of cervix, low grade (CIN 1)    h/o CIN-1/CIN2   History of Helicobacter pylori infection    Hypothyroidism    Osteopenia    as reported by pt, DEXA @ gyn   Pneumonia    Prediabetes 09/01/2012   Vitamin D  deficiency     Past Surgical History:  Procedure Laterality Date   APPENDECTOMY     CARPAL TUNNEL RELEASE Bilateral 09/11/2017   CHOLECYSTECTOMY  2010   CRYOTHERAPY     FOR CERVICAL DYSPLASIA CIN-1/CIN-2   TONSILLECTOMY     uterine polyps  2003   Dr.Fernadez/ RESECTOSCOPIC POLYPECTOMY    Current Outpatient Medications  Medication Instructions   buPROPion (WELLBUTRIN) 100 mg, Oral, 2 times daily   Calcium Carbonate-Vitamin D  (CALCIUM + D PO) 1 tablet, Daily   escitalopram  (LEXAPRO ) 20 mg, Oral, Daily   estradiol  (ESTRACE ) 1 mg, Daily   fexofenadine (ALLEGRA) 180 mg, Daily   fish oil-omega-3 fatty acids 2 g, Daily   levothyroxine  (SYNTHROID ) 50 mcg, Oral, Daily before breakfast   MAGNESIUM PO 275 mg   medroxyPROGESTERone  (PROVERA ) 2.5 mg, Daily   pantoprazole  (PROTONIX ) 40 mg, Oral, Daily       Objective:   Physical Exam BP 128/62   Pulse 79   Temp 98.3 F (36.8 C) (Oral)   Resp 16   Ht 4' 8.5 (1.435 m)   Wt 141 lb 8 oz (64.2 kg)   LMP 02/03/2009   SpO2 96%   BMI 31.16 kg/m  General:   Well developed, NAD, BMI noted. HEENT:  Normocephalic . Face symmetric, atraumatic Skin: Not pale. Not jaundice Neurologic:  alert & oriented X3.  Speech normal, gait  appropriate for age and unassisted Psych--  Cognition and judgment appear intact.  Cooperative with normal attention span and concentration.  Behavior appropriate. Tearful at times    Assessment   Assessment  Prediabetes Hypothyroidism Anxiety , depression , insomnia, as of 10/2022 Dr Dania Dupre intolerant to Abilify,Viibrid, used to see Dr Deborra Falter trazadone (didn't work), elavil  (stopped working), ambien  (++s/e). 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  C-scope 12/2021 WNL.  PLAN: Anxiety, depression, insomnia: He has a long history of these problems, has failed a number of medications. She reports some suicidal ideas however she has no plans and states she will not do it. I asked her to put in perspective that she has chronic anxiety, depression.  Chronic fatigue.  Chronic LUQ abdominal pain without explanation.  I think her symptoms point to a functional problem rather than a physical one; she states she understand and agrees with me. Plan:  Continue seeing counselor Continue Lexapro  She must get established with a psychiatrist  for further pharmacological management.  In the meantime we will restart Wellbutrin. Her husband is present today, the 3 of us  agreed that they will keep a close eye on suicidal thoughts and if needed they will seek help immediately, see AVS.  Epworth scale elevated slightly, reassess on RTC Somatization?  See above RTC 4 months.

## 2023-09-16 NOTE — Assessment & Plan Note (Signed)
 Anxiety, depression, insomnia: He has a long history of these problems, has failed a number of medications. She reports some suicidal ideas however she has no plans and states she will not do it. I asked her to put in perspective that she has chronic anxiety, depression.  Chronic fatigue.  Chronic LUQ abdominal pain without explanation.  I think her symptoms point to a functional problem rather than a physical one; she states she understand and agrees with me. Plan:  Continue seeing counselor Continue Lexapro  She must get established with a psychiatrist for further pharmacological management.  In the meantime we will restart Wellbutrin. Her husband is present today, the 3 of us  agreed that they will keep a close eye on suicidal thoughts and if needed they will seek help immediately, see AVS.  Epworth scale elevated slightly, reassess on RTC Somatization?  See above RTC 4 months.

## 2023-09-18 DIAGNOSIS — R311 Benign essential microscopic hematuria: Secondary | ICD-10-CM | POA: Diagnosis not present

## 2023-09-18 DIAGNOSIS — B3731 Acute candidiasis of vulva and vagina: Secondary | ICD-10-CM | POA: Diagnosis not present

## 2023-09-18 DIAGNOSIS — Z8742 Personal history of other diseases of the female genital tract: Secondary | ICD-10-CM | POA: Diagnosis not present

## 2023-09-18 DIAGNOSIS — N952 Postmenopausal atrophic vaginitis: Secondary | ICD-10-CM | POA: Diagnosis not present

## 2023-09-21 ENCOUNTER — Ambulatory Visit: Admitting: Psychiatry

## 2023-09-21 DIAGNOSIS — F411 Generalized anxiety disorder: Secondary | ICD-10-CM | POA: Diagnosis not present

## 2023-09-21 NOTE — Progress Notes (Signed)
 [facility] Counselor/Therapist Progress Note  Patient ID: Jamie Patrick, MRN: [id number],    Date: 09/21/2023  Time Spent: 50 minutes   Treatment Type: Individual Therapy  Reported Symptoms: anxiety, depression, can't remember past 1 day and is very concerned and plans to talk with her medical Dr about a referral to a neurologist for testing for significant memory issues, sleep not good but improves sometimes, some motivational challenges; more concerned about her memory and states she plans to check on getting evaluated for her sleep issues.    Mental Status Exam:  Appearance:   Casual and Neat     Behavior:  Appropriate, Sharing, and Motivated  Motor:  Normal  Speech/Language:   Clear and Coherent and but does have an accent  Affect:  anxious  Mood:  anxious  Thought process:  goal directed  Thought content:    Some ruminating  Sensory/Perceptual disturbances:    WNL  Orientation:  oriented to person, place, situation, day of week, month of year, year, and did not know exact date in June  Attention:  Fair per patient  Concentration:  Fair and Poor per patient  Memory:  Memory is bad  Fund of knowledge:   Fair and Poor  Insight:    Fair  Judgment:   Good and Fair  Impulse Control:  Good   Risk Assessment: Danger to Self:  No Self-injurious Behavior: No Danger to Others: No Duty to Warn:no Physical Aggression / Violence:No  Access to Firearms a concern: No  Gang Involvement:No   Subjective:    Patient continuing to be motivated and talking well in session and today is worried about her focusing so much on her memory issues which she feels is her main concern and also her lack of consistent sleep. Plans to try and get a referral from her medical doctor to get an evaluation from a neurologist for her memory concerns. Will follow up in individual therapy here at our office and can see a med provider here regarding sleep issues.  Worries a lot about  herself and husband and others.  Is planning a trip to see her brother in July who turns 59 years old in [place].  Patient needed most of session today to vent and process a lot of her overwhelmingness, which she feels are her memory issues, sleep difficulties, and heightened anxiety.  Was much calmer by the end of session and is being lined up to see a med provider here regarding possible medication that might help her sleep difficulties.  I shared with her that she would need to check with her primary care doctor if she is wanting to get a referral for a neurologist to see patient regarding memory issues that she reports are getting worse.  Seem to feel calmer and more self-assured by end of session today.  Will see again when she returns from her trip to [place] with husband.  Interventions: Cognitive Behavioral Therapy and Ego-Supportive 1. Identify life conflicts and/or situations including from the past and in the present, that support patient's current symptomology of anxiety/depression. 2.  Develop behavioral and cognitive strategies to reduce or eliminate excessive anxiety or depression.  Identify, challenge, and replace negative self talk with more positive, realistic, and empowering self talk. 3.  Patient will work on developing reality based, positive cognitive messages that can help improve her mood and outlook while also working on building self-confidence and feeling better about herself.   Diagnosis:   ICD-10-CM  1. Generalized anxiety disorder  F41.1      Plan:    Patient motivated and participating actively in session today working further on her relationship with other family members, and also continuing to work on her anxiety and depression.  As noted above she is concerned about her ongoing memory issues getting worse and her anxiety and depression which we will be working on together in therapy.  She also plans to see a med provider here at our office regarding her difficulty sleeping  and to help determine if any sleep medication is needed for patient.  Will see again upon returning from her trip to see brother and progress.  Goal review and progress/challenges noted with patient.  Next appointment within 2 to 3 weeks.   Kelleen Patee, LCSW

## 2023-09-25 ENCOUNTER — Encounter: Payer: Self-pay | Admitting: Internal Medicine

## 2023-09-28 NOTE — Progress Notes (Deleted)
 Chief Complaint: follow up Primary GI MD:Dr. Abran  HPI:  *** is a  ***  who was referred to me by Amon Aloysius BRAVO, MD for a complaint of *** .     Discussed the use of AI scribe software for clinical note transcription with the patient, who gave verbal consent to proceed.  History of Present Illness      PREVIOUS GI WORKUP   US  abdomen complete 05/25/2023 - Hepatic steatosis - Surgically absent gallbladder without biliary dilation   EGD 07/2021 - Esophageal mucosa normal - Moderate erythema in the whole stomach with multiple biopsies taken - Duodenum normal - Gastric biopsy with chronic gastritis, negative H. Pylori   Colonoscopy 09/13/2021 - Terminal ileum normal - Normal examined segments of colon normal - Medium internal hemorrhoids - Repeat 10 years  Past Medical History:  Diagnosis Date   Allergy    Anal fissure    Anemia, unspecified    Anxiety    Colon polyps    BENIGN   CTS (carpal tunnel syndrome)    question of   Depression    Dysplasia of cervix, low grade (CIN 1)    h/o CIN-1/CIN2   History of Helicobacter pylori infection    Hypothyroidism    Osteopenia    as reported by pt, DEXA @ gyn   Pneumonia    Prediabetes 09/01/2012   Vitamin D  deficiency     Past Surgical History:  Procedure Laterality Date   APPENDECTOMY     CARPAL TUNNEL RELEASE Bilateral 09/11/2017   CHOLECYSTECTOMY  2010   CRYOTHERAPY     FOR CERVICAL DYSPLASIA CIN-1/CIN-2   TONSILLECTOMY     uterine polyps  2003   Dr.Fernadez/ RESECTOSCOPIC POLYPECTOMY    Current Outpatient Medications  Medication Sig Dispense Refill   buPROPion  (WELLBUTRIN ) 100 MG tablet Take 1 tablet (100 mg total) by mouth 2 (two) times daily. 60 tablet 1   Calcium Carbonate-Vitamin D  (CALCIUM + D PO) Take 1 tablet by mouth daily.      escitalopram  (LEXAPRO ) 20 MG tablet Take 1 tablet (20 mg total) by mouth daily. 90 tablet 1   estradiol  (ESTRACE ) 1 MG tablet Take 1 mg by mouth daily.     fexofenadine  (ALLEGRA) 180 MG tablet Take 180 mg by mouth daily.     fish oil-omega-3 fatty acids 1000 MG capsule Take 2 g by mouth daily.     levothyroxine  (SYNTHROID ) 50 MCG tablet Take 1 tablet (50 mcg total) by mouth daily before breakfast. 90 tablet 1   MAGNESIUM PO Take 275 mg by mouth.     medroxyPROGESTERone  (PROVERA ) 2.5 MG tablet Take 2.5 mg by mouth daily.     pantoprazole  (PROTONIX ) 40 MG tablet Take 1 tablet (40 mg total) by mouth daily. 30 tablet 3   No current facility-administered medications for this visit.    Allergies as of 09/29/2023 - Review Complete 09/15/2023  Allergen Reaction Noted   Suvorexant  Other (See Comments) 05/05/2017   Zolpidem  Other (See Comments) 05/05/2017   Ceftriaxone sodium  07/14/2008   Cephalosporins Nausea And Vomiting    Codeine  Other (See Comments) 03/14/2017   Sulfonamide derivatives  05/08/2007    Family History  Problem Relation Age of Onset   Hypertension Mother    Obesity Mother    Dementia Father        died at age 57   Osteoporosis Sister    Osteoporosis Sister    Diabetes Sister    Diabetes Brother  uncles, GM   Hypertension Brother    Breast cancer Other        distant cousins   Kidney disease Niece    Heart attack Neg Hx    Colon cancer Neg Hx     Social History   Socioeconomic History   Marital status: Married    Spouse name: Not on file   Number of children: 0   Years of education: Not on file   Highest education level: Bachelor's degree (e.g., BA, AB, BS)  Occupational History   Occupation: currently not working  Tobacco Use   Smoking status: Never   Smokeless tobacco: Never  Vaping Use   Vaping status: Never Used  Substance and Sexual Activity   Alcohol use: No    Comment:     Drug use: No   Sexual activity: Never  Other Topics Concern   Not on file  Social History Narrative   Original from [place]   Household- pt and husband    Social Drivers of Corporate investment banker Strain: Low Risk   (05/23/2023)   Overall Financial Resource Strain (CARDIA)    Difficulty of Paying Living Expenses: Not hard at all  Food Insecurity: No Food Insecurity (05/23/2023)   Hunger Vital Sign    Worried About Running Out of Food in the Last Year: Never true    Ran Out of Food in the Last Year: Never true  Transportation Needs: No Transportation Needs (05/23/2023)   PRAPARE - Administrator, Civil Service (Medical): No    Lack of Transportation (Non-Medical): No  Physical Activity: Insufficiently Active (05/23/2023)   Exercise Vital Sign    Days of Exercise per Week: 2 days    Minutes of Exercise per Session: 20 min  Stress: Stress Concern Present (05/23/2023)   [employer] of Occupational Health - Occupational Stress Questionnaire    Feeling of Stress : To some extent  Social Connections: Socially Integrated (05/23/2023)   Social Connection and Isolation Panel    Frequency of Communication with Friends and Family: More than three times a week    Frequency of Social Gatherings with Friends and Family: Once a week    Attends Religious Services: More than 4 times per year    Active Member of [employer] or Organizations: Yes    Attends Engineer, structural: More than 4 times per year    Marital Status: Married  Catering manager Violence: Not At Risk (01/14/2023)   Humiliation, Afraid, Rape, and Kick questionnaire    Fear of Current or Ex-Partner: No    Emotionally Abused: No    Physically Abused: No    Sexually Abused: No    Review of Systems:    Constitutional: No weight loss, fever, chills, weakness or fatigue HEENT: Eyes: No change in vision               Ears, Nose, Throat:  No change in hearing or congestion Skin: No rash or itching Cardiovascular: No chest pain, chest pressure or palpitations   Respiratory: No SOB or cough Gastrointestinal: See HPI and otherwise negative Genitourinary: No dysuria or change in urinary frequency Neurological: No headache, dizziness  or syncope Musculoskeletal: No new muscle or joint pain Hematologic: No bleeding or bruising Psychiatric: No history of depression or anxiety    Physical Exam:  Vital signs: LMP 02/03/2009   Constitutional: NAD, alert and cooperative Head:  Normocephalic and atraumatic. Eyes:   PEERL, EOMI. No icterus. Conjunctiva pink. Respiratory: Respirations  even and unlabored. Lungs clear to auscultation bilaterally.   No wheezes, crackles, or rhonchi.  Cardiovascular:  Regular rate and rhythm. No peripheral edema, cyanosis or pallor.  Gastrointestinal:  Soft, nondistended, nontender. No rebound or guarding. Normal bowel sounds. No appreciable masses or hepatomegaly. Rectal:  Declines Msk:  Symmetrical without gross deformities. Without edema, no deformity or joint abnormality.  Neurologic:  Alert and  oriented x4;  grossly normal neurologically.  Skin:   Dry and intact without significant lesions or rashes. Psychiatric: Oriented to person, place and time. Demonstrates good judgement and reason without abnormal affect or behaviors.  Physical Exam    RELEVANT LABS AND IMAGING: CBC    Component Value Date/Time   WBC 5.7 05/25/2023 0956   RBC 3.92 05/25/2023 0956   HGB 12.9 05/25/2023 0956   HCT 38.5 05/25/2023 0956   PLT 201.0 05/25/2023 0956   MCV 98.2 05/25/2023 0956   MCH 30.4 03/02/2012 0929   MCHC 33.4 05/25/2023 0956   RDW 14.5 05/25/2023 0956   LYMPHSABS 2.7 05/25/2023 0956   MONOABS 0.4 05/25/2023 0956   EOSABS 0.1 05/25/2023 0956   BASOSABS 0.0 05/25/2023 0956    CMP     Component Value Date/Time   NA 137 05/25/2023 0956   K 4.6 05/25/2023 0956   CL 103 05/25/2023 0956   CO2 26 05/25/2023 0956   GLUCOSE 90 05/25/2023 0956   GLUCOSE 92 03/27/2006 1110   BUN 9 05/25/2023 0956   CREATININE 0.59 05/25/2023 0956   CREATININE 0.68 10/24/2022 1459   CALCIUM 8.9 05/25/2023 0956   CALCIUM 9.9 07/01/2012 1121   PROT 6.9 05/25/2023 0956   ALBUMIN 4.1 05/25/2023 0956   AST  14 05/25/2023 0956   ALT 10 05/25/2023 0956   ALKPHOS 54 05/25/2023 0956   BILITOT 0.7 05/25/2023 0956   GFRNONAA >60 04/25/2010 1204   GFRAA  04/25/2010 1204    >60        The eGFR has been calculated using the MDRD equation. This calculation has not been validated in all clinical situations. eGFR's persistently <60 mL/min signify possible Chronic Kidney Disease.     Assessment/Plan:   Assessment and Plan Assessment & Plan    LUQ pain/RUQ pain Gastritis Negative colonoscopy 2023, EGD with chronic H. pylori negative gastritis 2023.  US  abdomen complete with hepatic steatosis and s/p cholecystectomy.  Normal CBC/CMP/TSH. Negative h pylori stool antigen Worse at night and with coffee/chocolate. Suspect her pain could be gastritis versus constipation with extensive previous negative workup. Given pantoprazole  40mg  once daily - Recheck H. pylori Diatherix since it has been since 2023 - Trial of pantoprazole  40 Mg once daily - Educated patient on lifestyle modifications and provided patient education handouts - Follow-up 6 to 8 weeks, if no improvement on pantoprazole  could consider CT scan for further evaluation   Constipation Intermittent constipation with sporadic use of stool softeners. - MiraLAX 1 capful daily adjust dose based on response - Increase water, increase fiber, increase exercise   Hepatic steatosis Normal LFTs.  Continue to monitor.  Discussed importance of diet and exercise as treatment for hepatic steatosis.  Nestor Mollie RIGGERS [facility] 09/28/2023, 12:32 PM  Cc: Amon Aloysius BRAVO, MD

## 2023-09-29 ENCOUNTER — Ambulatory Visit: Admitting: Gastroenterology

## 2023-09-29 DIAGNOSIS — L811 Chloasma: Secondary | ICD-10-CM | POA: Diagnosis not present

## 2023-09-30 ENCOUNTER — Ambulatory Visit: Admitting: Psychiatry

## 2023-10-21 ENCOUNTER — Ambulatory Visit: Admitting: Behavioral Health

## 2023-10-26 ENCOUNTER — Ambulatory Visit (INDEPENDENT_AMBULATORY_CARE_PROVIDER_SITE_OTHER): Payer: Self-pay | Admitting: Psychiatry

## 2023-10-26 DIAGNOSIS — F411 Generalized anxiety disorder: Secondary | ICD-10-CM

## 2023-10-26 NOTE — Progress Notes (Signed)
       Patient no showed for appointment today.  No-show fee is $50.

## 2023-12-02 ENCOUNTER — Telehealth: Payer: Self-pay

## 2023-12-02 DIAGNOSIS — Z0000 Encounter for general adult medical examination without abnormal findings: Secondary | ICD-10-CM

## 2023-12-02 DIAGNOSIS — R739 Hyperglycemia, unspecified: Secondary | ICD-10-CM

## 2023-12-02 DIAGNOSIS — M8588 Other specified disorders of bone density and structure, other site: Secondary | ICD-10-CM

## 2023-12-02 DIAGNOSIS — E039 Hypothyroidism, unspecified: Secondary | ICD-10-CM

## 2023-12-02 NOTE — Telephone Encounter (Signed)
 BMP FLP TSH  vitamin D    A1c

## 2023-12-02 NOTE — Telephone Encounter (Signed)
 Copied from CRM [id number]. Topic: Clinical - Request for Lab/Test Order >> Dec 02, 2023  4:39 PM Viola F wrote: Reason for CRM: Patient needs lab order put in for physical - lab appt scheduled 02/17/24

## 2023-12-02 NOTE — Telephone Encounter (Signed)
**Note De-identified  [place] Obfuscation** Please advise 

## 2023-12-03 NOTE — Telephone Encounter (Signed)
 Orders placed.

## 2024-01-18 ENCOUNTER — Encounter: Payer: Medicare Other | Admitting: Internal Medicine

## 2024-02-13 NOTE — Progress Notes (Addendum)
 [facility] [address], [address] [place], [place] [address] [phone]  Date:  02/15/2024   Name:  Jamie Patrick   DOB:  [birth year]   MRN:  [id number]  PCP:  Amon Aloysius BRAVO, MD    Chief Complaint: No chief complaint on file.   History of Present Illness:  Jamie Patrick Jamie Patrick is a 68 y.o. very pleasant female patient who presents with the following:  Primary patient of my partner Dr. Amon seen today with concern of hair loss-I have not seen her myself previously She does have history of hypothyroidism and is currently taking levothyroxine  50 mcg  Lab Results  Component Value Date   TSH 1.51 10/24/2022   -Flu shot  Patient Active Problem List   Diagnosis Date Noted   Generalized anxiety disorder 08/03/2023   History of Helicobacter pylori infection 10/23/2022   Closed fracture of left radius 10/23/2022   Anemia, unspecified 10/23/2022   Fracture of fifth metatarsal bone 10/23/2022   Mixed stress and urge urinary incontinence 06/23/2018   Trigger finger of right thumb 01/30/2017   Carpal tunnel syndrome on both sides 02/15/2016   Trigger middle finger of left hand 02/15/2016   Follow-up-----------------PCP NOTES 12/15/2014   Memory deficit ? 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   Hypothyroidism 02/03/2007   Anxiety and depression 10/28/2006    Past Medical History:  Diagnosis Date   Allergy    Anal fissure    Anemia, unspecified    Anxiety    Colon polyps    BENIGN   CTS (carpal tunnel syndrome)    question of   Depression    Dysplasia of cervix, low grade (CIN 1)    h/o CIN-1/CIN2   History of Helicobacter  pylori infection    Hypothyroidism    Osteopenia    as reported by pt, DEXA @ gyn   Pneumonia    Prediabetes 09/01/2012   Vitamin D  deficiency     Past Surgical History:  Procedure Laterality Date   APPENDECTOMY     CARPAL TUNNEL RELEASE Bilateral 09/11/2017   CHOLECYSTECTOMY  2010   CRYOTHERAPY     FOR CERVICAL DYSPLASIA CIN-1/CIN-2   TONSILLECTOMY     uterine polyps  2003   Dr.Fernadez/ RESECTOSCOPIC POLYPECTOMY    Social History   Tobacco Use   Smoking status: Never   Smokeless tobacco: Never  Vaping Use   Vaping status: Never Used  Substance Use Topics   Alcohol use: No    Comment:     Drug use: No    Family History  Problem Relation Age of Onset   Hypertension Mother    Obesity Mother    Dementia Father        died at age 93   Osteoporosis Sister    Osteoporosis Sister    Diabetes Sister    Diabetes Brother        uncles, GM   Hypertension Brother    Breast cancer Other        distant cousins   Kidney disease Niece    Heart attack Neg Hx    Colon cancer Neg Hx     Allergies  Allergen Reactions   Suvorexant  Other (See Comments)    Nightmares   Zolpidem  Other (See Comments)    Nightmares   Ceftriaxone Sodium     REACTION: nausea   Cephalosporins Nausea And Vomiting   Codeine  Other (See Comments)    Pt states she was hallucinating and talking out of her head.    Sulfonamide Derivatives     REACTION: near syncope, N\T\V    Medication list has been reviewed and updated.  Current Outpatient Medications on File Prior to Visit  Medication Sig Dispense Refill   buPROPion  (WELLBUTRIN ) 100 MG tablet Take 1 tablet (100 mg total) by mouth 2 (two) times daily. 60 tablet 1   Calcium Carbonate-Vitamin D  (CALCIUM + D PO) Take 1 tablet by mouth daily.      escitalopram  (LEXAPRO ) 20 MG tablet Take 1 tablet (20 mg total) by mouth daily. 90 tablet 1   estradiol  (ESTRACE ) 1 MG tablet Take 1 mg by mouth daily.     fexofenadine (ALLEGRA) 180 MG tablet Take 180  mg by mouth daily.     fish oil-omega-3 fatty acids 1000 MG capsule Take 2 g by mouth daily.     levothyroxine  (SYNTHROID ) 50 MCG tablet Take 1 tablet (50 mcg total) by mouth daily before breakfast. 90 tablet 1   MAGNESIUM PO Take 275 mg by mouth.     medroxyPROGESTERone  (PROVERA ) 2.5 MG tablet Take 2.5 mg by mouth daily.     pantoprazole  (PROTONIX ) 40 MG tablet Take 1 tablet (40 mg total) by mouth daily. 30 tablet 3   No current facility-administered medications on file prior to visit.    Review of Systems:  As per HPI- otherwise negative.   Physical Examination: There were no vitals filed for this visit. There were no vitals filed for this visit. There is no height or weight on file to calculate BMI. Ideal Body Weight:    GEN: no acute distress. HEENT: Atraumatic, Normocephalic.  Ears and Nose: No external deformity. CV: RRR, No M/G/R. No JVD. No thrill. No extra heart sounds. PULM: CTA B, no wheezes, crackles, rhonchi. No retractions. No resp. distress. No accessory muscle use. ABD: S, NT, ND, +BS. No rebound. No HSM. EXTR: No c/c/e PSYCH: Normally interactive. Conversant.    Assessment and Plan: No diagnosis found.  Assessment & Plan   Signed Harlene Schroeder, MD

## 2024-02-13 NOTE — Patient Instructions (Incomplete)
 It was good to see you today Flu shot given I changed your medications over to forms available in the [place]  as much as possible Referral to endocrinology about your thyroid  and neurology about your memory pending

## 2024-02-15 ENCOUNTER — Encounter: Payer: Self-pay | Admitting: Family Medicine

## 2024-02-15 ENCOUNTER — Ambulatory Visit (INDEPENDENT_AMBULATORY_CARE_PROVIDER_SITE_OTHER): Admitting: Family Medicine

## 2024-02-15 VITALS — BP 110/68 | HR 75 | Ht <= 58 in

## 2024-02-15 DIAGNOSIS — F419 Anxiety disorder, unspecified: Secondary | ICD-10-CM

## 2024-02-15 DIAGNOSIS — R413 Other amnesia: Secondary | ICD-10-CM

## 2024-02-15 DIAGNOSIS — F32A Depression, unspecified: Secondary | ICD-10-CM

## 2024-02-15 DIAGNOSIS — Z23 Encounter for immunization: Secondary | ICD-10-CM | POA: Diagnosis not present

## 2024-02-15 DIAGNOSIS — E039 Hypothyroidism, unspecified: Secondary | ICD-10-CM

## 2024-02-15 MED ORDER — MEMANTINE HCL 10 MG PO TABS: 10.0000 mg | ORAL_TABLET | Freq: Every day | ORAL | 3 refills | Status: DC

## 2024-02-15 MED ORDER — MIRTAZAPINE 7.5 MG PO TABS: 7.5000 mg | ORAL_TABLET | Freq: Every day | ORAL | 3 refills | Status: DC

## 2024-02-16 ENCOUNTER — Encounter: Payer: Self-pay | Admitting: Family Medicine

## 2024-02-16 LAB — TSH: TSH: <0.01 u[IU]/mL — ABNORMAL LOW (ref 0.35–5.50)

## 2024-02-16 MED ORDER — LEVOTHYROXINE SODIUM 25 MCG PO TABS
25.0000 ug | ORAL_TABLET | Freq: Every day | ORAL | 3 refills | Status: AC
Start: 2024-02-16 — End: ?

## 2024-02-16 NOTE — Addendum Note (Signed)
 Addended by: WATT RAISIN C on: 02/16/2024 01:05 PM   Modules accepted: Orders

## 2024-02-17 ENCOUNTER — Other Ambulatory Visit

## 2024-02-19 ENCOUNTER — Telehealth: Payer: Self-pay | Admitting: Internal Medicine

## 2024-02-19 NOTE — Telephone Encounter (Signed)
 Copied from CRM #[id number]. Topic: Medicare AWV >> Feb 19, 2024  2:25 PM Nathanel DEL wrote: Called LVM 02/19/2024 to change appts to office visits. Please sched in blocked 1120am appt 03/23/2024. Mychart message sent  Nathanel Paschal; Care Guide Ambulatory Clinical Support [facility] Direct Dial: [phone]

## 2024-02-22 ENCOUNTER — Encounter: Admitting: Internal Medicine

## 2024-03-11 DIAGNOSIS — H5203 Hypermetropia, bilateral: Secondary | ICD-10-CM | POA: Diagnosis not present

## 2024-03-13 DIAGNOSIS — R0981 Nasal congestion: Secondary | ICD-10-CM | POA: Diagnosis not present

## 2024-03-13 DIAGNOSIS — J3489 Other specified disorders of nose and nasal sinuses: Secondary | ICD-10-CM | POA: Diagnosis not present

## 2024-03-13 DIAGNOSIS — J019 Acute sinusitis, unspecified: Secondary | ICD-10-CM | POA: Diagnosis not present

## 2024-03-20 NOTE — Progress Notes (Unsigned)
 [facility] at [facility] [address] [place], [place] [address] [phone]  Date:  03/23/2024   Name:  Jamie Patrick   DOB:  [birth year]   MRN:  [id number]  PCP:  Amon Aloysius BRAVO, MD    Chief Complaint: No chief complaint on file.   History of Present Illness:  Jamie Patrick is a 68 y.o. very pleasant female patient who presents with the following:  Primary patient my partner Dr. Amon seen today with concern regarding medications and thyroid  treatment I did see her myself about a month ago for her hypothyroidism-note from that visit follows: She recently returned from an extended stay in [place], initially planned for ten days but prolonged due to health concerns. During her time there, she was evaluated by multiple specialists, including a psychologist, neurologist, psychiatrist, and endocrinologist, and was diagnosed with Hashimoto's thyroiditis after various tests. She has been on levothyroxine  since 2008 for her thyroid  condition. Recently, she has experienced increased hair loss, which began about a week ago. She reports increased hair loss, which began about a week ago, and has read that hair loss can be a symptom of Hashimoto's. In addition to levothyroxine , she has a few drugs with her from [place].  She is taking mirtazepaine 7.5 mg and also namenda  10 mg once daily. She is no longer using lexapro .  She has one other rx  which I am not really able to identify- I don't think it is available in the [place]   She reports a history of memory problems, she does feel like the namenda  has helped her.  They report she was tested and told she did not have alzhemier disease while in [place].  She also did have neuropsych testing through [facility] a few years ago which dx depression and anxiety    Lab Results  Component Value Date   TSH <0.01 (L) 02/15/2024   I decreased her levothyroxine  to 25 mcg at her last visit-we can recheck this  today  Discussed the use of AI scribe software for clinical note transcription with the patient, who gave verbal consent to proceed.  History of Present Illness      Patient Active Problem List   Diagnosis Date Noted   History of Helicobacter pylori infection 10/23/2022   Anemia, unspecified 10/23/2022   Mixed stress and urge urinary incontinence 06/23/2018   Follow-up-----------------PCP NOTES 12/15/2014   Memory deficit ? 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   Hypothyroidism 02/03/2007   Anxiety and depression 10/28/2006    Past Medical History:  Diagnosis Date   Allergy    Anal fissure    Anemia, unspecified    Anxiety    Colon polyps    BENIGN   CTS (carpal tunnel syndrome)    question of   Depression    Dysplasia of cervix, low grade (CIN 1)    h/o CIN-1/CIN2   History of Helicobacter pylori infection    Hypothyroidism    Osteopenia    as reported by pt, DEXA @ gyn   Pneumonia    Prediabetes 09/01/2012   Vitamin D  deficiency     Past Surgical History:  Procedure Laterality Date  APPENDECTOMY     CARPAL TUNNEL RELEASE Bilateral 09/11/2017   CHOLECYSTECTOMY  2010   CRYOTHERAPY     FOR CERVICAL DYSPLASIA CIN-1/CIN-2   TONSILLECTOMY     uterine polyps  2003   Dr.Fernadez/ RESECTOSCOPIC POLYPECTOMY    Social History[1]  Family History  Problem Relation Age of Onset   Hypertension Mother    Obesity Mother    Dementia Father        died at age 60   Osteoporosis Sister    Osteoporosis Sister    Diabetes Sister    Diabetes Brother        uncles, GM   Hypertension Brother    Breast cancer Other        distant cousins   Kidney disease Niece     Heart attack Neg Hx    Colon cancer Neg Hx     Allergies[2]  Medication list has been reviewed and updated.  Medications Ordered Prior to Encounter[3]  Review of Systems:  As per HPI- otherwise negative.   Physical Examination: There were no vitals filed for this visit. There were no vitals filed for this visit. There is no height or weight on file to calculate BMI. Ideal Body Weight:    GEN: no acute distress. HEENT: Atraumatic, Normocephalic.  Ears and Nose: No external deformity. CV: RRR, No M/G/R. No JVD. No thrill. No extra heart sounds. PULM: CTA B, no wheezes, crackles, rhonchi. No retractions. No resp. distress. No accessory muscle use. ABD: S, NT, ND, +BS. No rebound. No HSM. EXTR: No c/c/e PSYCH: Normally interactive. Conversant.    Assessment and Plan: No diagnosis found.  Assessment & Plan   Signed Harlene Schroeder, MD    [1]  Social History Tobacco Use   Smoking status: Never   Smokeless tobacco: Never  Vaping Use   Vaping status: Never Used  Substance Use Topics   Alcohol use: No    Comment:     Drug use: No  [2]  Allergies Allergen Reactions   Suvorexant  Other (See Comments)    Nightmares   Zolpidem  Other (See Comments)    Nightmares   Ceftriaxone Sodium     REACTION: nausea   Cephalosporins Nausea And Vomiting   Codeine  Other (See Comments)    Pt states she was hallucinating and talking out of her head.    Sulfonamide Derivatives     REACTION: near syncope, N\T\V  [3]  Current Outpatient Medications on File Prior to Visit  Medication Sig Dispense Refill   Calcium Carbonate-Vitamin D  (CALCIUM + D PO) Take 1 tablet by mouth daily.      estradiol  (ESTRACE ) 1 MG tablet Take 1 mg by mouth daily.     fexofenadine (ALLEGRA) 180 MG tablet Take 180 mg by mouth daily.     fish oil-omega-3 fatty acids 1000 MG capsule Take 2 g by mouth daily.     levothyroxine  (SYNTHROID ) 25 MCG tablet Take 1 tablet (25 mcg total) by mouth daily. 30 tablet 3    levothyroxine  (SYNTHROID ) 50 MCG tablet Take 1 tablet (50 mcg total) by mouth daily before breakfast. 90 tablet 1   medroxyPROGESTERone  (PROVERA ) 2.5 MG tablet Take 2.5 mg by mouth daily.     memantine  (NAMENDA ) 10 MG tablet Take 1 tablet (10 mg total) by mouth daily. 90 tablet 3   mirtazapine  (REMERON ) 7.5 MG tablet Take 1 tablet (7.5 mg total) by mouth at bedtime. 90 tablet 3   No current facility-administered medications on file prior to  visit.

## 2024-03-21 ENCOUNTER — Other Ambulatory Visit (INDEPENDENT_AMBULATORY_CARE_PROVIDER_SITE_OTHER): Payer: Self-pay

## 2024-03-21 DIAGNOSIS — Z23 Encounter for immunization: Secondary | ICD-10-CM

## 2024-03-21 LAB — HM MAMMOGRAPHY

## 2024-03-23 ENCOUNTER — Encounter: Payer: Self-pay | Admitting: Family Medicine

## 2024-03-23 ENCOUNTER — Ambulatory Visit: Admitting: Family Medicine

## 2024-03-23 VITALS — BP 130/82 | HR 80 | Ht <= 58 in | Wt 128.6 lb

## 2024-03-23 DIAGNOSIS — R413 Other amnesia: Secondary | ICD-10-CM

## 2024-03-23 DIAGNOSIS — E039 Hypothyroidism, unspecified: Secondary | ICD-10-CM | POA: Diagnosis not present

## 2024-03-23 DIAGNOSIS — F419 Anxiety disorder, unspecified: Secondary | ICD-10-CM | POA: Diagnosis not present

## 2024-03-23 DIAGNOSIS — F32A Depression, unspecified: Secondary | ICD-10-CM

## 2024-03-23 LAB — TSH: TSH: 10.27 u[IU]/mL — ABNORMAL HIGH (ref 0.35–5.50)

## 2024-03-23 MED ORDER — MEMANTINE HCL 10 MG PO TABS: 10.0000 mg | ORAL_TABLET | Freq: Every day | ORAL | 3 refills | Status: AC

## 2024-03-23 MED ORDER — MIRTAZAPINE 7.5 MG PO TABS: 7.5000 mg | ORAL_TABLET | Freq: Every day | ORAL | 3 refills | Status: AC

## 2024-03-25 ENCOUNTER — Other Ambulatory Visit: Payer: Self-pay | Admitting: Family Medicine

## 2024-03-25 ENCOUNTER — Encounter: Payer: Self-pay | Admitting: Family Medicine

## 2024-03-25 DIAGNOSIS — E039 Hypothyroidism, unspecified: Secondary | ICD-10-CM

## 2024-03-25 MED ORDER — LEVOTHYROXINE SODIUM 50 MCG PO TABS: 50.0000 ug | ORAL_TABLET | Freq: Every day | ORAL | 1 refills | Status: AC

## 2024-04-05 ENCOUNTER — Encounter: Payer: Self-pay | Admitting: Internal Medicine

## 2024-04-05 ENCOUNTER — Telehealth: Payer: Self-pay

## 2024-04-05 NOTE — Telephone Encounter (Signed)
 LMOM asking to see if Pt can come today, 04/05/24 at 2:40pm instead of tomorrow, 04/06/24- asking for call back.

## 2024-04-06 ENCOUNTER — Ambulatory Visit (INDEPENDENT_AMBULATORY_CARE_PROVIDER_SITE_OTHER): Admitting: Internal Medicine

## 2024-04-06 VITALS — BP 131/58 | HR 82 | Temp 98.2°F | Resp 16 | Ht <= 58 in | Wt 128.8 lb

## 2024-04-06 DIAGNOSIS — R1012 Left upper quadrant pain: Secondary | ICD-10-CM | POA: Diagnosis not present

## 2024-04-06 LAB — CBC WITH DIFFERENTIAL/PLATELET
Absolute Lymphocytes: 1337 {cells}/uL (ref 850–3900)
Absolute Monocytes: 241 {cells}/uL (ref 200–950)
Basophils Absolute: 30 {cells}/uL (ref 0–200)
Basophils Relative: 0.7 %
Eosinophils Absolute: 168 {cells}/uL (ref 15–500)
Eosinophils Relative: 3.9 %
HCT: 39.4 % (ref 35.9–46.0)
Hemoglobin: 13.1 g/dL (ref 11.7–15.5)
MCH: 31.1 pg (ref 27.0–33.0)
MCHC: 33.2 g/dL (ref 31.6–35.4)
MCV: 93.6 fL (ref 81.4–101.7)
MPV: 10.7 fL (ref 7.5–12.5)
Monocytes Relative: 5.6 %
Neutro Abs: 2524 {cells}/uL (ref 1500–7800)
Neutrophils Relative %: 58.7 %
Platelets: 214 Thousand/uL (ref 140–400)
RBC: 4.21 Million/uL (ref 3.80–5.10)
RDW: 13.8 % (ref 11.0–15.0)
Total Lymphocyte: 31.1 %
WBC: 4.3 Thousand/uL (ref 3.8–10.8)

## 2024-04-06 LAB — COMPREHENSIVE METABOLIC PANEL WITH GFR
AG Ratio: 1.6 (calc) (ref 1.0–2.5)
ALT: 16 U/L (ref 6–29)
AST: 17 U/L (ref 10–35)
Albumin: 4.2 g/dL (ref 3.6–5.1)
Alkaline phosphatase (APISO): 83 U/L (ref 37–153)
BUN: 10 mg/dL (ref 7–25)
CO2: 28 mmol/L (ref 20–32)
Calcium: 9.4 mg/dL (ref 8.6–10.4)
Chloride: 108 mmol/L (ref 98–110)
Creat: 0.63 mg/dL (ref 0.50–1.05)
Globulin: 2.6 g/dL (ref 1.9–3.7)
Glucose, Bld: 113 mg/dL — ABNORMAL HIGH (ref 65–99)
Potassium: 4.4 mmol/L (ref 3.5–5.3)
Sodium: 142 mmol/L (ref 135–146)
Total Bilirubin: 0.7 mg/dL (ref 0.2–1.2)
Total Protein: 6.8 g/dL (ref 6.1–8.1)
eGFR: 97 mL/min/1.73m2

## 2024-04-06 NOTE — Assessment & Plan Note (Signed)
 Chronic LUQ abdominal pain Chronic left upper quadrant abdominal pain, exacerbated in the last 2 weeks, now also with some RUQ abdominal discomfort. Had extensive evaluation before, see my office visit note from 05/2023. Last colonoscopy was in 2024. Had an ultrasound of the abdomen 05/25/2023 with no acute findings. The review of system is in benign, physical exam is benign except for mild tenderness. Advised patient that I do not see any acute or emergent issue at this point.  For completeness we will get a CMP and CBC. Advised emergency care for severe symptoms such as severe pain, fever, chills, or hematochezia. MCI? 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 Hypothyroidism Recent TSH level of 10.2, Synthroid  increased from 25 mcg to 50 mcg daily which she is taking. No constipation symptoms. Medication adherence confirmed. Will recheck a TSH when she come back for a visit on February 10th.

## 2024-04-06 NOTE — Patient Instructions (Signed)
 GO TO THE LAB :  Get the blood work     Stomach pain: You have chronic left upper quadrant abdominal pain that worsens after meals and has recently spread to the right upper quadrant. Previous evaluations were unremarkable except for a fatty liver. -We have ordered blood work to check for anemia and liver function. -Seek emergency care if you experience severe pain, fever, chills, or blood in your stool.  HYPOTHYROIDISM: Your recent TSH level indicates suboptimal thyroid  function. You are currently taking levothyroxine  50 mcg daily. -We have scheduled a thyroid  function test for February 10th. -Continue taking levothyroxine  50 mcg daily as prescribed.

## 2024-04-06 NOTE — Progress Notes (Signed)
 "  Subjective:    Patient ID: Jamie Patrick, female    DOB: 04/12/1955, 69 y.o.   MRN: [id number]  DOS:  04/06/2024 Acute visit Discussed the use of AI scribe software for clinical note transcription with the patient, who gave verbal consent to proceed.  History of Present Illness Jamie Patrick is a 68 year old female who presents with left upper quadrant pain.  Abdominal pain - Burning pain localized to the left upper quadrant, onset 20-30 minutes after lunch or dinner, persisting for more than two weeks - Pain is worse at night - New right upper quadrant pain developed today - No associated fever, chills, nausea, vomiting, blood in stools, or recent weight loss - No heartburn, constipation, or change in stool color; stools remain normal in color - No dysuria, hematuria, or urinary difficulty - Takes Tylenol  500 mg, two tablets at night for the past 10 days for pain relief.  Not taking NSAIDs  Gastrointestinal and hepatic history - Previously evaluated by gastroenterology - Prior CT imaging performed   - Abdominal ultrasound on May 25, 2023, showed fatty liver  Thyroid  dysfunction - Currently taking levothyroxine  50 mcg daily since December 17 after TSH of 10.2      Review of Systems See above   Past Medical History:  Diagnosis Date   Allergy    Anal fissure    Anemia, unspecified    Anxiety    Colon polyps    BENIGN   CTS (carpal tunnel syndrome)    question of   Depression    Dysplasia of cervix, low grade (CIN 1)    h/o CIN-1/CIN2   History of Helicobacter pylori infection    Hypothyroidism    Osteopenia    as reported by pt, DEXA @ gyn   Pneumonia    Prediabetes 09/01/2012   Vitamin D  deficiency     Past Surgical History:  Procedure Laterality Date   APPENDECTOMY     CARPAL TUNNEL RELEASE Bilateral 09/11/2017   CHOLECYSTECTOMY  2010   CRYOTHERAPY     FOR CERVICAL DYSPLASIA CIN-1/CIN-2   TONSILLECTOMY     uterine polyps  2003    Dr.Fernadez/ RESECTOSCOPIC POLYPECTOMY    Current Outpatient Medications  Medication Instructions   estradiol  (ESTRACE ) 1 mg, Daily   fexofenadine (ALLEGRA) 180 mg, Daily   fish oil-omega-3 fatty acids 2 g, Daily   levothyroxine  (SYNTHROID ) 50 mcg, Oral, Daily   medroxyPROGESTERone  (PROVERA ) 2.5 mg, Daily   memantine  (NAMENDA ) 10 mg, Oral, Daily   mirtazapine  (REMERON ) 7.5 mg, Oral, Daily at bedtime       Objective:   Physical Exam BP (!) 131/58 (BP Location: Right Arm, Patient Position: Sitting, Cuff Size: Small)   Pulse 82   Temp 98.2 F (36.8 C) (Oral)   Resp 16   Ht 4' 8.5 (1.435 m)   Wt 128 lb 12.8 oz (58.4 kg)   LMP 02/03/2009   SpO2 99%   BMI 28.37 kg/m  General:   Well developed, NAD, BMI noted.  HEENT:  Normocephalic . Face symmetric, atraumatic Lungs:  CTA B Normal respiratory effort, no intercostal retractions, no accessory muscle use. Heart: RRR,  no murmur.  Abdomen:  Not distended, soft, minimal tenderness to palpation at the upper abdomen bilaterally without mass or rebound Skin: Not pale. Not jaundice Lower extremities: no pretibial edema bilaterally  Neurologic:  alert & oriented X3.  Speech normal, gait appropriate for age and unassisted Psych--  Cognition and judgment appear  intact.  Cooperative with normal attention span and concentration.  Behavior appropriate. No anxious or depressed appearing.     Assessment   Assessment  Prediabetes Hypothyroidism Anxiety , depression , insomnia, as of 10/2022 Dr Maurice intolerant to Abilify,Viibrid, used to see Dr Vincente trazadone (didn't work), elavil  (stopped working), ambien  (++s/e). 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  C-scope 12/2021 WNL.  Assessment & Plan Chronic LUQ  abdominal pain Chronic left upper quadrant abdominal pain, exacerbated in the last 2 weeks, now also with some RUQ abdominal discomfort. Had extensive evaluation before, see my office visit note from 05/2023. Last colonoscopy was in 2024. Had an ultrasound of the abdomen 05/25/2023 with no acute findings. The review of system is in benign, physical exam is benign except for mild tenderness. Advised patient that I do not see any acute or emergent issue at this point.  For completeness we will get a CMP and CBC. Advised emergency care for severe symptoms such as severe pain, fever, chills, or hematochezia. MCI? 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 Hypothyroidism Recent TSH level of 10.2, Synthroid  increased from 25 mcg to 50 mcg daily which she is taking. No constipation symptoms. Medication adherence confirmed. Will recheck a TSH when she come back for a visit on February 10th.       "

## 2024-04-08 ENCOUNTER — Ambulatory Visit: Payer: Self-pay | Admitting: Internal Medicine

## 2024-04-15 ENCOUNTER — Encounter: Payer: Self-pay | Admitting: Internal Medicine

## 2024-04-15 DIAGNOSIS — F32A Depression, unspecified: Secondary | ICD-10-CM

## 2024-04-21 ENCOUNTER — Ambulatory Visit: Admitting: Psychiatry

## 2024-05-02 ENCOUNTER — Encounter: Payer: Self-pay | Admitting: Internal Medicine

## 2024-05-02 DIAGNOSIS — K760 Fatty (change of) liver, not elsewhere classified: Secondary | ICD-10-CM

## 2024-05-10 ENCOUNTER — Other Ambulatory Visit

## 2024-05-12 ENCOUNTER — Other Ambulatory Visit

## 2024-05-12 ENCOUNTER — Encounter: Payer: Self-pay | Admitting: Family Medicine

## 2024-05-12 DIAGNOSIS — E039 Hypothyroidism, unspecified: Secondary | ICD-10-CM | POA: Diagnosis not present

## 2024-05-12 LAB — TSH: TSH: 0.92 u[IU]/mL (ref 0.35–5.50)

## 2024-05-16 ENCOUNTER — Ambulatory Visit: Admitting: Physician Assistant

## 2024-05-17 ENCOUNTER — Ambulatory Visit

## 2024-05-17 ENCOUNTER — Encounter: Admitting: Internal Medicine

## 2024-07-06 ENCOUNTER — Ambulatory Visit (HOSPITAL_COMMUNITY): Payer: Self-pay | Admitting: Psychiatry

## 2024-07-26 ENCOUNTER — Ambulatory Visit: Admitting: "Endocrinology
